# Patient Record
Sex: Male | Born: 1935
Health system: Southern US, Community
[De-identification: ages and names within clinical notes are randomized; demographics above are authoritative.]

## PROBLEM LIST (undated history)

## (undated) DIAGNOSIS — G4730 Sleep apnea, unspecified: Secondary | ICD-10-CM

## (undated) DIAGNOSIS — M545 Low back pain, unspecified: Secondary | ICD-10-CM

## (undated) DIAGNOSIS — E7800 Pure hypercholesterolemia, unspecified: Secondary | ICD-10-CM

## (undated) DIAGNOSIS — I10 Essential (primary) hypertension: Secondary | ICD-10-CM

## (undated) DIAGNOSIS — I5022 Chronic systolic (congestive) heart failure: Secondary | ICD-10-CM

## (undated) DIAGNOSIS — G8929 Other chronic pain: Secondary | ICD-10-CM

## (undated) DIAGNOSIS — J449 Chronic obstructive pulmonary disease, unspecified: Secondary | ICD-10-CM

## (undated) DIAGNOSIS — R51 Headache: Secondary | ICD-10-CM

## (undated) DIAGNOSIS — Z720 Tobacco use: Secondary | ICD-10-CM

## (undated) DIAGNOSIS — IMO0002 Reserved for concepts with insufficient information to code with codable children: Secondary | ICD-10-CM

## (undated) DIAGNOSIS — K8020 Calculus of gallbladder without cholecystitis without obstruction: Secondary | ICD-10-CM

## (undated) DIAGNOSIS — M1990 Unspecified osteoarthritis, unspecified site: Secondary | ICD-10-CM

## (undated) DIAGNOSIS — E871 Hypo-osmolality and hyponatremia: Secondary | ICD-10-CM

## (undated) DIAGNOSIS — I4821 Permanent atrial fibrillation: Secondary | ICD-10-CM

## (undated) DIAGNOSIS — I110 Hypertensive heart disease with heart failure: Secondary | ICD-10-CM

## (undated) DIAGNOSIS — F419 Anxiety disorder, unspecified: Secondary | ICD-10-CM

## (undated) DIAGNOSIS — N182 Chronic kidney disease, stage 2 (mild): Secondary | ICD-10-CM

## (undated) DIAGNOSIS — I2510 Atherosclerotic heart disease of native coronary artery without angina pectoris: Secondary | ICD-10-CM

## (undated) DIAGNOSIS — E039 Hypothyroidism, unspecified: Secondary | ICD-10-CM

## (undated) DIAGNOSIS — I071 Rheumatic tricuspid insufficiency: Secondary | ICD-10-CM

## (undated) DIAGNOSIS — N529 Male erectile dysfunction, unspecified: Secondary | ICD-10-CM

## (undated) DIAGNOSIS — R519 Headache, unspecified: Secondary | ICD-10-CM

## (undated) HISTORY — DX: Tobacco use: Z72.0

## (undated) HISTORY — PX: TONSILLECTOMY: SUR1361

## (undated) HISTORY — DX: Permanent atrial fibrillation: I48.21

## (undated) HISTORY — DX: Rheumatic tricuspid insufficiency: I07.1

## (undated) HISTORY — DX: Chronic obstructive pulmonary disease, unspecified: J44.9

## (undated) HISTORY — DX: Chronic systolic (congestive) heart failure: I50.22

## (undated) HISTORY — DX: Hypothyroidism, unspecified: E03.9

## (undated) HISTORY — PX: CATARACT EXTRACTION, BILATERAL: SHX1313

## (undated) HISTORY — DX: Sleep apnea, unspecified: G47.30

## (undated) HISTORY — DX: Chronic kidney disease, stage 2 (mild): N18.2

## (undated) HISTORY — DX: Pure hypercholesterolemia, unspecified: E78.00

## (undated) HISTORY — DX: Hypertensive heart disease with heart failure: I11.0

## (undated) HISTORY — PX: APPENDECTOMY: SHX54

## (undated) HISTORY — DX: Hypo-osmolality and hyponatremia: E87.1

## (undated) HISTORY — DX: Calculus of gallbladder without cholecystitis without obstruction: K80.20

## (undated) HISTORY — DX: Essential (primary) hypertension: I10

## (undated) HISTORY — DX: Atherosclerotic heart disease of native coronary artery without angina pectoris: I25.10

## (undated) HISTORY — DX: Male erectile dysfunction, unspecified: N52.9

## (undated) HISTORY — DX: Reserved for concepts with insufficient information to code with codable children: IMO0002

---

## 1985-05-01 HISTORY — PX: CARDIAC CATHETERIZATION: SHX172

## 1997-09-09 ENCOUNTER — Other Ambulatory Visit: Admission: RE | Admit: 1997-09-09 | Discharge: 1997-09-09 | Payer: Self-pay | Admitting: Cardiology

## 2003-11-11 ENCOUNTER — Ambulatory Visit (HOSPITAL_COMMUNITY): Admission: RE | Admit: 2003-11-11 | Discharge: 2003-11-11 | Payer: Self-pay | Admitting: Gastroenterology

## 2005-11-15 ENCOUNTER — Encounter: Admission: RE | Admit: 2005-11-15 | Discharge: 2005-11-15 | Payer: Self-pay | Admitting: Cardiology

## 2005-11-23 ENCOUNTER — Encounter: Admission: RE | Admit: 2005-11-23 | Discharge: 2005-11-23 | Payer: Self-pay | Admitting: Cardiology

## 2006-04-20 ENCOUNTER — Encounter: Admission: RE | Admit: 2006-04-20 | Discharge: 2006-04-20 | Payer: Self-pay | Admitting: Cardiology

## 2008-02-11 ENCOUNTER — Encounter: Admission: RE | Admit: 2008-02-11 | Discharge: 2008-02-11 | Payer: Self-pay | Admitting: Cardiology

## 2008-12-30 DIAGNOSIS — E871 Hypo-osmolality and hyponatremia: Secondary | ICD-10-CM

## 2008-12-30 HISTORY — DX: Hypo-osmolality and hyponatremia: E87.1

## 2009-01-24 ENCOUNTER — Inpatient Hospital Stay (HOSPITAL_COMMUNITY): Admission: EM | Admit: 2009-01-24 | Discharge: 2009-01-26 | Payer: Self-pay | Admitting: Emergency Medicine

## 2009-01-25 ENCOUNTER — Encounter (INDEPENDENT_AMBULATORY_CARE_PROVIDER_SITE_OTHER): Payer: Self-pay | Admitting: Emergency Medicine

## 2009-06-29 ENCOUNTER — Encounter: Admission: RE | Admit: 2009-06-29 | Discharge: 2009-06-29 | Payer: Self-pay | Admitting: Cardiology

## 2009-12-16 ENCOUNTER — Ambulatory Visit: Payer: Self-pay | Admitting: Cardiology

## 2009-12-20 ENCOUNTER — Ambulatory Visit: Payer: Self-pay | Admitting: Cardiology

## 2010-03-23 ENCOUNTER — Ambulatory Visit: Payer: Self-pay | Admitting: Cardiology

## 2010-07-20 ENCOUNTER — Other Ambulatory Visit: Payer: Self-pay

## 2010-07-22 ENCOUNTER — Ambulatory Visit: Payer: Self-pay | Admitting: Cardiology

## 2010-08-01 ENCOUNTER — Other Ambulatory Visit (INDEPENDENT_AMBULATORY_CARE_PROVIDER_SITE_OTHER): Payer: Medicare Other | Admitting: *Deleted

## 2010-08-01 DIAGNOSIS — E7800 Pure hypercholesterolemia, unspecified: Secondary | ICD-10-CM

## 2010-08-01 LAB — COMPREHENSIVE METABOLIC PANEL
Albumin: 3.6 g/dL (ref 3.5–5.2)
Alkaline Phosphatase: 82 U/L (ref 39–117)
BUN: 26 mg/dL — ABNORMAL HIGH (ref 6–23)
Creatinine, Ser: 1 mg/dL (ref 0.4–1.5)
Glucose, Bld: 90 mg/dL (ref 70–99)
Total Bilirubin: 0.8 mg/dL (ref 0.3–1.2)

## 2010-08-01 LAB — LIPID PANEL
Cholesterol: 151 mg/dL (ref 0–200)
HDL: 42.4 mg/dL (ref >39.00)
Total CHOL/HDL Ratio: 4
Triglycerides: 104 mg/dL (ref 0.0–149.0)

## 2010-08-02 ENCOUNTER — Encounter: Payer: Self-pay | Admitting: Cardiology

## 2010-08-03 ENCOUNTER — Ambulatory Visit (INDEPENDENT_AMBULATORY_CARE_PROVIDER_SITE_OTHER): Payer: Medicare Other | Admitting: Cardiology

## 2010-08-03 ENCOUNTER — Encounter: Payer: Self-pay | Admitting: Cardiology

## 2010-08-03 DIAGNOSIS — K8020 Calculus of gallbladder without cholecystitis without obstruction: Secondary | ICD-10-CM | POA: Insufficient documentation

## 2010-08-03 DIAGNOSIS — I259 Chronic ischemic heart disease, unspecified: Secondary | ICD-10-CM

## 2010-08-03 DIAGNOSIS — N529 Male erectile dysfunction, unspecified: Secondary | ICD-10-CM | POA: Insufficient documentation

## 2010-08-03 DIAGNOSIS — I119 Hypertensive heart disease without heart failure: Secondary | ICD-10-CM

## 2010-08-03 DIAGNOSIS — Z720 Tobacco use: Secondary | ICD-10-CM | POA: Insufficient documentation

## 2010-08-03 DIAGNOSIS — M545 Low back pain, unspecified: Secondary | ICD-10-CM

## 2010-08-03 DIAGNOSIS — I110 Hypertensive heart disease with heart failure: Secondary | ICD-10-CM | POA: Insufficient documentation

## 2010-08-03 DIAGNOSIS — F17200 Nicotine dependence, unspecified, uncomplicated: Secondary | ICD-10-CM

## 2010-08-03 DIAGNOSIS — E7800 Pure hypercholesterolemia, unspecified: Secondary | ICD-10-CM | POA: Insufficient documentation

## 2010-08-03 NOTE — Assessment & Plan Note (Signed)
He was recently found to have asymptomatic cholelithiasis on her recent hospitalization at [facility] when he had a CT angiogram of the chest.  He was found to have emphysema.  He was also noted to have extensive coronary artery calcification.

## 2010-08-03 NOTE — Assessment & Plan Note (Signed)
The patient continues to smoke a pack of cigarettes a day.  Unfortunately he is smoking more now that he is retired.  He does have symptoms of leg weakness and easy fatigue which could be directly related to his long history of tobacco abuse.  We talked about ways to quit smoking he will consider the Nicorette gum or nicotine patches.

## 2010-08-03 NOTE — Assessment & Plan Note (Signed)
The patient has a history of remote inferior wall MI.  He has not been experiencing any chest pain to suggest angina pectoris.  He has not been expressing any symptoms of congestive heart failure.

## 2010-08-03 NOTE — Progress Notes (Signed)
History of Present Illness: This 77 year old gentleman is seen for a scheduled followup office visit.  He has a history of known ischemic heart disease with a previous inferior wall myocardial infarction.  His last nuclear stress test was 11/16/04 which showed an old inferolateral scar but no reversible ischemia and his ejection fraction was 41%.  The patient had an echocardiogram on 01/25/09 when he was hospitalized at [facility] and the echocardiogram showed an ejection fraction of 40-45% with hypokinesis of the inferior myocardium as well as grade 1 diastolic dysfunction and moderate left atrial dilatation.  Current Outpatient Prescriptions  Medication Sig Dispense Refill  . Acetaminophen (TYLENOL PO) Take by mouth as needed.        Marland Kitchen amLODipine (NORVASC) 5 MG tablet Take 5 mg by mouth daily.       Marland Kitchen aspirin 162 MG EC tablet Take 162 mg by mouth daily.        Marland Kitchen atorvastatin (LIPITOR) 10 MG tablet Take 10 mg by mouth daily.       . hydrochlorothiazide (,MICROZIDE/HYDRODIURIL,) 12.5 MG capsule Take 12.5 mg by mouth every other day.       . Ibuprofen (ADVIL PO) Take by mouth as needed.        Marland Kitchen lisinopril (PRINIVIL,ZESTRIL) 40 MG tablet Take 40 mg by mouth daily.       Marland Kitchen LORazepam (ATIVAN) 0.5 MG tablet Take 0.5 mg by mouth daily.        . metoprolol tartrate (LOPRESSOR) 25 MG tablet Take 25 mg by mouth 2 (two) times daily.       . Naproxen Sodium (ALEVE PO) Take by mouth as needed.        . Sildenafil Citrate (VIAGRA PO) Take by mouth as needed.          Allergies  Allergen Reactions  . Ceclor (Cefaclor)   . Erythromycin     Patient Active Problem List  Diagnoses  . Ischemic heart disease  . Tobacco abuse  . Benign hypertensive heart disease without heart failure  . Hypercholesterolemia  . Erectile dysfunction  . Low back pain  . Cholelithiasis    History  Smoking status  . Current Everyday Smoker -- 1.0 packs/day  Smokeless tobacco  . Not on file    History    Alcohol Use  . Yes    No family history on file.  Review of Systems: Constitutional: no fever chills diaphoresis or fatigue or change in weight.  Head and neck: no hearing loss, no epistaxis, no photophobia or visual disturbance. Respiratory: The patient does have a smoker's cough and a lot of congestion and phlegm Cardiovascular: No chest pain peripheral edema, palpitations. Gastrointestinal: No abdominal distention, no abdominal pain, no change in bowel habits hematochezia or melena. Genitourinary: No dysuria, no frequency, no urgency, no nocturia. Musculoskeletal:No arthralgias, no back pain, no gait disturbance or myalgias. Neurological: No dizziness, no headaches, no numbness, no seizures, no syncope, no weakness, no tremors. Hematologic: No lymphadenopathy, no easy bruising. Psychiatric: No confusion, no hallucinations, no sleep disturbance.    Physical Exam: Filed Vitals:   08/03/10 1142  BP: 136/78  Pulse: 64  His weight is 153, down 3 pounds.  The general appearance reveals a well-developed well-nourished gentleman in no distress.  He has some kyphosis of the spine.Pupils equal and reactive.   Extraocular Movements are full.  There is no scleral icterus.  The mouth and pharynx are normal.  The neck is supple.  The carotids reveal no  bruits.  The jugular venous pressure is normal.  The thyroid is not enlarged.  There is no lymphadenopathy.The chest is clear to percussion and auscultation. There are no rales or rhonchi. Expansion of the chest is symmetrical.The precordium is quiet.  The first heart sound is normal.  The second heart sound is physiologically split.  There is no murmur gallop rub or click.  There is no abnormal lift or heave.The abdomen is soft and nontender. Bowel sounds are normal. The liver and spleen are not enlarged. There Are no abdominal masses. There are no bruits.  Extremities revealed decreased pedal pulses.  No edema or phlebitis.   Assessment /  Plan: Patient is to work harder on quitting smoking.  Continue same meds

## 2010-08-03 NOTE — Assessment & Plan Note (Signed)
His urologist has told him that his smoking is a contributory factor to his ED problem and I concur with that.

## 2010-08-03 NOTE — Assessment & Plan Note (Signed)
We reviewed his blood work from 2 days ago which shows adequate response to Lipitor.  Continue same medication.

## 2010-08-03 NOTE — Assessment & Plan Note (Signed)
No headaches or dizzy spells.  No syncope.  No palpitations.

## 2010-08-05 LAB — CBC
Hemoglobin: 12.7 g/dL — ABNORMAL LOW (ref 13.0–17.0)
Hemoglobin: 13.4 g/dL (ref 13.0–17.0)
Hemoglobin: 13.6 g/dL (ref 13.0–17.0)
Hemoglobin: 13.7 g/dL (ref 13.0–17.0)
MCHC: 34.4 g/dL (ref 30.0–36.0)
MCHC: 34.5 g/dL (ref 30.0–36.0)
MCHC: 35 g/dL (ref 30.0–36.0)
MCV: 104.3 fL — ABNORMAL HIGH (ref 78.0–100.0)
Platelets: 281 10*3/uL (ref 150–400)
RBC: 3.51 MIL/uL — ABNORMAL LOW (ref 4.22–5.81)
RBC: 3.74 MIL/uL — ABNORMAL LOW (ref 4.22–5.81)
RBC: 3.78 MIL/uL — ABNORMAL LOW (ref 4.22–5.81)
RBC: 3.83 MIL/uL — ABNORMAL LOW (ref 4.22–5.81)
RDW: 13.5 % (ref 11.5–15.5)
WBC: 12.4 10*3/uL — ABNORMAL HIGH (ref 4.0–10.5)
WBC: 13 10*3/uL — ABNORMAL HIGH (ref 4.0–10.5)
WBC: 16.3 10*3/uL — ABNORMAL HIGH (ref 4.0–10.5)

## 2010-08-05 LAB — DIFFERENTIAL
Basophils Absolute: 0 10*3/uL (ref 0.0–0.1)
Basophils Relative: 0 % (ref 0–1)
Basophils Relative: 0 % (ref 0–1)
Eosinophils Absolute: 0 10*3/uL (ref 0.0–0.7)
Eosinophils Relative: 0 % (ref 0–5)
Lymphocytes Relative: 13 % (ref 12–46)
Lymphocytes Relative: 16 % (ref 12–46)
Lymphs Abs: 1.6 10*3/uL (ref 0.7–4.0)
Lymphs Abs: 1.9 10*3/uL (ref 0.7–4.0)
Monocytes Absolute: 1.1 10*3/uL — ABNORMAL HIGH (ref 0.1–1.0)
Monocytes Relative: 9 % (ref 3–12)
Neutro Abs: 9.3 10*3/uL — ABNORMAL HIGH (ref 1.7–7.7)
Neutro Abs: 9.8 10*3/uL — ABNORMAL HIGH (ref 1.7–7.7)
Neutrophils Relative %: 72 % (ref 43–77)
Neutrophils Relative %: 76 % (ref 43–77)

## 2010-08-05 LAB — URINALYSIS, ROUTINE W REFLEX MICROSCOPIC
Bilirubin Urine: NEGATIVE
Glucose, UA: NEGATIVE mg/dL
Hgb urine dipstick: NEGATIVE
Ketones, ur: NEGATIVE mg/dL
Protein, ur: NEGATIVE mg/dL
Specific Gravity, Urine: 1.012 (ref 1.005–1.030)

## 2010-08-05 LAB — BASIC METABOLIC PANEL
CO2: 23 mEq/L (ref 19–32)
CO2: 26 mEq/L (ref 19–32)
CO2: 27 mEq/L (ref 19–32)
Calcium: 8.9 mg/dL (ref 8.4–10.5)
Chloride: 95 mEq/L — ABNORMAL LOW (ref 96–112)
Creatinine, Ser: 0.95 mg/dL (ref 0.4–1.5)
GFR calc Af Amer: >60 mL/min (ref >60)
GFR calc Af Amer: >60 mL/min (ref >60)
Glucose, Bld: 118 mg/dL — ABNORMAL HIGH (ref 70–99)
Potassium: 4.7 mEq/L (ref 3.5–5.1)
Sodium: 125 mEq/L — ABNORMAL LOW (ref 135–145)
Sodium: 128 mEq/L — ABNORMAL LOW (ref 135–145)
Sodium: 130 mEq/L — ABNORMAL LOW (ref 135–145)

## 2010-08-05 LAB — TSH: TSH: 1.075 u[IU]/mL (ref 0.350–4.500)

## 2010-08-05 LAB — D-DIMER, QUANTITATIVE: D-Dimer, Quant: 0.56 ug/mL-FEU — ABNORMAL HIGH (ref 0.00–0.48)

## 2010-08-05 LAB — CARDIAC PANEL(CRET KIN+CKTOT+MB+TROPI)
CK, MB: 1.4 ng/mL (ref 0.3–4.0)
CK, MB: 1.5 ng/mL (ref 0.3–4.0)
Relative Index: INVALID (ref 0.0–2.5)
Relative Index: INVALID (ref 0.0–2.5)
Total CK: 32 U/L (ref 7–232)
Total CK: 33 U/L (ref 7–232)
Troponin I: 0.01 ng/mL (ref 0.00–0.06)
Troponin I: 0.02 ng/mL (ref 0.00–0.06)

## 2010-08-05 LAB — BRAIN NATRIURETIC PEPTIDE: Pro B Natriuretic peptide (BNP): 246 pg/mL — ABNORMAL HIGH (ref 0.0–100.0)

## 2010-08-05 LAB — COMPREHENSIVE METABOLIC PANEL
ALT: 35 U/L (ref 0–53)
BUN: 21 mg/dL (ref 6–23)
GFR calc Af Amer: >60 mL/min (ref >60)
GFR calc non Af Amer: >60 mL/min (ref >60)
Glucose, Bld: 135 mg/dL — ABNORMAL HIGH (ref 70–99)
Potassium: 3.2 mEq/L — ABNORMAL LOW (ref 3.5–5.1)
Total Protein: 6 g/dL (ref 6.0–8.3)

## 2010-08-05 LAB — LIPID PANEL
Cholesterol: 144 mg/dL (ref 0–200)
HDL: 45 mg/dL (ref >39)

## 2010-08-05 LAB — PHOSPHORUS: Phosphorus: 2.9 mg/dL (ref 2.3–4.6)

## 2010-08-05 LAB — CORTISOL: Cortisol, Plasma: 3.3 ug/dL

## 2010-08-05 LAB — HEMOGLOBIN A1C
Hgb A1c MFr Bld: 5.7 % (ref 4.6–6.1)
Mean Plasma Glucose: 117 mg/dL

## 2010-08-05 LAB — OSMOLALITY
Osmolality: 264 mOsm/kg — ABNORMAL LOW (ref 275–300)
Osmolality: 277 mOsm/kg (ref 275–300)

## 2010-08-05 LAB — SODIUM, URINE, RANDOM: Sodium, Ur: 95 mEq/L

## 2010-08-05 LAB — POCT CARDIAC MARKERS: Myoglobin, poc: 45 ng/mL (ref 12–200)

## 2010-08-18 ENCOUNTER — Telehealth: Payer: Self-pay | Admitting: Cardiology

## 2010-08-25 ENCOUNTER — Telehealth: Payer: Self-pay | Admitting: Cardiology

## 2010-08-25 NOTE — Telephone Encounter (Signed)
Mr Christopher Butler [phone] ext [phone] [employer] wants to check status of Medical Necessity paperwork for Dr Patty Sermons

## 2010-08-25 NOTE — Telephone Encounter (Signed)
Left message that it is in Dr [place] office and would fax back when complete

## 2010-09-16 NOTE — Op Note (Signed)
NAME:  Christopher Butler, Christopher Butler                         ACCOUNT NO.:  [id number]   MEDICAL RECORD NO.:  [id number]                   PATIENT TYPE:  AMB   LOCATION:  ENDO                                 FACILITY:  [facility]   PHYSICIAN:  John C. Madilyn Fireman, M.D.                 DATE OF BIRTH:  Aug 07, 1924   DATE OF PROCEDURE:  11/11/2003  DATE OF DISCHARGE:                                 OPERATIVE REPORT   PROCEDURE:  Colonoscopy.   INDICATIONS FOR PROCEDURE:  Colon cancer screening and left lower quadrant  abdominal pain.   DESCRIPTION OF PROCEDURE:  The patient was placed in the left lateral  decubitus position then placed on the pulse monitor with continuous low flow  oxygen delivered by nasal cannula. He was sedated with 62.5 mcg IV fentanyl  and 6 mg IV Versed. The Olympus video colonoscope was inserted into the  rectum and advanced to the cecum, confirmed by transillumination at  McBurney's point and visualization of the ileocecal valve and appendiceal  orifice. The prep was excellent. The cecum, ascending, transverse,  descending and sigmoid colon all appeared normal with no masses, polyps,  diverticula or other mucosal abnormalities. The rectum likewise appeared  normal and retroflexed view of the anus revealed no obvious internal  hemorrhoids. The scope was then withdrawn and the patient returned to the  recovery room in stable condition. He tolerated the procedure well and there  were no immediate complications.   IMPRESSION:  Normal colonoscopy.   PLAN:  Next colon screening by sigmoidoscopy in five years.                                               John C. Madilyn Fireman, M.D.    [facility]  D:  11/11/2003  T:  11/11/2003  Job:  [id number]   cc:   Cassell Clement, M.D.  [address], [address]  [place]  [place] [address]  Fax: [phone]

## 2010-09-27 ENCOUNTER — Telehealth: Payer: Self-pay | Admitting: Cardiology

## 2010-09-27 NOTE — Telephone Encounter (Signed)
PT NOT SURE IF HIS BP NEEDS TO BE ADJUSTED, HIS BP HAS BEEN RUNNING LOW. CHART IN BOX.

## 2010-09-27 NOTE — Telephone Encounter (Addendum)
Returned pt call concerning low BP. He reports reading of 110/65 @ 10am today. He states he quit smoking 4 weeks ago. Feels tired and like his equilibrium is off. He wonders if his meds need adjusted since he quit smoking. Reviewed current meds with pt. Will discuss with Dr Patty Sermons and return call to him regarding meds. 1700 returned call to pt after discussing pt situation with Dr Patty Sermons. Pt will decrease metoprolol to 25mg  daily and Lisinopril to be decreased to 20mg  daily pt will be taking 1/2 a tablet of his 40mg  tablets.

## 2010-11-29 NOTE — Telephone Encounter (Signed)
No note necessary for this encounter. °

## 2010-11-30 ENCOUNTER — Ambulatory Visit (INDEPENDENT_AMBULATORY_CARE_PROVIDER_SITE_OTHER): Payer: Medicare Other | Admitting: *Deleted

## 2010-11-30 ENCOUNTER — Other Ambulatory Visit: Payer: Medicare Other | Admitting: *Deleted

## 2010-11-30 DIAGNOSIS — E7800 Pure hypercholesterolemia, unspecified: Secondary | ICD-10-CM

## 2010-11-30 LAB — BASIC METABOLIC PANEL
BUN: 21 mg/dL (ref 6–23)
CO2: 26 mEq/L (ref 19–32)
Chloride: 97 mEq/L (ref 96–112)
Glucose, Bld: 100 mg/dL — ABNORMAL HIGH (ref 70–99)
Potassium: 4.2 mEq/L (ref 3.5–5.1)
Sodium: 132 mEq/L — ABNORMAL LOW (ref 135–145)

## 2010-11-30 LAB — CBC WITH DIFFERENTIAL/PLATELET
Basophils Relative: 0.4 % (ref 0.0–3.0)
Eosinophils Absolute: 0 10*3/uL (ref 0.0–0.7)
HCT: 39 % (ref 39.0–52.0)
Lymphs Abs: 2.4 10*3/uL (ref 0.7–4.0)
MCHC: 33 g/dL (ref 30.0–36.0)
MCV: 105.1 fl — ABNORMAL HIGH (ref 78.0–100.0)
Monocytes Absolute: 0.9 10*3/uL (ref 0.1–1.0)
Neutro Abs: 4.5 10*3/uL (ref 1.4–7.7)
Neutrophils Relative %: 57.2 % (ref 43.0–77.0)
RBC: 3.71 Mil/uL — ABNORMAL LOW (ref 4.22–5.81)

## 2010-11-30 LAB — LIPID PANEL
HDL: 66.1 mg/dL (ref >39.00)
LDL Cholesterol: 80 mg/dL (ref 0–99)
Total CHOL/HDL Ratio: 3
VLDL: 27 mg/dL (ref 0.0–40.0)

## 2010-12-01 LAB — HEPATIC FUNCTION PANEL
ALT: 27 U/L (ref 0–53)
Bilirubin, Direct: 0 mg/dL (ref 0.0–0.3)
Total Bilirubin: 0.5 mg/dL (ref 0.3–1.2)

## 2010-12-02 ENCOUNTER — Encounter: Payer: Self-pay | Admitting: Cardiology

## 2010-12-02 ENCOUNTER — Ambulatory Visit (INDEPENDENT_AMBULATORY_CARE_PROVIDER_SITE_OTHER): Payer: Medicare Other | Admitting: Cardiology

## 2010-12-02 VITALS — BP 136/80 | HR 80 | Wt 155.0 lb

## 2010-12-02 DIAGNOSIS — I259 Chronic ischemic heart disease, unspecified: Secondary | ICD-10-CM

## 2010-12-02 DIAGNOSIS — I119 Hypertensive heart disease without heart failure: Secondary | ICD-10-CM

## 2010-12-02 DIAGNOSIS — Z720 Tobacco use: Secondary | ICD-10-CM

## 2010-12-02 DIAGNOSIS — E7800 Pure hypercholesterolemia, unspecified: Secondary | ICD-10-CM

## 2010-12-02 DIAGNOSIS — F17200 Nicotine dependence, unspecified, uncomplicated: Secondary | ICD-10-CM

## 2010-12-02 MED ORDER — LISINOPRIL 40 MG PO TABS: 40.0000 mg | ORAL_TABLET | Freq: Every day | ORAL | Status: DC

## 2010-12-02 NOTE — Assessment & Plan Note (Signed)
The patient quit smoking in May 2012 and has remained a nonsmoker except for about 3 days in the beginning of July when he smoked briefly

## 2010-12-02 NOTE — Assessment & Plan Note (Signed)
No symptoms of angina pectoris or congestive heart failure.  His most recent echocardiogram was on 01/25/09 and showed an ejection fraction of 40-45% with inferior wall hypokinesis and moderate left atrial dilatation he also had abnormal left ventricular relaxation grade 1 diastolic dysfunction by echo.

## 2010-12-02 NOTE — Patient Instructions (Signed)
Add Mucinex 600 for chest congestion

## 2010-12-02 NOTE — Progress Notes (Signed)
Christopher Butler Date of Birth:  [birth year] [facility] / [facility] [address]   [address] [place], [place]  [address] [phone]           Fax   [phone]  History of Present Illness: This pleasant 77 year old gentleman is seen for a four-month followup office visit.  He has a complex past medical history he has a history of a remote inferior wall myocardial infarction.  He had cardiac catheterization on 09/15/85.  At that time he was treated with streptokinase therapy.  He recannulized nicely and did not require angioplasty there was posterior inferior hypokinesia noted at the time of catheter.  He has not been experiencing any recurrent angina pectoris.  He does have shortness of breath but had been a heavy smoker until recently when he quit smoking in May 2012.  He did smoke briefly for 3 days over the Fourth of July holidays but otherwise has not smoked.  He does feel like he has a lot of chest congestion which he feels is actually worse since he quit smoking.  He has not been taking any Humabid and we recommended that he try some.  Current Outpatient Prescriptions  Medication Sig Dispense Refill  . Acetaminophen (TYLENOL PO) Take by mouth as needed.        Marland Kitchen amLODipine (NORVASC) 5 MG tablet Take 5 mg by mouth daily.       Marland Kitchen aspirin 162 MG EC tablet Take 162 mg by mouth daily.       Marland Kitchen atorvastatin (LIPITOR) 10 MG tablet Take 10 mg by mouth daily.       . hydrochlorothiazide (,MICROZIDE/HYDRODIURIL,) 12.5 MG capsule Take 12.5 mg by mouth every other day.       . Ibuprofen (ADVIL PO) Take by mouth as needed.        Marland Kitchen lisinopril (PRINIVIL,ZESTRIL) 40 MG tablet Take 1 tablet (40 mg total) by mouth daily. Take 1/2 every other day  90 tablet  3  . LORazepam (ATIVAN) 0.5 MG tablet Take 0.5 mg by mouth daily.        . metoprolol tartrate (LOPRESSOR) 25 MG tablet Take 25 mg by mouth daily.       . Naproxen Sodium (ALEVE PO) Take by mouth as needed.        . Sildenafil Citrate  (VIAGRA PO) Take by mouth as needed.          Allergies  Allergen Reactions  . Ceclor (Cefaclor)   . Erythromycin     Patient Active Problem List  Diagnoses  . Ischemic heart disease  . Tobacco abuse  . Benign hypertensive heart disease without heart failure  . Hypercholesterolemia  . Erectile dysfunction  . Low back pain  . Cholelithiasis    History  Smoking status  . Former Smoker -- 1.0 packs/day  Smokeless tobacco  . Not on file    History  Alcohol Use  . Yes    No family history on file.  Review of Systems: Constitutional: no fever chills diaphoresis or fatigue or change in weight.  Head and neck: no hearing loss, no epistaxis, no photophobia or visual disturbance. Respiratory: No cough, shortness of breath or wheezing. Cardiovascular: No chest pain peripheral edema, palpitations. Gastrointestinal: No abdominal distention, no abdominal pain, no change in bowel habits hematochezia or melena. Genitourinary: No dysuria, no frequency, no urgency, no nocturia. Musculoskeletal:No arthralgias, no back pain, no gait disturbance or myalgias. Neurological: No dizziness, no headaches, no numbness, no seizures,  no syncope, no weakness, no tremors. Hematologic: No lymphadenopathy, no easy bruising. Psychiatric: No confusion, no hallucinations, no sleep disturbance.    Physical Exam: Filed Vitals:   12/02/10 1342  BP: 136/80  Pulse: 80  The general appearance reveals a elderly gentleman in no acute distress.  Normal head and neck.Pupils equal and reactive.   Extraocular Movements are full.  There is no scleral icterus.  The mouth and pharynx are normal.  The neck is supple.  The carotids reveal no bruits.  The jugular venous pressure is normal.  The thyroid is not enlarged.  There is no lymphadenopathy.  The chest is clear to percussion and auscultation. There are no rales or rhonchi. Expansion of the chest is symmetrical.  The precordium is quiet.  The first heart  sound is normal.  The second heart sound is physiologically split.  There is no murmur gallop rub or click.  There is no abnormal lift or heave.  The abdomen is soft and nontender. Bowel sounds are normal. The liver and spleen are not enlarged. There Are no abdominal masses. There are no bruits.   The pedal pulses are good.  There is no phlebitis or edema.  There is no cyanosis or clubbing.  Strength is normal and symmetrical in all extremities.  There is no lateralizing weakness.  There are no sensory deficits.  The skin is warm and dry.  There is no rash.     Assessment / Plan: Add Humibid for expectoration now that he is no longer smoking and has a lot of sensation of phlegm stuck in his chest.  Continue other medicines the same and be rechecked in 4 months for followup office visit and fasting lab work. The patient needs to have a primary care provider and he will contact Dr. Felipa Eth who is his wife's physician.

## 2010-12-02 NOTE — Assessment & Plan Note (Signed)
The patient is on low dose statin therapy and is tolerating it without side effects.  His most recent lipid values are acceptable.  He is to continue same regimen.

## 2010-12-05 ENCOUNTER — Other Ambulatory Visit: Payer: Self-pay | Admitting: *Deleted

## 2010-12-05 DIAGNOSIS — E7800 Pure hypercholesterolemia, unspecified: Secondary | ICD-10-CM

## 2010-12-26 ENCOUNTER — Telehealth: Payer: Self-pay | Admitting: Cardiology

## 2010-12-26 NOTE — Telephone Encounter (Signed)
Pam at [employer] to know the status of her request for patient to have diabetic supplies.  She said that she faxed the request the first week of August.

## 2010-12-27 NOTE — Telephone Encounter (Signed)
Dr. Patty Sermons reviewed patients chart and states he is not diabetic, only one blood sugar borderline at 100.  Claria Dice and faxed signed information back.

## 2011-03-16 ENCOUNTER — Telehealth: Payer: Self-pay | Admitting: Cardiology

## 2011-03-16 NOTE — Telephone Encounter (Signed)
Returned call to [facility] for refill on ativan and refilled three times

## 2011-03-16 NOTE — Telephone Encounter (Signed)
[facility] is calling for [facility] of lorazepam Please call back

## 2011-04-19 ENCOUNTER — Other Ambulatory Visit (INDEPENDENT_AMBULATORY_CARE_PROVIDER_SITE_OTHER): Payer: Medicare Other | Admitting: *Deleted

## 2011-04-19 DIAGNOSIS — E7800 Pure hypercholesterolemia, unspecified: Secondary | ICD-10-CM

## 2011-04-19 LAB — BASIC METABOLIC PANEL
Chloride: 106 mEq/L (ref 96–112)
GFR: 54.34 mL/min — ABNORMAL LOW (ref >60.00)
Potassium: 4.1 mEq/L (ref 3.5–5.1)

## 2011-04-19 LAB — LIPID PANEL
LDL Cholesterol: 73 mg/dL (ref 0–99)
Total CHOL/HDL Ratio: 3
Triglycerides: 168 mg/dL — ABNORMAL HIGH (ref 0.0–149.0)
VLDL: 33.6 mg/dL (ref 0.0–40.0)

## 2011-04-19 LAB — CBC WITH DIFFERENTIAL/PLATELET
Basophils Absolute: 0 10*3/uL (ref 0.0–0.1)
Eosinophils Absolute: 0.1 10*3/uL (ref 0.0–0.7)
Lymphocytes Relative: 32.6 % (ref 12.0–46.0)
MCHC: 33.5 g/dL (ref 30.0–36.0)
Neutrophils Relative %: 53.6 % (ref 43.0–77.0)
Platelets: 217 10*3/uL (ref 150.0–400.0)
RDW: 12.9 % (ref 11.5–14.6)

## 2011-04-19 LAB — HEPATIC FUNCTION PANEL
ALT: 17 U/L (ref 0–53)
AST: 19 U/L (ref 0–37)
Bilirubin, Direct: 0 mg/dL (ref 0.0–0.3)
Total Bilirubin: 0.9 mg/dL (ref 0.3–1.2)

## 2011-04-21 ENCOUNTER — Ambulatory Visit (INDEPENDENT_AMBULATORY_CARE_PROVIDER_SITE_OTHER): Payer: Medicare Other | Admitting: Cardiology

## 2011-04-21 ENCOUNTER — Encounter: Payer: Self-pay | Admitting: Cardiology

## 2011-04-21 VITALS — BP 130/88 | HR 60 | Ht 66.0 in | Wt 160.0 lb

## 2011-04-21 DIAGNOSIS — I259 Chronic ischemic heart disease, unspecified: Secondary | ICD-10-CM

## 2011-04-21 DIAGNOSIS — I2510 Atherosclerotic heart disease of native coronary artery without angina pectoris: Secondary | ICD-10-CM

## 2011-04-21 DIAGNOSIS — I119 Hypertensive heart disease without heart failure: Secondary | ICD-10-CM

## 2011-04-21 DIAGNOSIS — Z720 Tobacco use: Secondary | ICD-10-CM

## 2011-04-21 DIAGNOSIS — F17200 Nicotine dependence, unspecified, uncomplicated: Secondary | ICD-10-CM

## 2011-04-21 DIAGNOSIS — E7800 Pure hypercholesterolemia, unspecified: Secondary | ICD-10-CM

## 2011-04-21 MED ORDER — NITROGLYCERIN 0.4 MG SL SUBL: 0.4000 mg | SUBLINGUAL_TABLET | SUBLINGUAL | Status: DC | PRN

## 2011-04-21 NOTE — Assessment & Plan Note (Signed)
The patient has a history of hypercholesterolemia.  He is on atorvastatin 10 mg daily and is tolerating it well.  His recent labs have been satisfactory

## 2011-04-21 NOTE — Progress Notes (Signed)
Christopher Butler:  1924-06-26 [facility] / [facility] [address]   [address] [place], [place]  [address] [phone]           Fax   [phone]  History of Present Illness: This pleasant 82 for gentleman is seen for a scheduled followup office visit.  He has a history of ischemic heart disease and hypertensive heart disease.  As a history of a remote inferior wall myocardial infarction.  He had cardiac catheterization in 1987 and was treated acutely with streptokinase therapy and recanalized nicely and did not require angioplasty.  He's had no recurrent angina.  He has had shortness of breath but had been a heavy smoker until May 2012.  Is not having any palpitations dizziness or syncope.  Current Outpatient Prescriptions  Medication Sig Dispense Refill  . acetaminophen (TYLENOL) 500 MG tablet Take 500 mg by mouth 2 (two) times daily.        Marland Kitchen amLODipine (NORVASC) 5 MG tablet Take 5 mg by mouth daily.       Marland Kitchen aspirin 162 MG EC tablet Take 81 mg by mouth daily.       Marland Kitchen atorvastatin (LIPITOR) 10 MG tablet Take 10 mg by mouth daily.       . hydrochlorothiazide (,MICROZIDE/HYDRODIURIL,) 12.5 MG capsule Take 12.5 mg by mouth every other day.       . lisinopril (PRINIVIL,ZESTRIL) 40 MG tablet Take 1 tablet (40 mg total) by mouth daily. Take 1/2 every other day  90 tablet  3  . LORazepam (ATIVAN) 0.5 MG tablet Take 0.5 mg by mouth daily.        . metoprolol tartrate (LOPRESSOR) 25 MG tablet Take 25 mg by mouth daily.       . nitroGLYCERIN (NITROSTAT) 0.4 MG SL tablet Place 1 tablet (0.4 mg total) under the tongue every 5 (five) minutes as needed for chest pain.  25 tablet  prn    Allergies  Allergen Reactions  . Ceclor (Cefaclor)   . Erythromycin     Patient Active Problem List  Diagnoses  . Ischemic heart disease  . Tobacco abuse  . Benign hypertensive heart disease without heart failure  . Hypercholesterolemia  . Erectile dysfunction  . Low back  pain  . Cholelithiasis    History  Smoking status  . Former Smoker -- 1.0 packs/day  Smokeless tobacco  . Not on file    History  Alcohol Use  . Yes    No family history on file.  Review of Systems: Constitutional: no fever chills diaphoresis or fatigue or change in weight.  Head and neck: no hearing loss, no epistaxis, no photophobia or visual disturbance. Respiratory: No cough, shortness of breath or wheezing. Cardiovascular: No chest pain peripheral edema, palpitations. Gastrointestinal: No abdominal distention, no abdominal pain, no change in bowel habits hematochezia or melena. Genitourinary: No dysuria, no frequency, no urgency, no nocturia. Musculoskeletal:No arthralgias, no back pain, no gait disturbance or myalgias. Neurological: No dizziness, no headaches, no numbness, no seizures, no syncope, no weakness, no tremors. Hematologic: No lymphadenopathy, no easy bruising. Psychiatric: No confusion, no hallucinations, no sleep disturbance.    Physical Exam: Filed Vitals:   04/21/11 1331  BP: 130/88  Pulse: 60   the general appearance reveals a well-developed elderly gentleman in no distress.The head and neck exam reveals pupils equal and reactive.  Extraocular movements are full.  There is no scleral icterus.  The mouth and pharynx are normal.  The neck is supple.  The carotids reveal no bruits.  The jugular venous pressure is normal.  The  thyroid is not enlarged.  There is no lymphadenopathy.  The chest is clear to percussion and auscultation.  There are no rales or rhonchi.  Expansion of the chest is symmetrical.  The precordium is quiet.  The first heart sound is normal.  The second heart sound is physiologically split.  There is no murmur gallop rub or click.  There is no abnormal lift or heave.  The abdomen is soft and nontender.  The bowel sounds are normal.  The liver and spleen are not enlarged.  There are no abdominal masses.  There are no abdominal bruits.   Extremities reveal good pedal pulses.  There is no phlebitis or edema.  There is no cyanosis or clubbing.  Strength is normal and symmetrical in all extremities.  There is no lateralizing weakness.  There are no sensory deficits.  The skin is warm and dry.  There is no rash.     Assessment / Plan: Continue same medication.  Recheck in 4 months for office visit EKG and fasting lab work.  We commended him on his success in quitting smoking.

## 2011-04-21 NOTE — Patient Instructions (Signed)
Your physician recommends that you continue on your current medications as directed. Please refer to the Current Medication list given to you today. Your physician wants you to follow-up in: 4 months You will receive a reminder letter in the mail two months in advance. If you don't receive a letter, please call our office to schedule the follow-up appointment.  

## 2011-04-21 NOTE — Assessment & Plan Note (Addendum)
No recurrent chest pain or angina.  He has been expressing any palpitations.  No dizziness or syncope.  He is nitroglycerin was outdated by several years and we called the new prescription today for him to have on hand

## 2011-04-21 NOTE — Assessment & Plan Note (Signed)
No cough or sputum production.  He has not smoked for 7 months now

## 2011-05-08 ENCOUNTER — Other Ambulatory Visit: Payer: Self-pay | Admitting: Cardiology

## 2011-05-08 DIAGNOSIS — I119 Hypertensive heart disease without heart failure: Secondary | ICD-10-CM

## 2011-05-08 MED ORDER — AMLODIPINE BESYLATE 5 MG PO TABS: 5.0000 mg | ORAL_TABLET | Freq: Every day | ORAL | Status: DC

## 2011-05-08 MED ORDER — LISINOPRIL 40 MG PO TABS: 40.0000 mg | ORAL_TABLET | Freq: Every day | ORAL | Status: DC

## 2011-05-08 MED ORDER — METOPROLOL TARTRATE 25 MG PO TABS: 25.0000 mg | ORAL_TABLET | Freq: Every day | ORAL | Status: DC

## 2011-05-08 MED ORDER — HYDROCHLOROTHIAZIDE 12.5 MG PO CAPS: 12.5000 mg | ORAL_CAPSULE | ORAL | Status: DC

## 2011-05-08 MED ORDER — ATORVASTATIN CALCIUM 10 MG PO TABS: 10.0000 mg | ORAL_TABLET | Freq: Every day | ORAL | Status: DC

## 2011-05-12 ENCOUNTER — Other Ambulatory Visit: Payer: Self-pay | Admitting: *Deleted

## 2011-05-15 ENCOUNTER — Other Ambulatory Visit: Payer: Self-pay | Admitting: *Deleted

## 2011-05-23 ENCOUNTER — Telehealth: Payer: Self-pay | Admitting: Cardiology

## 2011-05-23 NOTE — Telephone Encounter (Signed)
New Problem:    Patient called in because we sent in request for three medications of his and hydrochlorothiazide (MICROZIDE) 12.5 MG capsule was the only one that was rejected and he had a question about that. Please call back.

## 2011-05-24 NOTE — Telephone Encounter (Signed)
Called Rx to pharmacy, previous one didn't go clearly

## 2011-06-28 ENCOUNTER — Telehealth: Payer: Self-pay | Admitting: Cardiology

## 2011-06-28 NOTE — Telephone Encounter (Signed)
Clarified directions on HCTZ and Lisinopril

## 2011-06-28 NOTE — Telephone Encounter (Signed)
New msg Pt wants to talk to you about hctz rx he got [facility]. Please call

## 2011-07-20 ENCOUNTER — Encounter: Payer: Self-pay | Admitting: Nurse Practitioner

## 2011-07-20 ENCOUNTER — Ambulatory Visit (INDEPENDENT_AMBULATORY_CARE_PROVIDER_SITE_OTHER): Payer: Medicare Other | Admitting: Nurse Practitioner

## 2011-07-20 VITALS — BP 126/82 | HR 74 | Ht 64.0 in | Wt 164.0 lb

## 2011-07-20 DIAGNOSIS — I259 Chronic ischemic heart disease, unspecified: Secondary | ICD-10-CM

## 2011-07-20 DIAGNOSIS — I119 Hypertensive heart disease without heart failure: Secondary | ICD-10-CM

## 2011-07-20 NOTE — Assessment & Plan Note (Signed)
No chest pain reported 

## 2011-07-20 NOTE — Progress Notes (Signed)
Christopher Butler Date of Birth: 06-18-1924 Medical Record #[id number]  History of Present Illness: [name] is seen back today for a work in visit. He is seen for Dr. Patty Sermons. He has a history of remote CAD/MI. He has HTN. Echo back in 2010 showing an EF of 40 to 45%. He has been on ACE and beta blocker therapy.   He comes in today. He is here alone. He is complaining of a feeling of "blood rushing to his head" every time he bends over. He thinks he is lightheaded. [facility] and says it is running low. He cannot tell me how low. No chest pain. Not short of breath. No syncope. Has been taking some type of pain medicine for back pain. He is having some itching and wants to know what he can take. He says he has had his medicines cut back in the past. He takes HCTZ and his ACE on alternating days. He would like a trial off of his ACE.  Current Outpatient Prescriptions on File Prior to Visit  Medication Sig Dispense Refill  . acetaminophen (TYLENOL) 500 MG tablet Take 500 mg by mouth 2 (two) times daily.        Marland Kitchen amLODipine (NORVASC) 5 MG tablet Take 1 tablet (5 mg total) by mouth daily.  90 tablet  2  . atorvastatin (LIPITOR) 10 MG tablet Take 1 tablet (10 mg total) by mouth daily.  90 tablet  2  . hydrochlorothiazide (MICROZIDE) 12.5 MG capsule Take 1 capsule (12.5 mg total) by mouth every other day.  90 capsule  2  . lisinopril (PRINIVIL,ZESTRIL) 40 MG tablet Take 20 mg by mouth every other day. Take 1/2 every other day      . LORazepam (ATIVAN) 0.5 MG tablet Take 0.5 mg by mouth daily.        . metoprolol tartrate (LOPRESSOR) 25 MG tablet Take 1 tablet (25 mg total) by mouth daily.  90 tablet  2  . nitroGLYCERIN (NITROSTAT) 0.4 MG SL tablet Place 1 tablet (0.4 mg total) under the tongue every 5 (five) minutes as needed for chest pain.  25 tablet  prn    Allergies  Allergen Reactions  . Ceclor (Cefaclor)   . Erythromycin     Past Medical History    Diagnosis Date  . Hyponatremia   . Emphysema   . Cholelithiases   . Disc disease, degenerative, thoracic or thoracolumbar   . CAD (coronary artery disease)   . COPD (chronic obstructive pulmonary disease)   . ED (erectile dysfunction)   . ED (erectile dysfunction)   . Acute myocardial infarction   . A-fib   . Hypercholesteremia   . HTN (hypertension)   . Sleep apnea     Past Surgical History  Procedure Date  . Cardiac catheterization   . Appendectomy   . Tonsillectomy     History  Smoking status  . Former Smoker -- 1.0 packs/day  Smokeless tobacco  . Not on file    History  Alcohol Use  . Yes    History reviewed. No pertinent family history.  Review of Systems: The review of systems is per the HPI.  All other systems were reviewed and are negative.  Physical Exam: BP 126/82  Pulse 74  Ht 5\' 4"  (1.626 m)  Wt 164 lb (74.39 kg)  BMI 28.15 kg/m2 BP is 110/60 by me. Patient is very pleasant and in no acute distress. Skin is warm  and dry. Color is normal.  HEENT is unremarkable. Normocephalic/atraumatic. PERRL. Sclera are nonicteric. Neck is supple. No masses. No JVD. Lungs are fairly clear. Cardiac exam shows a regular rate and rhythm. Abdomen is soft. Extremities are without edema. Gait and ROM are intact. No gross neurologic deficits noted.   LABORATORY DATA:   Assessment / Plan:

## 2011-07-20 NOTE — Assessment & Plan Note (Signed)
He is complaining of what sounds like orthostasis. We will give him a trial off of his ACE. I explained to him that he needs to write some blood pressure readings down for me. He will keep his appointment with Dr. Patty Sermons here in a month. Patient is agreeable to this plan and will call if any problems develop in the interim.

## 2011-07-20 NOTE — Patient Instructions (Signed)
Stop the Lisinopril  [facility] and write the readings down.  You may try some Claritin for your itching. Benedryl is ok too, just watch out for the side effects.  Keep your appointment in April with Dr. Patty Sermons.  Call the [facility] office at [phone] if you have any questions, problems or concerns.

## 2011-08-21 ENCOUNTER — Other Ambulatory Visit (INDEPENDENT_AMBULATORY_CARE_PROVIDER_SITE_OTHER): Payer: Medicare Other

## 2011-08-21 DIAGNOSIS — E7800 Pure hypercholesterolemia, unspecified: Secondary | ICD-10-CM

## 2011-08-21 DIAGNOSIS — I119 Hypertensive heart disease without heart failure: Secondary | ICD-10-CM

## 2011-08-21 DIAGNOSIS — I2510 Atherosclerotic heart disease of native coronary artery without angina pectoris: Secondary | ICD-10-CM

## 2011-08-21 LAB — BASIC METABOLIC PANEL
CO2: 27 mEq/L (ref 19–32)
Calcium: 9.4 mg/dL (ref 8.4–10.5)
Chloride: 103 mEq/L (ref 96–112)
Creatinine, Ser: 1.2 mg/dL (ref 0.4–1.5)
Glucose, Bld: 97 mg/dL (ref 70–99)

## 2011-08-21 LAB — HEPATIC FUNCTION PANEL
AST: 21 U/L (ref 0–37)
Alkaline Phosphatase: 98 U/L (ref 39–117)
Total Bilirubin: 1 mg/dL (ref 0.3–1.2)

## 2011-08-21 LAB — LIPID PANEL
LDL Cholesterol: 69 mg/dL (ref 0–99)
Total CHOL/HDL Ratio: 3

## 2011-08-21 NOTE — Progress Notes (Signed)
Quick Note:  Please make copy of labs for patient visit. ______ 

## 2011-08-24 ENCOUNTER — Ambulatory Visit (INDEPENDENT_AMBULATORY_CARE_PROVIDER_SITE_OTHER): Payer: Medicare Other | Admitting: Cardiology

## 2011-08-24 ENCOUNTER — Encounter: Payer: Self-pay | Admitting: Cardiology

## 2011-08-24 VITALS — BP 130/86 | HR 81 | Ht 64.0 in | Wt 164.0 lb

## 2011-08-24 DIAGNOSIS — I4891 Unspecified atrial fibrillation: Secondary | ICD-10-CM

## 2011-08-24 DIAGNOSIS — E7800 Pure hypercholesterolemia, unspecified: Secondary | ICD-10-CM

## 2011-08-24 DIAGNOSIS — Z720 Tobacco use: Secondary | ICD-10-CM

## 2011-08-24 DIAGNOSIS — I259 Chronic ischemic heart disease, unspecified: Secondary | ICD-10-CM

## 2011-08-24 DIAGNOSIS — I482 Chronic atrial fibrillation, unspecified: Secondary | ICD-10-CM | POA: Insufficient documentation

## 2011-08-24 DIAGNOSIS — F17200 Nicotine dependence, unspecified, uncomplicated: Secondary | ICD-10-CM

## 2011-08-24 MED ORDER — WARFARIN SODIUM 5 MG PO TABS: ORAL_TABLET | ORAL | Status: DC

## 2011-08-24 NOTE — Progress Notes (Signed)
Christopher Butler Date of Birth:  05/16/1924 [facility] [address] [place], [place]  [address] [phone]         Fax   [phone]  History of Present Illness: This pleasant 78 year old gentleman is seen for a scheduled followup office visit.  He has a history of a remote inferior wall myocardial infarction.  He's had a past history of high blood pressure.  An echocardiogram in 2010 showed an ejection fraction of 40-45% seen in our clinic on 07/19/09 complaining of lightheadedness and a feeling that blood was rushing to his head when he would bend over.  He thought that it might be secondary to his ACE inhibitor and his ACE was stopped at that point.  His symptoms however have not changed when he was seen in our office on 07/20/11 he had a regular heart rhythm.  He himself has not been aware of any irregular pulse.  Current Outpatient Prescriptions  Medication Sig Dispense Refill  . acetaminophen (TYLENOL) 500 MG tablet Take 500 mg by mouth 2 (two) times daily.        Marland Kitchen amLODipine (NORVASC) 5 MG tablet Take 1 tablet (5 mg total) by mouth daily.  90 tablet  2  . aspirin 81 MG tablet Take 81 mg by mouth daily.      Marland Kitchen atorvastatin (LIPITOR) 10 MG tablet Take 1 tablet (10 mg total) by mouth daily.  90 tablet  2  . hydrochlorothiazide (MICROZIDE) 12.5 MG capsule Take 1 capsule (12.5 mg total) by mouth every other day.  90 capsule  2  . LORazepam (ATIVAN) 0.5 MG tablet Take 0.5 mg by mouth daily.        . metoprolol tartrate (LOPRESSOR) 25 MG tablet Take 1 tablet (25 mg total) by mouth daily.  90 tablet  2  . nitroGLYCERIN (NITROSTAT) 0.4 MG SL tablet Place 1 tablet (0.4 mg total) under the tongue every 5 (five) minutes as needed for chest pain.  25 tablet  prn  . warfarin (COUMADIN) 5 MG tablet Daily or as directed  30 tablet  11    Allergies  Allergen Reactions  . Ceclor (Cefaclor)   . Erythromycin     Patient Active Problem List  Diagnoses  . Ischemic heart  disease  . Tobacco abuse  . Benign hypertensive heart disease without heart failure  . Hypercholesterolemia  . Erectile dysfunction  . Low back pain  . Cholelithiasis    History  Smoking status  . Former Smoker -- 1.0 packs/day  Smokeless tobacco  . Not on file    History  Alcohol Use  . Yes    No family history on file.  Review of Systems: Constitutional: no fever chills diaphoresis or fatigue or change in weight.  Head and neck: no hearing loss, no epistaxis, no photophobia or visual disturbance. Respiratory: No cough, shortness of breath or wheezing. Cardiovascular: No chest pain peripheral edema, palpitations. Gastrointestinal: No abdominal distention, no abdominal pain, no change in bowel habits hematochezia or melena. Genitourinary: No dysuria, no frequency, no urgency, no nocturia. Musculoskeletal:No arthralgias, no back pain, no gait disturbance or myalgias. Neurological: No dizziness, no headaches, no numbness, no seizures, no syncope, no weakness, no tremors. Hematologic: No lymphadenopathy, no easy bruising. Psychiatric: No confusion, no hallucinations, no sleep disturbance.    Physical Exam: Filed Vitals:   08/24/11 1419  BP: 130/86  Pulse: 81   General appearance reveals a well-developed elderly gentleman in no distress.The head and neck exam reveals  pupils equal and reactive.  Extraocular movements are full.  There is no scleral icterus.  The mouth and pharynx are normal.  The neck is supple.  The carotids reveal no bruits.  The jugular venous pressure is normal.  The  thyroid is not enlarged.  There is no lymphadenopathy.  The chest is clear to percussion and auscultation.  There are no rales or rhonchi.  Expansion of the chest is symmetrical.  The precordium is quiet.  The heart rate is irregular  The first heart sound is normal.  The second heart sound is physiologically split.  There is no murmur gallop rub or click.  There is no abnormal lift or heave.   The abdomen is soft and nontender.  The bowel sounds are normal.  The liver and spleen are not enlarged.  There are no abdominal masses.  There are no abdominal bruits.  Extremities reveal good pedal pulses.  There is no phlebitis or edema.  There is no cyanosis or clubbing.  Strength is normal and symmetrical in all extremities.  There is no lateralizing weakness.  There are no sensory deficits.  The skin is warm and dry.  There is no rash.  EKG today shows atrial fibrillation with a controlled ventricular response.  This is a new arrhythmia since previous EKG.  Assessment / Plan:  We talked about atrial fibrillation and the increased risk of stroke.  He is a CHADSS 2(age and hypertension) or 3(compensated mild diastolic heart failure).  We will start him on Coumadin 5 mg daily for 3 days and then 2.5 mg daily thereafter until directed by Coumadin clinic next week.  We will also arrange for a two-dimensional echocardiogram.  We will check him back here in 2 months for followup office visit.  He was also advised to cut back on his nightly vodka drinks.

## 2011-08-24 NOTE — Patient Instructions (Addendum)
Start coumadin 5 mg daily for 3 days and then 1/2 tablet daily until seen next week  Your physician has requested that you have an echocardiogram. Echocardiography is a painless test that uses sound waves to create images of your heart. It provides your doctor with information about the size and shape of your heart and how well your heart's chambers and valves are working. This procedure takes approximately one hour. There are no restrictions for this procedure.   Atrial Fibrillation Your caregiver has diagnosed you with atrial fibrillation (AFib). The heart normally beats very regularly; AFib is a type of irregular heartbeat. The heart rate may be faster or slower than normal. This can prevent your heart from pumping as well as it should. AFib can be constant (chronic) or intermittent (paroxysmal). CAUSES  Atrial fibrillation may be caused by:  Heart disease, including heart attack, coronary artery disease, heart failure, diseases of the heart valves, and others.   Blood clot in the lungs (pulmonary embolism).   Pneumonia or other infections.   Chronic lung disease.   Thyroid disease.   Toxins. These include alcohol, some medications (such as decongestant medications or diet pills), and caffeine.  In some people, no cause for AFib can be found. This is referred to as Lone Atrial Fibrillation. SYMPTOMS   Palpitations or a fluttering in your chest.   A vague sense of chest discomfort.   Shortness of breath.   Sudden onset of lightheadedness or weakness.  Sometimes, the first sign of AFib can be a complication of the condition. This could be a stroke or heart failure. DIAGNOSIS  Your description of your condition may make your caregiver suspicious of atrial fibrillation. Your caregiver will examine your pulse to determine if fibrillation is present. An EKG (electrocardiogram) will confirm the diagnosis. Further testing may help determine what caused you to have atrial fibrillation.  This may include chest x-ray, echocardiogram, blood tests, or CT scans. PREVENTION  If you have previously had atrial fibrillation, your caregiver may advise you to avoid substances known to cause the condition (such as stimulant medications, and possibly caffeine or alcohol). You may be advised to use medications to prevent recurrence. Proper treatment of any underlying condition is important to help prevent recurrence. PROGNOSIS  Atrial fibrillation does tend to become a chronic condition over time. It can cause significant complications (see below). Atrial fibrillation is not usually immediately life-threatening, but it can shorten your life expectancy. This seems to be worse in women. If you have lone atrial fibrillation and are under 30 years old, the risk of complications is very low, and life expectancy is not shortened. RISKS AND COMPLICATIONS  Complications of atrial fibrillation can include stroke, chest pain, and heart failure. Your caregiver will recommend treatments for the atrial fibrillation, as well as for any underlying conditions, to help minimize risk of complications. TREATMENT  Treatment for AFib is divided into several categories:  Treatment of any underlying condition.   Converting you out of AFib into a regular (sinus) rhythm.   Controlling rapid heart rate.   Prevention of blood clots and stroke.  Medications and procedures are available to convert your atrial fibrillation to sinus rhythm. However, recent studies have shown that this may not offer you any advantage, and cardiac experts are continuing research and debate on this topic. More important is controlling your rapid heartbeat. The rapid heartbeat causes more symptoms, and places strain on your heart. Your caregiver will advise you on the use of medications that can  control your heart rate. Atrial fibrillation is a strong stroke risk. You can lessen this risk by taking blood thinning medications such as Coumadin  (warfarin), or sometimes aspirin. These medications need close monitoring by your caregiver. Over-medication can cause bleeding. Too little medication may not protect against stroke. HOME CARE INSTRUCTIONS   If your caregiver prescribed medicine to make your heartbeat more normally, take as directed.   If blood thinners were prescribed by your caregiver, take EXACTLY as directed.   Perform blood tests EXACTLY as directed.   Quit smoking. Smoking increases your cardiac and lung (pulmonary) risks.   DO NOT drink alcohol.   DO NOT drink caffeinated drinks (e.g. coffee, soda, chocolate, and leaf teas). You may drink decaffeinated coffee, soda or tea.   If you are overweight, you should choose a reduced calorie diet to lose weight. Please see a registered dietitian if you need more information about healthy weight loss. DO NOT USE DIET PILLS as they may aggravate heart problems.   If you have other heart problems that are causing AFib, you may need to eat a low salt, fat, and cholesterol diet. Your caregiver will tell you if this is necessary.   Exercise every day to improve your physical fitness. Stay active unless advised otherwise.   If your caregiver has given you a follow-up appointment, it is very important to keep that appointment. Not keeping the appointment could result in heart failure or stroke. If there is any problem keeping the appointment, you must call back to this facility for assistance.  SEEK MEDICAL CARE IF:  You notice a change in the rate, rhythm or strength of your heartbeat.   You develop an infection or any other change in your overall health status.  SEEK IMMEDIATE MEDICAL CARE IF:   You develop chest pain, abdominal pain, sweating, weakness or feel sick to your stomach (nausea).   You develop shortness of breath.   You develop swollen feet and ankles.   You develop dizziness, numbness, or weakness of your face or limbs, or any change in vision or speech.    MAKE SURE YOU:   Understand these instructions.   Will watch your condition.   Will get help right away if you are not doing well or get worse.  Document Released: 04/17/2005 Document Revised: 04/06/2011 Document Reviewed: 11/20/2007 [facility] Patient Information 2012 [place], [place].  Come next week for coumadin clinic  Follow up in 2 months with  Dr. Patty Sermons

## 2011-08-24 NOTE — Assessment & Plan Note (Signed)
The patient has a history of hypercholesterolemia and is on Lipitor 10 mg daily.  He has not been experiencing any myalgias from Lipitor.

## 2011-08-24 NOTE — Assessment & Plan Note (Signed)
The patient has not been experiencing any chest pain or angina. 

## 2011-08-24 NOTE — Assessment & Plan Note (Signed)
Routine EKG today showed that he was in atrial fibrillation with a controlled ventricular response.  He has noted recently that his pulse frequently would be higher than you would expect.  He has not had any TIA symptoms.  He has been taking a baby aspirin.

## 2011-08-24 NOTE — Assessment & Plan Note (Signed)
The patient has not smoked cigarettes since May of last year, almost a year.  He does have 2 vodka drinks a night.

## 2011-08-25 ENCOUNTER — Other Ambulatory Visit: Payer: Self-pay

## 2011-08-25 ENCOUNTER — Ambulatory Visit (HOSPITAL_COMMUNITY): Payer: Medicare Other | Attending: Internal Medicine

## 2011-08-25 DIAGNOSIS — E7800 Pure hypercholesterolemia, unspecified: Secondary | ICD-10-CM | POA: Insufficient documentation

## 2011-08-25 DIAGNOSIS — I2589 Other forms of chronic ischemic heart disease: Secondary | ICD-10-CM | POA: Insufficient documentation

## 2011-08-25 DIAGNOSIS — G4730 Sleep apnea, unspecified: Secondary | ICD-10-CM | POA: Insufficient documentation

## 2011-08-25 DIAGNOSIS — I2510 Atherosclerotic heart disease of native coronary artery without angina pectoris: Secondary | ICD-10-CM | POA: Insufficient documentation

## 2011-08-25 DIAGNOSIS — I079 Rheumatic tricuspid valve disease, unspecified: Secondary | ICD-10-CM | POA: Insufficient documentation

## 2011-08-25 DIAGNOSIS — I10 Essential (primary) hypertension: Secondary | ICD-10-CM | POA: Insufficient documentation

## 2011-08-25 DIAGNOSIS — I4891 Unspecified atrial fibrillation: Secondary | ICD-10-CM | POA: Insufficient documentation

## 2011-08-25 DIAGNOSIS — Z87891 Personal history of nicotine dependence: Secondary | ICD-10-CM | POA: Insufficient documentation

## 2011-08-25 DIAGNOSIS — I517 Cardiomegaly: Secondary | ICD-10-CM | POA: Insufficient documentation

## 2011-08-25 DIAGNOSIS — I252 Old myocardial infarction: Secondary | ICD-10-CM | POA: Insufficient documentation

## 2011-08-29 ENCOUNTER — Ambulatory Visit (INDEPENDENT_AMBULATORY_CARE_PROVIDER_SITE_OTHER): Payer: Medicare Other | Admitting: Pharmacist

## 2011-08-29 DIAGNOSIS — I4891 Unspecified atrial fibrillation: Secondary | ICD-10-CM

## 2011-08-29 DIAGNOSIS — Z7901 Long term (current) use of anticoagulants: Secondary | ICD-10-CM

## 2011-08-29 NOTE — Patient Instructions (Signed)
A full discussion of the nature of anticoagulants has been carried out.  A benefit risk analysis has been presented to the patient, so that they understand the justification for choosing anticoagulation at this time. The need for frequent and regular monitoring, precise dosage adjustment and compliance is stressed.  Side effects of potential bleeding are discussed.  The patient should avoid any OTC items containing aspirin or ibuprofen, and should avoid great swings in general diet.  Avoid alcohol consumption.  Call if any signs of abnormal bleeding.  Next PT/INR test in 1 week 

## 2011-08-30 ENCOUNTER — Other Ambulatory Visit: Payer: Self-pay | Admitting: Cardiology

## 2011-08-30 MED ORDER — LISINOPRIL 10 MG PO TABS: 10.0000 mg | ORAL_TABLET | Freq: Every day | ORAL | Status: DC

## 2011-08-30 NOTE — Telephone Encounter (Signed)
Message copied by Burnell Blanks on Wed Aug 30, 2011  4:27 PM ------      Message from: Cassell Clement      Created: Sun Aug 27, 2011  7:57 AM       Please report.  The echo shows decreased LV systolic function. To help this add lisinopril 10 mg daily.  Check BMET in about a week after starting

## 2011-08-30 NOTE — Telephone Encounter (Signed)
Spoke with patient and he was recently advised by Lawson Fiscal NP to stop his Lisinopril secondary to what sounded like hypotension.  Patient currently on other HTN medications.  Will forward to  Dr. Patty Sermons for review

## 2011-08-30 NOTE — Telephone Encounter (Signed)
With his decreased LV function and now atrial fib he would be helped by going back on small dose (10 mg) lisinopril and stopping the amlodipine (to avoid BP dropping too low). He should try it and let us know if the side effects recur.-

## 2011-08-30 NOTE — Telephone Encounter (Signed)
Advised patient.  Left message needed to schedule labs.  Will follow up Friday to make sure message recieved

## 2011-08-30 NOTE — Telephone Encounter (Signed)
Message copied by Burnell Blanks on Wed Aug 30, 2011  4:14 PM ------      Message from: Cassell Clement      Created: Sun Aug 27, 2011  7:57 AM       Please report.  The echo shows decreased LV systolic function. To help this add lisinopril 10 mg daily.  Check BMET in about a week after starting

## 2011-08-30 NOTE — Telephone Encounter (Signed)
Patient returning nurse MP call, he can be reached at [phone].

## 2011-09-01 ENCOUNTER — Telehealth: Payer: Self-pay | Admitting: Cardiology

## 2011-09-01 NOTE — Telephone Encounter (Signed)
Fu call °Patient returning your call °

## 2011-09-01 NOTE — Telephone Encounter (Signed)
Pt calling re more info on coumadin or blood test

## 2011-09-01 NOTE — Telephone Encounter (Signed)
Patient called back and will try to arrange labs and protime together

## 2011-09-01 NOTE — Telephone Encounter (Signed)
Rescheduled coumadin check same day as labs, advised patient

## 2011-09-07 ENCOUNTER — Ambulatory Visit (INDEPENDENT_AMBULATORY_CARE_PROVIDER_SITE_OTHER): Payer: Medicare Other | Admitting: *Deleted

## 2011-09-07 DIAGNOSIS — Z7901 Long term (current) use of anticoagulants: Secondary | ICD-10-CM

## 2011-09-07 DIAGNOSIS — I4891 Unspecified atrial fibrillation: Secondary | ICD-10-CM

## 2011-09-07 DIAGNOSIS — I119 Hypertensive heart disease without heart failure: Secondary | ICD-10-CM

## 2011-09-07 LAB — BASIC METABOLIC PANEL
CO2: 27 mEq/L (ref 19–32)
Calcium: 9.3 mg/dL (ref 8.4–10.5)
Creatinine, Ser: 1.4 mg/dL (ref 0.4–1.5)

## 2011-09-14 ENCOUNTER — Telehealth: Payer: Self-pay

## 2011-09-14 ENCOUNTER — Ambulatory Visit (INDEPENDENT_AMBULATORY_CARE_PROVIDER_SITE_OTHER): Payer: Medicare Other | Admitting: *Deleted

## 2011-09-14 DIAGNOSIS — I4891 Unspecified atrial fibrillation: Secondary | ICD-10-CM

## 2011-09-14 DIAGNOSIS — Z7901 Long term (current) use of anticoagulants: Secondary | ICD-10-CM

## 2011-09-14 LAB — POCT INR: INR: 2.7

## 2011-09-14 NOTE — Telephone Encounter (Signed)
Patient aware of labs.  

## 2011-09-21 ENCOUNTER — Ambulatory Visit (INDEPENDENT_AMBULATORY_CARE_PROVIDER_SITE_OTHER): Payer: Medicare Other | Admitting: *Deleted

## 2011-09-21 DIAGNOSIS — I4891 Unspecified atrial fibrillation: Secondary | ICD-10-CM

## 2011-09-21 DIAGNOSIS — Z7901 Long term (current) use of anticoagulants: Secondary | ICD-10-CM

## 2011-09-21 LAB — POCT INR: INR: 2.7

## 2011-09-28 ENCOUNTER — Telehealth: Payer: Self-pay | Admitting: Cardiology

## 2011-09-28 ENCOUNTER — Telehealth: Payer: Self-pay | Admitting: *Deleted

## 2011-09-28 NOTE — Telephone Encounter (Signed)
New msg Pt wants to talk to you about coumadin. He wants to get an injections in his back. Please call

## 2011-09-28 NOTE — Telephone Encounter (Signed)
Would refer to coumadin clinic for lovenox bridging.

## 2011-09-28 NOTE — Telephone Encounter (Signed)
Wants to get injection in back by Dr Noel Gerold but needs to hold his coumadin.  Will forward to  Dr. Patty Sermons for review

## 2011-09-28 NOTE — Telephone Encounter (Signed)
Left message to call back  

## 2011-09-28 NOTE — Telephone Encounter (Signed)
Will forward to coumadin clinic.  Advised patient coumadin clinic will call and discuss.  This is a [employer].  Patient is hoping to get this injection around June 11

## 2011-09-29 NOTE — Telephone Encounter (Signed)
Discussed lovenox bridging with patient, he will be checking on his co-pay amounts and if workers comp will assist with lovenox cost. He is having an injection in his back on ? 10/10/2011 Wt-74.39 [place]-1.4 CC-40.58 He will need enoxoparin 80mg  bid

## 2011-10-02 ENCOUNTER — Telehealth: Payer: Self-pay | Admitting: Cardiology

## 2011-10-02 NOTE — Telephone Encounter (Signed)
New problem:  Patient having an epidural injection schedule.  please advise on coumadin.

## 2011-10-02 NOTE — Telephone Encounter (Signed)
Okay to hold warfarin and we should have him bridge with lovenox.  Refer to [employer].

## 2011-10-02 NOTE — Telephone Encounter (Signed)
PROCEDURE NOT SCHEDULED AS OF YET  POSSIBLY  MAY HAVE DONE  NEXT MON OR TUES  PENDING IF  PT MAY HOLD COULMADIN  WILL FORWARD TO DR BRACKBILL FOR REVIEW./CY

## 2011-10-03 NOTE — Telephone Encounter (Signed)
Forwarded to coumadin clinic

## 2011-10-03 NOTE — Telephone Encounter (Signed)
Attempted to call Joy at MD office.  LM for her to call us back regarding surgery date.

## 2011-10-04 ENCOUNTER — Other Ambulatory Visit: Payer: Self-pay | Admitting: *Deleted

## 2011-10-04 DIAGNOSIS — F419 Anxiety disorder, unspecified: Secondary | ICD-10-CM

## 2011-10-04 MED ORDER — LORAZEPAM 0.5 MG PO TABS: 0.5000 mg | ORAL_TABLET | Freq: Every day | ORAL | Status: DC

## 2011-10-04 NOTE — Telephone Encounter (Signed)
Spoke with Joy.  They have not set up a date yet.  She will call when the procedure is scheduled.

## 2011-10-05 ENCOUNTER — Ambulatory Visit (INDEPENDENT_AMBULATORY_CARE_PROVIDER_SITE_OTHER): Payer: Medicare Other | Admitting: *Deleted

## 2011-10-05 DIAGNOSIS — Z7901 Long term (current) use of anticoagulants: Secondary | ICD-10-CM

## 2011-10-05 DIAGNOSIS — I4891 Unspecified atrial fibrillation: Secondary | ICD-10-CM

## 2011-10-05 NOTE — Telephone Encounter (Signed)
Pt came in today for Coumadin clinic visit.  His procedure date was set for 6/11. After discussing Lovenox bridging, pt will be out of town starting 6/16 and did not want to be on Lovenox at that time.  He would prefer to reschedule his injection.  I have spoken with Joy at MD's office.  She is aware pt would like to change the date.  Pt has been instructed to call Joy when he is ready to reschedule and they will relay information to [place] for Lovenox bridging.

## 2011-10-24 ENCOUNTER — Ambulatory Visit (INDEPENDENT_AMBULATORY_CARE_PROVIDER_SITE_OTHER): Payer: Medicare Other | Admitting: *Deleted

## 2011-10-24 ENCOUNTER — Ambulatory Visit (INDEPENDENT_AMBULATORY_CARE_PROVIDER_SITE_OTHER): Payer: Medicare Other | Admitting: Cardiology

## 2011-10-24 ENCOUNTER — Encounter: Payer: Self-pay | Admitting: Cardiology

## 2011-10-24 ENCOUNTER — Ambulatory Visit (INDEPENDENT_AMBULATORY_CARE_PROVIDER_SITE_OTHER)
Admission: RE | Admit: 2011-10-24 | Discharge: 2011-10-24 | Disposition: A | Discharge status: Home / Self Care | Payer: Medicare Other | Source: Ambulatory Visit | Attending: Cardiology | Admitting: Cardiology

## 2011-10-24 VITALS — BP 130/84 | HR 100 | Ht 64.0 in | Wt 165.0 lb

## 2011-10-24 DIAGNOSIS — N529 Male erectile dysfunction, unspecified: Secondary | ICD-10-CM

## 2011-10-24 DIAGNOSIS — Z7901 Long term (current) use of anticoagulants: Secondary | ICD-10-CM

## 2011-10-24 DIAGNOSIS — I4891 Unspecified atrial fibrillation: Secondary | ICD-10-CM

## 2011-10-24 DIAGNOSIS — I259 Chronic ischemic heart disease, unspecified: Secondary | ICD-10-CM

## 2011-10-24 DIAGNOSIS — F17200 Nicotine dependence, unspecified, uncomplicated: Secondary | ICD-10-CM

## 2011-10-24 DIAGNOSIS — R071 Chest pain on breathing: Secondary | ICD-10-CM

## 2011-10-24 DIAGNOSIS — I4821 Permanent atrial fibrillation: Secondary | ICD-10-CM | POA: Insufficient documentation

## 2011-10-24 DIAGNOSIS — Z720 Tobacco use: Secondary | ICD-10-CM

## 2011-10-24 DIAGNOSIS — I2510 Atherosclerotic heart disease of native coronary artery without angina pectoris: Secondary | ICD-10-CM

## 2011-10-24 MED ORDER — METOPROLOL TARTRATE 25 MG PO TABS: 25.0000 mg | ORAL_TABLET | Freq: Two times a day (BID) | ORAL | Status: DC

## 2011-10-24 NOTE — Assessment & Plan Note (Signed)
The patient continues to smoke against advice.  His last chest x-ray was more than 2 years ago and we are getting a chest x-ray on him today.  He was counseled against smoking

## 2011-10-24 NOTE — Progress Notes (Signed)
Christopher Butler Date of Birth:  09-02-1924 [facility] [address] [place], [place]  [address] [phone]         Fax   [phone]  History of Present Illness: This pleasant 78 year old gentleman is seen for a scheduled followup office visit.  He has a history of remote inferior wall myocardial infarction.  He has a history of atrial fibrillation and is on Coumadin.  He had an echocardiogram in 2010 showing an ejection fraction of 40-45%.  He was last seen in our office on 08/24/11 at which time he was found to be in atrial fibrillation.  He is not aware of his arrhythmia.  He has been on Coumadin since April 2013.  Since we last saw him he was on vacation at the beach last week.  On that day he was coming home he tripped on the steps fell and struck the left posterior portion of his chest wall against a post.  He has a lot of swelling pain and pleuritic discomfort at this area.  There is some moderate ecchymosis.  Current Outpatient Prescriptions  Medication Sig Dispense Refill  . acetaminophen (TYLENOL) 500 MG tablet Take 500 mg by mouth 2 (two) times daily.        Marland Kitchen aspirin 81 MG tablet Take 81 mg by mouth daily.      Marland Kitchen atorvastatin (LIPITOR) 10 MG tablet Take 1 tablet (10 mg total) by mouth daily.  90 tablet  2  . hydrochlorothiazide (MICROZIDE) 12.5 MG capsule Take 1 capsule (12.5 mg total) by mouth every other day.  90 capsule  2  . lisinopril (PRINIVIL) 10 MG tablet Take 1 tablet (10 mg total) by mouth daily.  30 tablet  11  . LORazepam (ATIVAN) 0.5 MG tablet Take 1 tablet (0.5 mg total) by mouth daily.  30 tablet  5  . metoprolol tartrate (LOPRESSOR) 25 MG tablet Take 1 tablet (25 mg total) by mouth 2 (two) times daily.  90 tablet  2  . nitroGLYCERIN (NITROSTAT) 0.4 MG SL tablet Place 1 tablet (0.4 mg total) under the tongue every 5 (five) minutes as needed for chest pain.  25 tablet  prn  . warfarin (COUMADIN) 5 MG tablet Daily or as directed  30 tablet  11   . DISCONTD: metoprolol tartrate (LOPRESSOR) 25 MG tablet Take 1 tablet (25 mg total) by mouth daily.  90 tablet  2    Allergies  Allergen Reactions  . Ceclor (Cefaclor)   . Erythromycin     Patient Active Problem List  Diagnosis  . Ischemic heart disease  . Tobacco abuse  . Benign hypertensive heart disease without heart failure  . Hypercholesterolemia  . Erectile dysfunction  . Low back pain  . Cholelithiasis  . Atrial fibrillation    History  Smoking status  . Former Smoker -- 1.0 packs/day  Smokeless tobacco  . Not on file    History  Alcohol Use  . Yes    No family history on file.  Review of Systems: Constitutional: no fever chills diaphoresis or fatigue or change in weight.  Head and neck: no hearing loss, no epistaxis, no photophobia or visual disturbance. Respiratory: No cough, shortness of breath or wheezing. Cardiovascular: No chest pain peripheral edema, palpitations. Gastrointestinal: No abdominal distention, no abdominal pain, no change in bowel habits hematochezia or melena. Genitourinary: No dysuria, no frequency, no urgency, no nocturia. Musculoskeletal:No arthralgias, no back pain, no gait disturbance or myalgias. Neurological: No dizziness, no headaches,  no numbness, no seizures, no syncope, no weakness, no tremors. Hematologic: No lymphadenopathy, no easy bruising. Psychiatric: No confusion, no hallucinations, no sleep disturbance.    Physical Exam: Filed Vitals:   10/24/11 1114  BP: 130/84  Pulse: 100   general appearance reveals a well-developed elderly gentleman in no acute distress.  He is tender along the left posterior lateral chest wall from his recent fall.Pupils equal and reactive.   Extraocular Movements are full.  There is no scleral icterus.  The mouth and pharynx are normal.  The neck is supple.  The carotids reveal no bruits.  The jugular venous pressure is normal.  The thyroid is not enlarged.  There is no  lymphadenopathy.  The chest reveals no pleural rub and he has good breath sounds bilaterally.  Heart reveals an irregular pulse.  No murmur gallop or rub.The abdomen is soft and nontender. Bowel sounds are normal. The liver and spleen are not enlarged. There Are no abdominal masses. There are no bruits.  Extremities show 1+ ankle edema.  No phlebitis.  Pedal pulses are weak but present.   Assessment / Plan:  The patient is to increase his metoprolol to twice a day for better rate control of his atrial fib.  We will plan to see him in 2 months for followup office visit EKG and fasting lab work.  He is also concerned about his ED and we will get a testosterone level at his request.  We are checking a chest x-ray today.

## 2011-10-24 NOTE — Assessment & Plan Note (Signed)
The patient has not been having any recurrent chest pain or angina. 

## 2011-10-24 NOTE — Patient Instructions (Addendum)
Your physician recommends that you schedule a follow-up appointment in: 2 months with ekg and fasting labs  Your physician recommends that you return for lab work in: 2 months along with next appt (bmet, tsh, liver, lipid)  A chest x-ray takes a picture of the organs and structures inside the chest, including the heart, lungs, and blood vessels. This test can show several things, including, whether the heart is enlarges; whether fluid is building up in the lungs; and whether pacemaker / defibrillator leads are still in place.  Please go to the [facility] office on [address] today to get this done  Your physician has recommended you make the following change in your medication: Increase your Metoprolol to 25mg  twice daily

## 2011-10-24 NOTE — Assessment & Plan Note (Signed)
The patient has noted an increased heart rate since he has been in atrial fibrillation.  Today his pulse is about 100 at rest.  We will increase his metoprolol tartrate to 25 mg twice a day for better rate control

## 2011-11-03 ENCOUNTER — Telehealth: Payer: Self-pay | Admitting: Cardiology

## 2011-11-03 NOTE — Telephone Encounter (Signed)
New msg Pt wants to talk to you about xray he had taken last week please call

## 2011-11-03 NOTE — Telephone Encounter (Signed)
Pt wanted to know how to get copy of xray to take to another dr/ [facility] number given.

## 2011-11-21 ENCOUNTER — Ambulatory Visit (INDEPENDENT_AMBULATORY_CARE_PROVIDER_SITE_OTHER): Payer: Medicare Other | Admitting: *Deleted

## 2011-11-21 DIAGNOSIS — Z7901 Long term (current) use of anticoagulants: Secondary | ICD-10-CM

## 2011-11-21 DIAGNOSIS — I4891 Unspecified atrial fibrillation: Secondary | ICD-10-CM

## 2011-11-21 LAB — POCT INR: INR: 3.8

## 2011-12-05 ENCOUNTER — Ambulatory Visit (INDEPENDENT_AMBULATORY_CARE_PROVIDER_SITE_OTHER): Payer: Medicare Other | Admitting: *Deleted

## 2011-12-05 DIAGNOSIS — I4891 Unspecified atrial fibrillation: Secondary | ICD-10-CM

## 2011-12-05 DIAGNOSIS — Z7901 Long term (current) use of anticoagulants: Secondary | ICD-10-CM

## 2011-12-21 ENCOUNTER — Other Ambulatory Visit (INDEPENDENT_AMBULATORY_CARE_PROVIDER_SITE_OTHER): Payer: Medicare Other

## 2011-12-21 DIAGNOSIS — I2510 Atherosclerotic heart disease of native coronary artery without angina pectoris: Secondary | ICD-10-CM

## 2011-12-21 DIAGNOSIS — R071 Chest pain on breathing: Secondary | ICD-10-CM

## 2011-12-21 DIAGNOSIS — I4891 Unspecified atrial fibrillation: Secondary | ICD-10-CM

## 2011-12-21 DIAGNOSIS — N529 Male erectile dysfunction, unspecified: Secondary | ICD-10-CM

## 2011-12-21 LAB — BASIC METABOLIC PANEL
Calcium: 9 mg/dL (ref 8.4–10.5)
GFR: 62.9 mL/min (ref >60.00)
Glucose, Bld: 99 mg/dL (ref 70–99)
Potassium: 4 mEq/L (ref 3.5–5.1)
Sodium: 131 mEq/L — ABNORMAL LOW (ref 135–145)

## 2011-12-21 LAB — LIPID PANEL
HDL: 41 mg/dL (ref >39.00)
Total CHOL/HDL Ratio: 3
Triglycerides: 126 mg/dL (ref 0.0–149.0)

## 2011-12-21 LAB — TSH: TSH: 1.99 u[IU]/mL (ref 0.35–5.50)

## 2011-12-21 LAB — HEPATIC FUNCTION PANEL
ALT: 21 U/L (ref 0–53)
AST: 20 U/L (ref 0–37)
Albumin: 3.8 g/dL (ref 3.5–5.2)
Alkaline Phosphatase: 111 U/L (ref 39–117)
Total Bilirubin: 1.2 mg/dL (ref 0.3–1.2)

## 2011-12-21 NOTE — Progress Notes (Signed)
Quick Note:  Please make copy of labs for patient visit. ______ 

## 2011-12-27 ENCOUNTER — Ambulatory Visit (INDEPENDENT_AMBULATORY_CARE_PROVIDER_SITE_OTHER): Payer: Medicare Other | Admitting: Cardiology

## 2011-12-27 ENCOUNTER — Encounter: Payer: Self-pay | Admitting: Cardiology

## 2011-12-27 VITALS — BP 130/82 | HR 85 | Ht 64.0 in | Wt 161.0 lb

## 2011-12-27 DIAGNOSIS — R5383 Other fatigue: Secondary | ICD-10-CM

## 2011-12-27 DIAGNOSIS — I259 Chronic ischemic heart disease, unspecified: Secondary | ICD-10-CM

## 2011-12-27 DIAGNOSIS — R5381 Other malaise: Secondary | ICD-10-CM

## 2011-12-27 DIAGNOSIS — I2510 Atherosclerotic heart disease of native coronary artery without angina pectoris: Secondary | ICD-10-CM

## 2011-12-27 DIAGNOSIS — I119 Hypertensive heart disease without heart failure: Secondary | ICD-10-CM

## 2011-12-27 DIAGNOSIS — I4891 Unspecified atrial fibrillation: Secondary | ICD-10-CM

## 2011-12-27 DIAGNOSIS — E7800 Pure hypercholesterolemia, unspecified: Secondary | ICD-10-CM

## 2011-12-27 MED ORDER — HYDROCHLOROTHIAZIDE 12.5 MG PO CAPS: 12.5000 mg | ORAL_CAPSULE | ORAL | Status: DC

## 2011-12-27 MED ORDER — LISINOPRIL 10 MG PO TABS: 10.0000 mg | ORAL_TABLET | Freq: Every day | ORAL | Status: DC

## 2011-12-27 MED ORDER — WARFARIN SODIUM 5 MG PO TABS: ORAL_TABLET | ORAL | Status: DC

## 2011-12-27 MED ORDER — METOPROLOL TARTRATE 25 MG PO TABS: 25.0000 mg | ORAL_TABLET | Freq: Two times a day (BID) | ORAL | Status: DC

## 2011-12-27 NOTE — Assessment & Plan Note (Signed)
The patient has a history of atrial fibrillation.  He is being treated with Coumadin and rate control with beta blocker.  He has had no TIA symptoms.

## 2011-12-27 NOTE — Patient Instructions (Signed)
Your physician recommends that you continue on your current medications as directed. Please refer to the Current Medication list given to you today.  Your physician recommends that you schedule a follow-up appointment in: 4 months with fasting labs (lp/bmet/hfp)  

## 2011-12-27 NOTE — Progress Notes (Signed)
Christopher Butler Date of Birth:  July 14, 1924 [facility] [address] [place], [place]  [address] [phone]         Fax   [phone]  History This pleasant 78 year old gentleman is seen for a scheduled followup office visit. He has a history of remote inferior wall myocardial infarction. He has a history of atrial fibrillation and is on Coumadin. He had an echocardiogram in 2010 showing an ejection fraction of 40-45%. He was last seen in our office on 08/24/11 at which time he was found to be in atrial fibrillation. He is not aware of his arrhythmia. He has been on Coumadin since April 2013.  The patient has not been experiencing any chest pain or any increasing shortness of breath.  Unfortunately he has started smoking again.  Start about the importance of stopping smoking permanently.  The patient continues to have some problems with his back.  He sees Dr. Sharolyn Douglas.  He may need an intraspinal injection and if he does he will need Lovenox bridging.   Current Outpatient Prescriptions  Medication Sig Dispense Refill  . acetaminophen (TYLENOL) 500 MG tablet Take 500 mg by mouth as directed. Taking 2 in the am      . amLODipine (NORVASC) 5 MG tablet Take 5 mg by mouth daily.      Marland Kitchen aspirin 81 MG tablet Take 81 mg by mouth daily.      Marland Kitchen atorvastatin (LIPITOR) 10 MG tablet Take 1 tablet (10 mg total) by mouth daily.  90 tablet  2  . hydrochlorothiazide (MICROZIDE) 12.5 MG capsule Take 1 capsule (12.5 mg total) by mouth every other day.  45 capsule  3  . lisinopril (PRINIVIL) 10 MG tablet Take 1 tablet (10 mg total) by mouth daily.  90 tablet  3  . LORazepam (ATIVAN) 0.5 MG tablet Take 1 tablet (0.5 mg total) by mouth daily.  30 tablet  5  . metoprolol tartrate (LOPRESSOR) 25 MG tablet Take 1 tablet (25 mg total) by mouth 2 (two) times daily.  180 tablet  3  . nitroGLYCERIN (NITROSTAT) 0.4 MG SL tablet Place 1 tablet (0.4 mg total) under the tongue every 5 (five) minutes as  needed for chest pain.  25 tablet  prn  . warfarin (COUMADIN) 5 MG tablet Daily or as directed  90 tablet  3  . DISCONTD: hydrochlorothiazide (MICROZIDE) 12.5 MG capsule Take 1 capsule (12.5 mg total) by mouth every other day.  90 capsule  2  . DISCONTD: metoprolol tartrate (LOPRESSOR) 25 MG tablet Take 1 tablet (25 mg total) by mouth 2 (two) times daily.  90 tablet  2  . DISCONTD: warfarin (COUMADIN) 5 MG tablet Daily or as directed  30 tablet  11    Allergies  Allergen Reactions  . Ceclor (Cefaclor)   . Erythromycin     Patient Active Problem List  Diagnosis  . Ischemic heart disease  . Tobacco abuse  . Benign hypertensive heart disease without heart failure  . Hypercholesterolemia  . Erectile dysfunction  . Low back pain  . Cholelithiasis  . Atrial fibrillation    History  Smoking status  . Current Some Day Smoker -- 1.0 packs/day  Smokeless tobacco  . Not on file    History  Alcohol Use  . Yes    No family history on file.  Review of Systems: Constitutional: no fever chills diaphoresis or fatigue or change in weight.  Head and neck: no hearing loss, no epistaxis,  no photophobia or visual disturbance. Respiratory: No cough, shortness of breath or wheezing. Cardiovascular: No chest pain peripheral edema, palpitations. Gastrointestinal: No abdominal distention, no abdominal pain, no change in bowel habits hematochezia or melena. Genitourinary: No dysuria, no frequency, no urgency, no nocturia. Musculoskeletal:No arthralgias, no back pain, no gait disturbance or myalgias. Neurological: No dizziness, no headaches, no numbness, no seizures, no syncope, no weakness, no tremors. Hematologic: No lymphadenopathy, no easy bruising. Psychiatric: No confusion, no hallucinations, no sleep disturbance.    Physical Exam: Filed Vitals:   12/27/11 0944  BP: 130/82  Pulse: 85   general appearance reveals a well-developed well-nourished elderly gentleman in no acute  distress.Pupils equal and reactive.   Extraocular Movements are full.  There is no scleral icterus.  The mouth and pharynx are normal.  The neck is supple.  The carotids reveal no bruits.  The jugular venous pressure is normal.  The thyroid is not enlarged.  There is no lymphadenopathy.  The chest is clear to percussion and auscultation. There are no rales or rhonchi. Expansion of the chest is symmetrical.  Heart reveals an irregular rhythm and no murmur gallop rub.The abdomen is soft and nontender. Bowel sounds are normal. The liver and spleen are not enlarged. There Are no abdominal masses. There are no bruits.  The pedal pulses are good.  There is no phlebitis or edema.  There is no cyanosis or clubbing. Strength is normal and symmetrical in all extremities.  There is no lateralizing weakness.  There are no sensory deficits.  The skin is warm and dry.  There is no rash.  EKG today shows atrial fibrillation with occasional PVCs.  There are nonspecific T-wave changes.   Assessment / Plan: Continue same medication.  Recheck in 4 months for a followup office visit EKG lipid panel hepatic function panel basal metabolic panel and testosterone level.  The testosterone level is because the patient feels that he may have symptoms of low T. especially muscle weakness and lack of energy

## 2011-12-27 NOTE — Assessment & Plan Note (Signed)
The patient has not been experiencing any recurrent chest pain or angina.  No recent sublingual nitroglycerin have been needed.

## 2011-12-27 NOTE — Assessment & Plan Note (Signed)
The patient denies any dizziness or syncope.  He is not having any symptoms of congestive heart failure.

## 2012-01-02 ENCOUNTER — Ambulatory Visit (INDEPENDENT_AMBULATORY_CARE_PROVIDER_SITE_OTHER): Payer: Medicare Other | Admitting: Pharmacist

## 2012-01-02 DIAGNOSIS — I4891 Unspecified atrial fibrillation: Secondary | ICD-10-CM

## 2012-01-02 DIAGNOSIS — Z7901 Long term (current) use of anticoagulants: Secondary | ICD-10-CM

## 2012-01-30 ENCOUNTER — Ambulatory Visit (INDEPENDENT_AMBULATORY_CARE_PROVIDER_SITE_OTHER): Payer: Medicare Other | Admitting: *Deleted

## 2012-01-30 DIAGNOSIS — I4891 Unspecified atrial fibrillation: Secondary | ICD-10-CM

## 2012-01-30 DIAGNOSIS — Z7901 Long term (current) use of anticoagulants: Secondary | ICD-10-CM

## 2012-01-30 LAB — POCT INR: INR: 3.5

## 2012-02-01 ENCOUNTER — Telehealth: Payer: Self-pay | Admitting: *Deleted

## 2012-02-01 MED ORDER — ENOXAPARIN SODIUM 100 MG/ML ~~LOC~~ SOLN: 100.0000 mg | Freq: Every day | SUBCUTANEOUS | Status: DC

## 2012-02-01 NOTE — Telephone Encounter (Signed)
Patient called and has date for spinal injection on 02/14/2012. (note per Dr Patty Sermons) The patient continues to have some problems with his back. He sees Dr. Sharolyn Douglas. He may need an intraspinal injection and if he does he will need Lovenox bridging. 78 yo Wt 73.02 [place] 1.2 CC 46.48 02/08/2012 last dose of coumadin 02/09/2012 no coumadin or lovenox 02/10/2012 lovenox 100 mg in the morning 02/11/2012 lovenox 100 mg in the morning 02/12/2012 lovenox 100 mg in the morning 02/13/2012 lovenox 100 mg in the morning 02/14/2012 day of procedure  When MD starts you back on coumadin take an extra 1/2 tablet for 2 days and continue your lovenox 100 mg daily until you return to coumadin clinic. rov 02/19/2012

## 2012-02-14 ENCOUNTER — Ambulatory Visit (INDEPENDENT_AMBULATORY_CARE_PROVIDER_SITE_OTHER): Payer: Medicare Other | Admitting: *Deleted

## 2012-02-14 DIAGNOSIS — Z7901 Long term (current) use of anticoagulants: Secondary | ICD-10-CM

## 2012-02-14 DIAGNOSIS — I4891 Unspecified atrial fibrillation: Secondary | ICD-10-CM

## 2012-02-14 LAB — POCT INR: INR: 1.2

## 2012-02-15 ENCOUNTER — Telehealth: Payer: Self-pay | Admitting: *Deleted

## 2012-02-15 NOTE — Telephone Encounter (Signed)
Patient called, states he messed up and took his lovenox  last night, MD would not do his injection today due to that, he is going to have spinal injection tomorrow, moved appt date for f/u from 10/22 to 10/23.

## 2012-02-21 ENCOUNTER — Ambulatory Visit (INDEPENDENT_AMBULATORY_CARE_PROVIDER_SITE_OTHER): Payer: Medicare Other | Admitting: *Deleted

## 2012-02-21 DIAGNOSIS — I4891 Unspecified atrial fibrillation: Secondary | ICD-10-CM

## 2012-02-21 DIAGNOSIS — Z7901 Long term (current) use of anticoagulants: Secondary | ICD-10-CM

## 2012-02-21 LAB — POCT INR: INR: 1.7

## 2012-03-05 ENCOUNTER — Ambulatory Visit (INDEPENDENT_AMBULATORY_CARE_PROVIDER_SITE_OTHER): Payer: Medicare Other | Admitting: *Deleted

## 2012-03-05 DIAGNOSIS — I4891 Unspecified atrial fibrillation: Secondary | ICD-10-CM

## 2012-03-05 DIAGNOSIS — Z7901 Long term (current) use of anticoagulants: Secondary | ICD-10-CM

## 2012-03-05 LAB — POCT INR: INR: 3.5

## 2012-03-07 ENCOUNTER — Other Ambulatory Visit: Payer: Self-pay

## 2012-03-07 MED ORDER — AMLODIPINE BESYLATE 5 MG PO TABS: 5.0000 mg | ORAL_TABLET | Freq: Every day | ORAL | Status: DC

## 2012-03-07 MED ORDER — ATORVASTATIN CALCIUM 10 MG PO TABS: 10.0000 mg | ORAL_TABLET | Freq: Every day | ORAL | Status: DC

## 2012-03-08 ENCOUNTER — Other Ambulatory Visit: Payer: Self-pay | Admitting: Cardiology

## 2012-03-08 MED ORDER — AMLODIPINE BESYLATE 5 MG PO TABS: 5.0000 mg | ORAL_TABLET | Freq: Every day | ORAL | Status: DC

## 2012-03-08 MED ORDER — ATORVASTATIN CALCIUM 10 MG PO TABS: 10.0000 mg | ORAL_TABLET | Freq: Every day | ORAL | Status: DC

## 2012-03-08 NOTE — Telephone Encounter (Signed)
Fax Received. Refill Completed. Christopher Butler (R.M.A)   

## 2012-03-08 NOTE — Telephone Encounter (Signed)
New problem:   Prime mail  90 days supply with 3 refills.

## 2012-03-12 ENCOUNTER — Other Ambulatory Visit: Payer: Self-pay | Admitting: *Deleted

## 2012-03-12 MED ORDER — ATORVASTATIN CALCIUM 10 MG PO TABS: 10.0000 mg | ORAL_TABLET | Freq: Every day | ORAL | Status: DC

## 2012-03-12 MED ORDER — AMLODIPINE BESYLATE 5 MG PO TABS: 5.0000 mg | ORAL_TABLET | Freq: Every day | ORAL | Status: DC

## 2012-03-22 ENCOUNTER — Ambulatory Visit (INDEPENDENT_AMBULATORY_CARE_PROVIDER_SITE_OTHER): Payer: Medicare Other | Admitting: Pharmacist

## 2012-03-22 DIAGNOSIS — I4891 Unspecified atrial fibrillation: Secondary | ICD-10-CM

## 2012-03-22 DIAGNOSIS — Z7901 Long term (current) use of anticoagulants: Secondary | ICD-10-CM

## 2012-03-22 LAB — POCT INR: INR: 2.1

## 2012-04-12 ENCOUNTER — Other Ambulatory Visit (INDEPENDENT_AMBULATORY_CARE_PROVIDER_SITE_OTHER): Payer: Medicare Other

## 2012-04-12 DIAGNOSIS — R5381 Other malaise: Secondary | ICD-10-CM

## 2012-04-12 DIAGNOSIS — R5383 Other fatigue: Secondary | ICD-10-CM

## 2012-04-12 DIAGNOSIS — E7800 Pure hypercholesterolemia, unspecified: Secondary | ICD-10-CM

## 2012-04-12 LAB — HEPATIC FUNCTION PANEL
ALT: 19 U/L (ref 0–53)
Albumin: 3.8 g/dL (ref 3.5–5.2)
Alkaline Phosphatase: 122 U/L — ABNORMAL HIGH (ref 39–117)
Bilirubin, Direct: 0.2 mg/dL (ref 0.0–0.3)
Total Protein: 6.4 g/dL (ref 6.0–8.3)

## 2012-04-12 LAB — LIPID PANEL
Cholesterol: 133 mg/dL (ref 0–200)
Triglycerides: 120 mg/dL (ref 0.0–149.0)
VLDL: 24 mg/dL (ref 0.0–40.0)

## 2012-04-12 LAB — TESTOSTERONE: Testosterone: 403.22 ng/dL (ref 350.00–890.00)

## 2012-04-12 LAB — BASIC METABOLIC PANEL
BUN: 24 mg/dL — ABNORMAL HIGH (ref 6–23)
Creatinine, Ser: 1.2 mg/dL (ref 0.4–1.5)
GFR: 60.46 mL/min (ref >60.00)
Potassium: 3.8 mEq/L (ref 3.5–5.1)

## 2012-04-14 NOTE — Progress Notes (Signed)
Quick Note:  Please make copy of labs for patient visit. ______ 

## 2012-04-17 ENCOUNTER — Ambulatory Visit (INDEPENDENT_AMBULATORY_CARE_PROVIDER_SITE_OTHER): Payer: Medicare Other | Admitting: *Deleted

## 2012-04-17 ENCOUNTER — Encounter: Payer: Self-pay | Admitting: Cardiology

## 2012-04-17 ENCOUNTER — Other Ambulatory Visit: Payer: Medicare Other

## 2012-04-17 ENCOUNTER — Ambulatory Visit (INDEPENDENT_AMBULATORY_CARE_PROVIDER_SITE_OTHER): Payer: Medicare Other | Admitting: Cardiology

## 2012-04-17 VITALS — BP 132/90 | HR 92 | Resp 18 | Ht 65.0 in | Wt 159.4 lb

## 2012-04-17 DIAGNOSIS — I4891 Unspecified atrial fibrillation: Secondary | ICD-10-CM

## 2012-04-17 DIAGNOSIS — F411 Generalized anxiety disorder: Secondary | ICD-10-CM

## 2012-04-17 DIAGNOSIS — I259 Chronic ischemic heart disease, unspecified: Secondary | ICD-10-CM

## 2012-04-17 DIAGNOSIS — Z7901 Long term (current) use of anticoagulants: Secondary | ICD-10-CM

## 2012-04-17 DIAGNOSIS — F419 Anxiety disorder, unspecified: Secondary | ICD-10-CM

## 2012-04-17 DIAGNOSIS — M545 Low back pain, unspecified: Secondary | ICD-10-CM

## 2012-04-17 DIAGNOSIS — E7800 Pure hypercholesterolemia, unspecified: Secondary | ICD-10-CM

## 2012-04-17 DIAGNOSIS — B354 Tinea corporis: Secondary | ICD-10-CM

## 2012-04-17 LAB — POCT INR: INR: 3

## 2012-04-17 MED ORDER — TOLNAFTATE 1 % EX CREA: TOPICAL_CREAM | Freq: Two times a day (BID) | CUTANEOUS | Status: DC

## 2012-04-17 MED ORDER — LORAZEPAM 0.5 MG PO TABS: 0.5000 mg | ORAL_TABLET | Freq: Every day | ORAL | Status: DC

## 2012-04-17 NOTE — Assessment & Plan Note (Signed)
The patient has not been expressing any angina pectoris.  No nitroglycerin usage.

## 2012-04-17 NOTE — Progress Notes (Signed)
Christopher Butler Date of Birth:  Oct 25, 1924 [facility] [address] [place], [place]  [address] [phone]         Fax   [phone]  History of Present Illness: This pleasant 78 year old gentleman is seen for a scheduled followup office visit. He has a history of remote inferior wall myocardial infarction. He has a history of atrial fibrillation and is on Coumadin. He had an echocardiogram in 2010 showing an ejection fraction of 40-45%. He was seen in our office on 08/24/11 at which time he was found to be in atrial fibrillation.  His most recent echocardiogram 08/25/11 showed an ejection fraction of 35-40% with moderate tricuspid regurgitation.  He is not aware of his arrhythmia. He has been on Coumadin since April 2013. The patient has not been experiencing any chest pain or any increasing shortness of breath. Unfortunately he has started smoking again. Start about the importance of stopping smoking permanently. The patient continues to have some problems with his back. He sees Dr. Sharolyn Douglas. He may need an intraspinal injection and if he does he will need Lovenox bridging.   Current Outpatient Prescriptions  Medication Sig Dispense Refill  . acetaminophen (TYLENOL) 500 MG tablet Take 500 mg by mouth as directed. Taking 2 in the am      . amLODipine (NORVASC) 5 MG tablet Take 1 tablet (5 mg total) by mouth daily.  90 tablet  2  . aspirin 81 MG tablet Take 81 mg by mouth daily.      Marland Kitchen atorvastatin (LIPITOR) 10 MG tablet Take 1 tablet (10 mg total) by mouth daily.  90 tablet  2  . hydrochlorothiazide (MICROZIDE) 12.5 MG capsule Take 1 capsule (12.5 mg total) by mouth every other day.  45 capsule  3  . lisinopril (PRINIVIL) 10 MG tablet Take 1 tablet (10 mg total) by mouth daily.  90 tablet  3  . LORazepam (ATIVAN) 0.5 MG tablet Take 1 tablet (0.5 mg total) by mouth daily.  30 tablet  5  . metoprolol tartrate (LOPRESSOR) 25 MG tablet Take 1 tablet (25 mg total) by mouth 2  (two) times daily.  180 tablet  3  . nitroGLYCERIN (NITROSTAT) 0.4 MG SL tablet Place 1 tablet (0.4 mg total) under the tongue every 5 (five) minutes as needed for chest pain.  25 tablet  prn  . warfarin (COUMADIN) 5 MG tablet Daily or as directed  90 tablet  3  . tolnaftate (TINACTIN) 1 % cream Apply topically 2 (two) times daily.  30 g  1    Allergies  Allergen Reactions  . Ceclor (Cefaclor)   . Erythromycin     Patient Active Problem List  Diagnosis  . Ischemic heart disease  . Tobacco abuse  . Benign hypertensive heart disease without heart failure  . Hypercholesterolemia  . Erectile dysfunction  . Low back pain  . Cholelithiasis  . Atrial fibrillation  . Tinea corporis    History  Smoking status  . Current Some Day Smoker -- 1.0 packs/day  Smokeless tobacco  . Not on file    History  Alcohol Use  . Yes    No family history on file.  Review of Systems: Constitutional: no fever chills diaphoresis or fatigue or change in weight.  Head and neck: no hearing loss, no epistaxis, no photophobia or visual disturbance. Respiratory: No cough, shortness of breath or wheezing. Cardiovascular: No chest pain peripheral edema, palpitations. Gastrointestinal: No abdominal distention, no abdominal pain, no  change in bowel habits hematochezia or melena. Genitourinary: No dysuria, no frequency, no urgency, no nocturia. Musculoskeletal:No arthralgias, no back pain, no gait disturbance or myalgias. Neurological: No dizziness, no headaches, no numbness, no seizures, no syncope, no weakness, no tremors. Hematologic: No lymphadenopathy, no easy bruising. Psychiatric: No confusion, no hallucinations, no sleep disturbance.    Physical Exam: Filed Vitals:   04/17/12 0935  BP: 132/90  Pulse: 92  Resp: 18   the general appearance reveals a well-developed well-nourished gentleman in no distress.The head and neck exam reveals pupils equal and reactive.  Extraocular movements are full.   There is no scleral icterus.  The mouth and pharynx are normal.  The neck is supple.  The carotids reveal no bruits.  The jugular venous pressure is normal.  The  thyroid is not enlarged.  There is no lymphadenopathy.  The chest is clear to percussion and auscultation.  There are no rales or rhonchi.  Expansion of the chest is symmetrical.  The precordium is quiet.  The first heart sound is normal.  The second heart sound is physiologically split.  There is no murmur gallop rub or click.  There is no abnormal lift or heave.  The abdomen is soft and nontender.  The bowel sounds are normal.  The liver and spleen are not enlarged.  There are no abdominal masses.  There are no abdominal bruits.  Extremities reveal good pedal pulses.  There is no phlebitis or edema.  There is no cyanosis or clubbing.  Strength is normal and symmetrical in all extremities.  There is no lateralizing weakness.  There are no sensory deficits.  The skin is warm and dry.  There is a rash behind his right ear suggestive of tinea corporis   EKG shows atrial fibrillation and nonspecific ST-T wave changes  Assessment / Plan: Patient was once again counseled about the need to stop smoking.  Continue same medication and be rechecked in 4 months for followup office visit lipid panel hepatic function panel and basal metabolic panel

## 2012-04-17 NOTE — Assessment & Plan Note (Signed)
Patient continues to have a lot of problems with low back pain.  He has tried water aerobics which seems to help his back pain but he is worn out the next day from the increased exercise.

## 2012-04-17 NOTE — Patient Instructions (Signed)
Apply Tinactin cream to area on right side side of your neck   Keep all other medications the same  Your physician wants you to follow-up in: 4 months with fasting labs (lp/bmet/hfp)  You will receive a reminder letter in the mail two months in advance. If you don't receive a letter, please call our office to schedule the follow-up appointment.

## 2012-04-17 NOTE — Assessment & Plan Note (Signed)
The patient has what appears to be ringworm or tinea corporis behind his right ear.  We will prescribe Tinactin cream

## 2012-05-16 ENCOUNTER — Ambulatory Visit (INDEPENDENT_AMBULATORY_CARE_PROVIDER_SITE_OTHER): Payer: Medicare Other | Admitting: *Deleted

## 2012-05-16 DIAGNOSIS — I4891 Unspecified atrial fibrillation: Secondary | ICD-10-CM

## 2012-05-16 DIAGNOSIS — Z7901 Long term (current) use of anticoagulants: Secondary | ICD-10-CM

## 2012-05-16 LAB — POCT INR: INR: 2.1

## 2012-06-27 ENCOUNTER — Ambulatory Visit (INDEPENDENT_AMBULATORY_CARE_PROVIDER_SITE_OTHER): Payer: Medicare Other | Admitting: *Deleted

## 2012-06-27 DIAGNOSIS — I4891 Unspecified atrial fibrillation: Secondary | ICD-10-CM

## 2012-06-27 DIAGNOSIS — Z7901 Long term (current) use of anticoagulants: Secondary | ICD-10-CM

## 2012-06-27 LAB — POCT INR: INR: 2.5

## 2012-08-08 ENCOUNTER — Ambulatory Visit (INDEPENDENT_AMBULATORY_CARE_PROVIDER_SITE_OTHER): Payer: Medicare Other

## 2012-08-08 DIAGNOSIS — I4891 Unspecified atrial fibrillation: Secondary | ICD-10-CM

## 2012-08-08 DIAGNOSIS — Z7901 Long term (current) use of anticoagulants: Secondary | ICD-10-CM

## 2012-08-16 ENCOUNTER — Other Ambulatory Visit (INDEPENDENT_AMBULATORY_CARE_PROVIDER_SITE_OTHER): Payer: Medicare Other

## 2012-08-16 DIAGNOSIS — E7800 Pure hypercholesterolemia, unspecified: Secondary | ICD-10-CM

## 2012-08-16 LAB — HEPATIC FUNCTION PANEL
AST: 18 U/L (ref 0–37)
Albumin: 3.6 g/dL (ref 3.5–5.2)
Alkaline Phosphatase: 116 U/L (ref 39–117)
Total Protein: 6.6 g/dL (ref 6.0–8.3)

## 2012-08-16 LAB — BASIC METABOLIC PANEL
Calcium: 9.2 mg/dL (ref 8.4–10.5)
Creatinine, Ser: 1.5 mg/dL (ref 0.4–1.5)
GFR: 47.88 mL/min — ABNORMAL LOW (ref >60.00)

## 2012-08-16 LAB — LIPID PANEL
Cholesterol: 140 mg/dL (ref 0–200)
Triglycerides: 128 mg/dL (ref 0.0–149.0)

## 2012-08-16 NOTE — Progress Notes (Signed)
Quick Note:  Please make copy of labs for patient visit. ______ 

## 2012-08-19 ENCOUNTER — Other Ambulatory Visit: Payer: Medicare Other

## 2012-08-20 ENCOUNTER — Encounter: Payer: Self-pay | Admitting: Cardiology

## 2012-08-20 ENCOUNTER — Ambulatory Visit (INDEPENDENT_AMBULATORY_CARE_PROVIDER_SITE_OTHER): Payer: Medicare Other | Admitting: Cardiology

## 2012-08-20 VITALS — BP 124/86 | HR 99 | Ht 65.0 in | Wt 159.8 lb

## 2012-08-20 DIAGNOSIS — E7800 Pure hypercholesterolemia, unspecified: Secondary | ICD-10-CM

## 2012-08-20 DIAGNOSIS — I4891 Unspecified atrial fibrillation: Secondary | ICD-10-CM

## 2012-08-20 DIAGNOSIS — I259 Chronic ischemic heart disease, unspecified: Secondary | ICD-10-CM

## 2012-08-20 NOTE — Assessment & Plan Note (Signed)
He is tolerating his atrial fibrillation well.  He is really not aware of his rhythm.  For this reason we have not attempted cardioversion.  He remains on rate control and warfarin.

## 2012-08-20 NOTE — Assessment & Plan Note (Signed)
The patient is not having any recurrent chest pain or angina 

## 2012-08-20 NOTE — Patient Instructions (Addendum)
Your physician recommends that you continue on your current medications as directed. Please refer to the Current Medication list given to you today.  Your physician recommends that you schedule a follow-up appointment in: 4 months with fasting labs (lp/bmet/hfp/cbc) and ekg  INCREASE YOUR WATER INTAKE

## 2012-08-20 NOTE — Progress Notes (Signed)
Christopher Butler Date of Birth:  [birth year] [facility] [address] [place], [place]  [address] [phone]         Fax   [phone]  History of Present Illness: This pleasant 79 year old gentleman is seen for a scheduled followup office visit. He has a history of remote inferior wall myocardial infarction. He has a history of permanent atrial fibrillation and is on Coumadin. He had an echocardiogram in 2010 showing an ejection fraction of 40-45%. He was seen in our office on 08/24/11 at which time he was found to be in atrial fibrillation. His most recent echocardiogram 08/25/11 showed an ejection fraction of 35-40% with moderate tricuspid regurgitation. He is not aware of his arrhythmia. He has been on Coumadin since April 2013. The patient has not been experiencing any chest pain or any increasing shortness of breath. Unfortunately he has started smoking again.  The patient continues to have some problems with his back. He sees Dr. Sharolyn Douglas.  Previously he stopped his warfarin at at Lovenox bridging to allow for an intraspinal injection.  His back pain is worsening thinks that he may need another spinal ejection and he may want to get it done before his beach vacation in mid June 2014.   Current Outpatient Prescriptions  Medication Sig Dispense Refill  . acetaminophen (TYLENOL) 500 MG tablet Take 500 mg by mouth as directed. Taking 2 in the am      . amLODipine (NORVASC) 5 MG tablet Take 1 tablet (5 mg total) by mouth daily.  90 tablet  2  . aspirin 81 MG tablet Take 81 mg by mouth daily.      Marland Kitchen atorvastatin (LIPITOR) 10 MG tablet Take 1 tablet (10 mg total) by mouth daily.  90 tablet  2  . hydrochlorothiazide (MICROZIDE) 12.5 MG capsule Take 1 capsule (12.5 mg total) by mouth every other day.  45 capsule  3  . lisinopril (PRINIVIL) 10 MG tablet Take 1 tablet (10 mg total) by mouth daily.  90 tablet  3  . LORazepam (ATIVAN) 0.5 MG tablet Take 1 tablet (0.5 mg total) by  mouth daily.  30 tablet  5  . metoprolol tartrate (LOPRESSOR) 25 MG tablet Take 1 tablet (25 mg total) by mouth 2 (two) times daily.  180 tablet  3  . tolnaftate (TINACTIN) 1 % cream Apply topically 2 (two) times daily.  30 g  1  . warfarin (COUMADIN) 5 MG tablet Daily or as directed  90 tablet  3  . nitroGLYCERIN (NITROSTAT) 0.4 MG SL tablet Place 1 tablet (0.4 mg total) under the tongue every 5 (five) minutes as needed for chest pain.  25 tablet  prn   No current facility-administered medications for this visit.    Allergies  Allergen Reactions  . Ceclor (Cefaclor)   . Erythromycin     Patient Active Problem List  Diagnosis  . Ischemic heart disease  . Tobacco abuse  . Benign hypertensive heart disease without heart failure  . Hypercholesterolemia  . Erectile dysfunction  . Low back pain  . Cholelithiasis  . Atrial fibrillation  . Tinea corporis    History  Smoking status  . Current Some Day Smoker -- 1.00 packs/day  Smokeless tobacco  . Not on file    History  Alcohol Use  . Yes    No family history on file.  Review of Systems: Constitutional: no fever chills diaphoresis or fatigue or change in weight.  Head and neck: no  hearing loss, no epistaxis, no photophobia or visual disturbance. Respiratory: No cough, shortness of breath or wheezing. Cardiovascular: No chest pain peripheral edema, palpitations. Gastrointestinal: No abdominal distention, no abdominal pain, no change in bowel habits hematochezia or melena. Genitourinary: No dysuria, no frequency, no urgency, no nocturia. Musculoskeletal:No arthralgias, no back pain, no gait disturbance or myalgias. Neurological: No dizziness, no headaches, no numbness, no seizures, no syncope, no weakness, no tremors. Hematologic: No lymphadenopathy, no easy bruising. Psychiatric: No confusion, no hallucinations, no sleep disturbance.    Physical Exam: Filed Vitals:   08/20/12 0934  BP: 124/86  Pulse: 99   the  general appearance reveals a well-developed elderly gentleman in no distress.The head and neck exam reveals pupils equal and reactive.  Extraocular movements are full.  There is no scleral icterus.  The mouth and pharynx are normal.  The neck is supple.  The carotids reveal no bruits.  The jugular venous pressure is normal.  The  thyroid is not enlarged.  There is no lymphadenopathy.  The chest is clear to percussion and auscultation.  There are no rales or rhonchi.  Expansion of the chest is symmetrical.  The precordium is quiet.  The pulse is irregularly and The first heart sound is normal.  The second heart sound is physiologically split.  There is no murmur gallop rub or click.  There is no abnormal lift or heave.  The abdomen is soft and nontender.  The bowel sounds are normal.  The liver and spleen are not enlarged.  There are no abdominal masses.  There are no abdominal bruits.  Extremities reveal good pedal pulses.  There is no phlebitis or edema.  There is no cyanosis or clubbing.  Strength is normal and symmetrical in all extremities.  There is no lateralizing weakness.  There are no sensory deficits.  The skin is warm and dry.  There is no rash.     Assessment / Plan:  Continue same medication.  We reviewed his lab work and his kidneys are somewhat dry and he needs to increase his water intake. Recheck in 4 months for office visit EKG lipid panel hepatic function panel basal metabolic panel CBC

## 2012-08-20 NOTE — Assessment & Plan Note (Signed)
The patient is not having any symptoms from his gallbladder

## 2012-09-19 ENCOUNTER — Ambulatory Visit (INDEPENDENT_AMBULATORY_CARE_PROVIDER_SITE_OTHER): Payer: Medicare Other

## 2012-09-19 DIAGNOSIS — I4891 Unspecified atrial fibrillation: Secondary | ICD-10-CM

## 2012-09-19 DIAGNOSIS — Z7901 Long term (current) use of anticoagulants: Secondary | ICD-10-CM

## 2012-09-19 LAB — POCT INR: INR: 3.8

## 2012-10-11 ENCOUNTER — Ambulatory Visit (INDEPENDENT_AMBULATORY_CARE_PROVIDER_SITE_OTHER): Payer: Medicare Other | Admitting: *Deleted

## 2012-10-11 DIAGNOSIS — I4891 Unspecified atrial fibrillation: Secondary | ICD-10-CM

## 2012-10-11 DIAGNOSIS — Z7901 Long term (current) use of anticoagulants: Secondary | ICD-10-CM

## 2012-11-15 ENCOUNTER — Ambulatory Visit (INDEPENDENT_AMBULATORY_CARE_PROVIDER_SITE_OTHER): Payer: Medicare Other | Admitting: *Deleted

## 2012-11-15 DIAGNOSIS — I4891 Unspecified atrial fibrillation: Secondary | ICD-10-CM

## 2012-11-15 DIAGNOSIS — Z7901 Long term (current) use of anticoagulants: Secondary | ICD-10-CM

## 2012-12-13 ENCOUNTER — Ambulatory Visit (INDEPENDENT_AMBULATORY_CARE_PROVIDER_SITE_OTHER): Payer: Medicare Other | Admitting: *Deleted

## 2012-12-13 DIAGNOSIS — I4891 Unspecified atrial fibrillation: Secondary | ICD-10-CM

## 2012-12-13 DIAGNOSIS — Z7901 Long term (current) use of anticoagulants: Secondary | ICD-10-CM

## 2012-12-18 ENCOUNTER — Other Ambulatory Visit (INDEPENDENT_AMBULATORY_CARE_PROVIDER_SITE_OTHER): Payer: Medicare Other

## 2012-12-18 DIAGNOSIS — E7800 Pure hypercholesterolemia, unspecified: Secondary | ICD-10-CM

## 2012-12-18 DIAGNOSIS — I4891 Unspecified atrial fibrillation: Secondary | ICD-10-CM

## 2012-12-18 LAB — CBC WITH DIFFERENTIAL/PLATELET
Basophils Relative: 0.4 % (ref 0.0–3.0)
Eosinophils Relative: 1.2 % (ref 0.0–5.0)
HCT: 43.6 % (ref 39.0–52.0)
Lymphs Abs: 2.2 10*3/uL (ref 0.7–4.0)
MCV: 100.4 fl — ABNORMAL HIGH (ref 78.0–100.0)
Monocytes Absolute: 0.9 10*3/uL (ref 0.1–1.0)
Neutro Abs: 5.9 10*3/uL (ref 1.4–7.7)
RBC: 4.35 Mil/uL (ref 4.22–5.81)
WBC: 9.1 10*3/uL (ref 4.5–10.5)

## 2012-12-18 LAB — LIPID PANEL
Cholesterol: 141 mg/dL (ref 0–200)
LDL Cholesterol: 73 mg/dL (ref 0–99)
VLDL: 28 mg/dL (ref 0.0–40.0)

## 2012-12-18 LAB — HEPATIC FUNCTION PANEL
Bilirubin, Direct: 0.2 mg/dL (ref 0.0–0.3)
Total Protein: 6.8 g/dL (ref 6.0–8.3)

## 2012-12-18 LAB — BASIC METABOLIC PANEL
BUN: 18 mg/dL (ref 6–23)
Chloride: 101 mEq/L (ref 96–112)
Potassium: 3.9 mEq/L (ref 3.5–5.1)

## 2012-12-18 NOTE — Progress Notes (Signed)
Quick Note:  Please make copy of labs for patient visit. ______ 

## 2012-12-20 ENCOUNTER — Ambulatory Visit (INDEPENDENT_AMBULATORY_CARE_PROVIDER_SITE_OTHER): Payer: Medicare Other | Admitting: Cardiology

## 2012-12-20 ENCOUNTER — Encounter: Payer: Self-pay | Admitting: Cardiology

## 2012-12-20 VITALS — BP 118/90 | HR 82 | Ht 65.0 in | Wt 156.8 lb

## 2012-12-20 DIAGNOSIS — E7800 Pure hypercholesterolemia, unspecified: Secondary | ICD-10-CM

## 2012-12-20 DIAGNOSIS — I119 Hypertensive heart disease without heart failure: Secondary | ICD-10-CM

## 2012-12-20 DIAGNOSIS — I4891 Unspecified atrial fibrillation: Secondary | ICD-10-CM

## 2012-12-20 DIAGNOSIS — H9313 Tinnitus, bilateral: Secondary | ICD-10-CM

## 2012-12-20 DIAGNOSIS — H9319 Tinnitus, unspecified ear: Secondary | ICD-10-CM

## 2012-12-20 DIAGNOSIS — I259 Chronic ischemic heart disease, unspecified: Secondary | ICD-10-CM

## 2012-12-20 DIAGNOSIS — E871 Hypo-osmolality and hyponatremia: Secondary | ICD-10-CM

## 2012-12-20 NOTE — Progress Notes (Signed)
Christopher Butler Date of Birth:  Apr 28, 1925 [facility] [address] [place], [place]  [address] [phone]         Fax   [phone]  History of Present Illness: This pleasant 79 year old gentleman is seen for a scheduled followup office visit. He has a history of remote inferior wall myocardial infarction. He has a history of permanent atrial fibrillation and is on Coumadin. He had an echocardiogram in 2010 showing an ejection fraction of 40-45%. He was seen in our office on 08/24/11 at which time he was found to be in atrial fibrillation. His most recent echocardiogram 08/25/11 showed an ejection fraction of 35-40% with moderate tricuspid regurgitation. He is not aware of his arrhythmia. He has been on Coumadin since April 2013. The patient has not been experiencing any chest pain or any increasing shortness of breath.  The patient continues to have some problems with his back. He sees a Land.. Previously he stopped his warfarin at at Lovenox bridging to allow for an intraspinal injection by Dr. Noel Gerold.   Current Outpatient Prescriptions  Medication Sig Dispense Refill  . acetaminophen (TYLENOL) 500 MG tablet Take 500 mg by mouth as directed. Taking 2 in the am      . amLODipine (NORVASC) 5 MG tablet Take 1 tablet (5 mg total) by mouth daily.  90 tablet  2  . aspirin 81 MG tablet Take 81 mg by mouth daily.      Marland Kitchen atorvastatin (LIPITOR) 10 MG tablet Take 1 tablet (10 mg total) by mouth daily.  90 tablet  2  . hydrochlorothiazide (MICROZIDE) 12.5 MG capsule Take 1 capsule (12.5 mg total) by mouth every other day.  45 capsule  3  . lisinopril (PRINIVIL) 10 MG tablet Take 1 tablet (10 mg total) by mouth daily.  90 tablet  3  . LORazepam (ATIVAN) 0.5 MG tablet Take 1 tablet (0.5 mg total) by mouth daily.  30 tablet  5  . metoprolol tartrate (LOPRESSOR) 25 MG tablet Take 1 tablet (25 mg total) by mouth 2 (two) times daily.  180 tablet  3  . nitroGLYCERIN (NITROSTAT)  0.4 MG SL tablet Place 1 tablet (0.4 mg total) under the tongue every 5 (five) minutes as needed for chest pain.  25 tablet  prn  . tolnaftate (TINACTIN) 1 % cream Apply 1 application topically as needed.      . warfarin (COUMADIN) 5 MG tablet Daily or as directed  90 tablet  3   No current facility-administered medications for this visit.    Allergies  Allergen Reactions  . Ceclor [Cefaclor]   . Erythromycin     Patient Active Problem List   Diagnosis Date Noted  . Ischemic heart disease 08/03/2010    Priority: High  . Tobacco abuse 08/03/2010    Priority: Medium  . Benign hypertensive heart disease without heart failure 08/03/2010    Priority: Medium  . Tinnitus 12/20/2012  . Hyposmolality and/or hyponatremia 12/20/2012  . Tinea corporis 04/17/2012  . Atrial fibrillation 10/24/2011  . Hypercholesterolemia 08/03/2010  . Erectile dysfunction 08/03/2010  . Low back pain 08/03/2010  . Cholelithiasis 08/03/2010    History  Smoking status  . Current Some Day Smoker -- 1.00 packs/day  Smokeless tobacco  . Not on file    History  Alcohol Use  . Yes    No family history on file.  Review of Systems: Constitutional: no fever chills diaphoresis or fatigue or change in weight.  Head and neck:  no hearing loss, no epistaxis, no photophobia or visual disturbance. Respiratory: No cough, shortness of breath or wheezing. Cardiovascular: No chest pain peripheral edema, palpitations. Gastrointestinal: No abdominal distention, no abdominal pain, no change in bowel habits hematochezia or melena. Genitourinary: No dysuria, no frequency, no urgency, no nocturia. Musculoskeletal:No arthralgias, no back pain, no gait disturbance or myalgias. Neurological: No dizziness, no headaches, no numbness, no seizures, no syncope, no weakness, no tremors. Hematologic: No lymphadenopathy, no easy bruising. Psychiatric: No confusion, no hallucinations, no sleep disturbance.    Physical  Exam: Filed Vitals:   12/20/12 1039  BP: 118/90  Pulse: 82   the general appearance reveals a well-developed well-nourished elderly gentleman in no distress.The head and neck exam reveals pupils equal and reactive.  Extraocular movements are full.  There is no scleral icterus.  The mouth and pharynx are normal.  The neck is supple.  The carotids reveal no bruits.  The jugular venous pressure is normal.  The  thyroid is not enlarged.  There is no lymphadenopathy.  The chest is clear to percussion and auscultation.  There are no rales or rhonchi.  Expansion of the chest is symmetrical.  The pulse is slightly irregular in atrial fibrillation. The precordium is quiet.  The first heart sound is normal.  The second heart sound is physiologically split.  There is no gallop rub or click.  There is a soft systolic ejection murmur at the left sternal edge.  There is no abnormal lift or heave.  The abdomen is soft and nontender.  The bowel sounds are normal.  The liver and spleen are not enlarged.  There are no abdominal masses.  There are no abdominal bruits.  Extremities reveal good pedal pulses.  There is no phlebitis or edema.  There is no cyanosis or clubbing.  Strength is normal and symmetrical in all extremities.  There is no lateralizing weakness.  There are no sensory deficits.  The skin is warm and dry.  There is no rash.    Assessment / Plan: Overall the patient appears to be doing well.  He will continue same medication.  He is trying to make a decision about what to do about his chronic back pain with right leg radiation. Recheck here in 4 months for followup office visit EKG lipid panel hepatic function panel and basal metabolic panel. He mentioned that Dr. Felipa Eth have him get a followup chest x-ray in June which he states was okay.

## 2012-12-20 NOTE — Assessment & Plan Note (Signed)
The patient has a history of hypercholesterolemia and is on Lipitor 10 mg a day.  He has not had any myalgias from Lipitor.  Blood work remained satisfactory.

## 2012-12-20 NOTE — Assessment & Plan Note (Signed)
Blood pressure is labile but generally has been staying in the normal range.  He has not been having any headaches or dizzy spells or syncope.  No symptoms of CHF.

## 2012-12-20 NOTE — Assessment & Plan Note (Signed)
The patient has a history of atrial fibrillation with a controlled ventricular response.  He is on long-term Coumadin.. is not having any problems from the Coumadin.  He has not had any TIA symptoms.

## 2012-12-20 NOTE — Assessment & Plan Note (Signed)
The patient has had no recurrent chest pain or angina.  He has never taken any sublingual nitroglycerin.

## 2012-12-20 NOTE — Patient Instructions (Addendum)
Your physician recommends that you continue on your current medications as directed. Please refer to the Current Medication list given to you today.  Your physician recommends that you return for lab work in: 4 months, LP, HFP, BMET  Your physician wants you to follow-up in: 4 months with Dr. Patty Sermons. You will receive a reminder letter in the mail two months in advance. If you don't receive a letter, please call our office to schedule the follow-up appointment.

## 2013-01-01 ENCOUNTER — Other Ambulatory Visit: Payer: Self-pay | Admitting: Cardiology

## 2013-01-01 DIAGNOSIS — F419 Anxiety disorder, unspecified: Secondary | ICD-10-CM

## 2013-01-10 ENCOUNTER — Other Ambulatory Visit: Payer: Self-pay

## 2013-01-10 DIAGNOSIS — I4891 Unspecified atrial fibrillation: Secondary | ICD-10-CM

## 2013-01-10 DIAGNOSIS — I2510 Atherosclerotic heart disease of native coronary artery without angina pectoris: Secondary | ICD-10-CM

## 2013-01-10 MED ORDER — LISINOPRIL 10 MG PO TABS: 10.0000 mg | ORAL_TABLET | Freq: Every day | ORAL | Status: DC

## 2013-01-10 MED ORDER — METOPROLOL TARTRATE 25 MG PO TABS: 25.0000 mg | ORAL_TABLET | Freq: Two times a day (BID) | ORAL | Status: DC

## 2013-01-24 ENCOUNTER — Ambulatory Visit (INDEPENDENT_AMBULATORY_CARE_PROVIDER_SITE_OTHER): Payer: Medicare Other

## 2013-01-24 DIAGNOSIS — Z7901 Long term (current) use of anticoagulants: Secondary | ICD-10-CM

## 2013-01-24 DIAGNOSIS — I4891 Unspecified atrial fibrillation: Secondary | ICD-10-CM

## 2013-01-24 LAB — POCT INR: INR: 2.5

## 2013-03-07 ENCOUNTER — Ambulatory Visit (INDEPENDENT_AMBULATORY_CARE_PROVIDER_SITE_OTHER): Payer: Medicare Other | Admitting: Pharmacist

## 2013-03-07 DIAGNOSIS — I4891 Unspecified atrial fibrillation: Secondary | ICD-10-CM

## 2013-03-07 DIAGNOSIS — Z7901 Long term (current) use of anticoagulants: Secondary | ICD-10-CM

## 2013-03-11 ENCOUNTER — Other Ambulatory Visit: Payer: Self-pay | Admitting: *Deleted

## 2013-03-11 ENCOUNTER — Other Ambulatory Visit: Payer: Self-pay

## 2013-03-11 DIAGNOSIS — I4891 Unspecified atrial fibrillation: Secondary | ICD-10-CM

## 2013-03-11 MED ORDER — WARFARIN SODIUM 5 MG PO TABS: ORAL_TABLET | ORAL | Status: DC

## 2013-03-11 MED ORDER — HYDROCHLOROTHIAZIDE 12.5 MG PO CAPS: 12.5000 mg | ORAL_CAPSULE | ORAL | Status: DC

## 2013-04-08 ENCOUNTER — Telehealth: Payer: Self-pay | Admitting: Cardiology

## 2013-04-08 NOTE — Telephone Encounter (Signed)
New Message  Per pt// requests lab work 12/19 // pleas enter orders if needed// SR

## 2013-04-08 NOTE — Telephone Encounter (Signed)
Labs already ordered in system

## 2013-04-17 ENCOUNTER — Ambulatory Visit: Payer: Medicare Other | Admitting: Cardiology

## 2013-04-18 ENCOUNTER — Ambulatory Visit (INDEPENDENT_AMBULATORY_CARE_PROVIDER_SITE_OTHER): Payer: Medicare Other | Admitting: Cardiology

## 2013-04-18 ENCOUNTER — Ambulatory Visit (INDEPENDENT_AMBULATORY_CARE_PROVIDER_SITE_OTHER): Payer: Medicare Other | Admitting: *Deleted

## 2013-04-18 ENCOUNTER — Encounter: Payer: Self-pay | Admitting: Cardiology

## 2013-04-18 ENCOUNTER — Other Ambulatory Visit: Payer: Medicare Other

## 2013-04-18 VITALS — BP 140/80 | HR 89 | Ht 65.0 in | Wt 159.0 lb

## 2013-04-18 DIAGNOSIS — I119 Hypertensive heart disease without heart failure: Secondary | ICD-10-CM

## 2013-04-18 DIAGNOSIS — F17200 Nicotine dependence, unspecified, uncomplicated: Secondary | ICD-10-CM

## 2013-04-18 DIAGNOSIS — E7800 Pure hypercholesterolemia, unspecified: Secondary | ICD-10-CM

## 2013-04-18 DIAGNOSIS — N401 Enlarged prostate with lower urinary tract symptoms: Secondary | ICD-10-CM

## 2013-04-18 DIAGNOSIS — H9313 Tinnitus, bilateral: Secondary | ICD-10-CM

## 2013-04-18 DIAGNOSIS — I4891 Unspecified atrial fibrillation: Secondary | ICD-10-CM

## 2013-04-18 DIAGNOSIS — Z7901 Long term (current) use of anticoagulants: Secondary | ICD-10-CM

## 2013-04-18 DIAGNOSIS — E871 Hypo-osmolality and hyponatremia: Secondary | ICD-10-CM

## 2013-04-18 DIAGNOSIS — R5381 Other malaise: Secondary | ICD-10-CM

## 2013-04-18 DIAGNOSIS — H9319 Tinnitus, unspecified ear: Secondary | ICD-10-CM

## 2013-04-18 DIAGNOSIS — I259 Chronic ischemic heart disease, unspecified: Secondary | ICD-10-CM

## 2013-04-18 DIAGNOSIS — N138 Other obstructive and reflux uropathy: Secondary | ICD-10-CM

## 2013-04-18 DIAGNOSIS — Z0000 Encounter for general adult medical examination without abnormal findings: Secondary | ICD-10-CM

## 2013-04-18 DIAGNOSIS — Z720 Tobacco use: Secondary | ICD-10-CM

## 2013-04-18 LAB — BASIC METABOLIC PANEL
CO2: 25 mEq/L (ref 19–32)
Chloride: 105 mEq/L (ref 96–112)
Creatinine, Ser: 1.1 mg/dL (ref 0.4–1.5)
Potassium: 3.9 mEq/L (ref 3.5–5.1)
Sodium: 137 mEq/L (ref 135–145)

## 2013-04-18 LAB — LIPID PANEL
HDL: 40.9 mg/dL (ref >39.00)
LDL Cholesterol: 80 mg/dL (ref 0–99)
Total CHOL/HDL Ratio: 4
Triglycerides: 149 mg/dL (ref 0.0–149.0)

## 2013-04-18 LAB — HEPATIC FUNCTION PANEL
Albumin: 3.9 g/dL (ref 3.5–5.2)
Alkaline Phosphatase: 119 U/L — ABNORMAL HIGH (ref 39–117)
Bilirubin, Direct: 0.1 mg/dL (ref 0.0–0.3)

## 2013-04-18 LAB — POCT INR: INR: 2.8

## 2013-04-18 NOTE — Progress Notes (Signed)
Christopher Butler Date of Birth:  1924/09/27 [address] [place], [place]  [address] [phone]         Fax   [phone]  History of Present Illness: This pleasant 79 year old gentleman is seen for a scheduled followup office visit. He has a history of remote inferior wall myocardial infarction. He has a history of permanent atrial fibrillation and is on Coumadin. He had an echocardiogram in 2010 showing an ejection fraction of 40-45%. He was seen in our office on 08/24/11 at which time he was found to be in atrial fibrillation. His most recent echocardiogram 08/25/11 showed an ejection fraction of 35-40% with moderate tricuspid regurgitation. He is not aware of his arrhythmia. He has been on Coumadin since April 2013. The patient has not been experiencing any chest pain or any increasing shortness of breath.  The patient continues to have some problems with his back. He sees a Land.. Previously he stopped his warfarin at at Lovenox bridging to allow for an intraspinal injection by Dr. Noel Gerold.  He has been having some symptoms of BPH with nocturia and we are checking a PSA today  Current Outpatient Prescriptions  Medication Sig Dispense Refill  . acetaminophen (TYLENOL) 500 MG tablet Take 500 mg by mouth as directed. Taking 2 in the am      . amLODipine (NORVASC) 5 MG tablet Take 1 tablet (5 mg total) by mouth daily.  90 tablet  2  . aspirin 81 MG tablet Take 81 mg by mouth daily.      Marland Kitchen atorvastatin (LIPITOR) 10 MG tablet Take 1 tablet (10 mg total) by mouth daily.  90 tablet  2  . hydrochlorothiazide (MICROZIDE) 12.5 MG capsule Take 1 capsule (12.5 mg total) by mouth every other day.  45 capsule  3  . lisinopril (PRINIVIL,ZESTRIL) 10 MG tablet Take 1 tablet (10 mg total) by mouth daily.  90 tablet  3  . LORazepam (ATIVAN) 0.5 MG tablet TAKE 1 TABLET BY MOUTH DAILY AS NEEDED  30 tablet  5  . metoprolol tartrate (LOPRESSOR) 25 MG tablet Take 1 tablet (25 mg total) by  mouth 2 (two) times daily.  180 tablet  3  . nitroGLYCERIN (NITROSTAT) 0.4 MG SL tablet Place 0.4 mg under the tongue every 5 (five) minutes as needed for chest pain.      Marland Kitchen tolnaftate (TINACTIN) 1 % cream Apply 1 application topically as needed.      . warfarin (COUMADIN) 5 MG tablet Daily or as directed  90 tablet  1   No current facility-administered medications for this visit.    Allergies  Allergen Reactions  . Ceclor [Cefaclor]   . Erythromycin     Patient Active Problem List   Diagnosis Date Noted  . Ischemic heart disease 08/03/2010    Priority: High  . Tobacco abuse 08/03/2010    Priority: Medium  . Benign hypertensive heart disease without heart failure 08/03/2010    Priority: Medium  . Tinnitus 12/20/2012  . Hyposmolality and/or hyponatremia 12/20/2012  . Tinea corporis 04/17/2012  . Atrial fibrillation 10/24/2011  . Hypercholesterolemia 08/03/2010  . Erectile dysfunction 08/03/2010  . Low back pain 08/03/2010  . Cholelithiasis 08/03/2010    History  Smoking status  . Current Some Day Smoker -- 1.00 packs/day  Smokeless tobacco  . Not on file    History  Alcohol Use  . Yes    No family history on file.  Review of Systems: Constitutional: no fever chills  diaphoresis or fatigue or change in weight.  Head and neck: no hearing loss, no epistaxis, no photophobia or visual disturbance. Respiratory: No cough, shortness of breath or wheezing. Cardiovascular: No chest pain peripheral edema, palpitations. Gastrointestinal: No abdominal distention, no abdominal pain, no change in bowel habits hematochezia or melena. Genitourinary: No dysuria, no frequency, no urgency, no nocturia. Musculoskeletal:No arthralgias, no back pain, no gait disturbance or myalgias. Neurological: No dizziness, no headaches, no numbness, no seizures, no syncope, no weakness, no tremors. Hematologic: No lymphadenopathy, no easy bruising. Psychiatric: No confusion, no hallucinations, no  sleep disturbance.    Physical Exam: Filed Vitals:   04/18/13 1017  BP: 140/80  Pulse: 89   the general appearance reveals a well-developed well-nourished elderly gentleman in no distress.The head and neck exam reveals pupils equal and reactive.  Extraocular movements are full.  There is no scleral icterus.  The mouth and pharynx are normal.  The neck is supple.  The carotids reveal no bruits.  The jugular venous pressure is normal.  The  thyroid is not enlarged.  There is no lymphadenopathy.  The chest is clear to percussion and auscultation.  There are no rales or rhonchi.  Expansion of the chest is symmetrical.  The pulse is slightly irregular in atrial fibrillation. The precordium is quiet.  The first heart sound is normal.  The second heart sound is physiologically split.  There is no gallop rub or click.  There is a soft systolic ejection murmur at the left sternal edge.  There is no abnormal lift or heave.  The abdomen is soft and nontender.  The bowel sounds are normal.  The liver and spleen are not enlarged.  There are no abdominal masses.  There are no abdominal bruits.  Extremities reveal good pedal pulses.  There is no phlebitis or edema.  There is no cyanosis or clubbing.  Strength is normal and symmetrical in all extremities.  There is no lateralizing weakness.  There are no sensory deficits.  The skin is warm and dry.  There is no rash.  EKG today shows atrial fibrillation with controlled ventricular response.  There are nonspecific ST-T wave changes.  No significant change since 12/27/11  Assessment / Plan: Continue same medication.  I gave him a flu shot today.  Recheck in 4 months for office visit lipid panel hepatic function panel and basal metabolic panel

## 2013-04-18 NOTE — Assessment & Plan Note (Signed)
The patient has not been experiencing any chest pain or angina.  He is not getting as much physical exertion because of his back pain

## 2013-04-18 NOTE — Patient Instructions (Signed)
Will obtain labs today and call you with the results (LP.[id number].PSA)  Your physician recommends that you continue on your current medications as directed. Please refer to the Current Medication list given to you today.  Your physician wants you to follow-up in: 4 months with fasting labs (lp/bmet/hfp)  You will receive a reminder letter in the mail two months in advance. If you don't receive a letter, please call our office to schedule the follow-up appointment.   OK FOR FLU VACCINE TODAY

## 2013-04-18 NOTE — Assessment & Plan Note (Signed)
The patient is down to one half pack of cigarettes a day.  I encouraged him to quit altogether.

## 2013-04-18 NOTE — Progress Notes (Signed)
Quick Note:  Please report to patient. The recent labs are stable. Continue same medication and careful diet. PSA 1.62 normal ______

## 2013-04-18 NOTE — Assessment & Plan Note (Signed)
Patient is on low-dose Lipitor 10 mg daily for his hypercholesterolemia.  Blood work today is pending.  Continue same medication for now.

## 2013-04-21 ENCOUNTER — Telehealth: Payer: Self-pay | Admitting: *Deleted

## 2013-04-21 NOTE — Telephone Encounter (Signed)
Message copied by Burnell Blanks on Mon Apr 21, 2013  5:15 PM ------      Message from: Cassell Clement      Created: Fri Apr 18, 2013  9:06 PM       Please report to patient.  The recent labs are stable. Continue same medication and careful diet. PSA 1.62 normal ------

## 2013-04-21 NOTE — Telephone Encounter (Signed)
Advised patient of lab results  

## 2013-05-13 ENCOUNTER — Other Ambulatory Visit: Payer: Self-pay

## 2013-05-13 MED ORDER — ATORVASTATIN CALCIUM 10 MG PO TABS
10.0000 mg | ORAL_TABLET | Freq: Every day | ORAL | Status: DC
Start: 2013-05-13 — End: 2013-05-13

## 2013-05-13 MED ORDER — ATORVASTATIN CALCIUM 10 MG PO TABS
10.0000 mg | ORAL_TABLET | Freq: Every day | ORAL | Status: DC
Start: 2013-05-13 — End: 2013-05-23

## 2013-05-23 ENCOUNTER — Telehealth: Payer: Self-pay | Admitting: *Deleted

## 2013-05-23 DIAGNOSIS — I2510 Atherosclerotic heart disease of native coronary artery without angina pectoris: Secondary | ICD-10-CM

## 2013-05-23 DIAGNOSIS — I4891 Unspecified atrial fibrillation: Secondary | ICD-10-CM

## 2013-05-23 MED ORDER — WARFARIN SODIUM 5 MG PO TABS: ORAL_TABLET | ORAL | Status: DC

## 2013-05-23 MED ORDER — HYDROCHLOROTHIAZIDE 12.5 MG PO CAPS: 12.5000 mg | ORAL_CAPSULE | ORAL | Status: DC

## 2013-05-23 MED ORDER — AMLODIPINE BESYLATE 5 MG PO TABS: 5.0000 mg | ORAL_TABLET | Freq: Every day | ORAL | Status: DC

## 2013-05-23 MED ORDER — ATORVASTATIN CALCIUM 10 MG PO TABS
10.0000 mg | ORAL_TABLET | Freq: Every day | ORAL | Status: DC
Start: 2013-05-23 — End: 2013-08-19

## 2013-05-23 MED ORDER — LISINOPRIL 10 MG PO TABS: 10.0000 mg | ORAL_TABLET | Freq: Every day | ORAL | Status: DC

## 2013-05-23 MED ORDER — METOPROLOL TARTRATE 25 MG PO TABS: 25.0000 mg | ORAL_TABLET | Freq: Two times a day (BID) | ORAL | Status: DC

## 2013-05-23 NOTE — Telephone Encounter (Signed)
Patient requests coumadin refill to be sent to [facility]. Thanks, MI

## 2013-05-30 ENCOUNTER — Ambulatory Visit (INDEPENDENT_AMBULATORY_CARE_PROVIDER_SITE_OTHER): Payer: Medicare HMO | Admitting: *Deleted

## 2013-05-30 DIAGNOSIS — I4891 Unspecified atrial fibrillation: Secondary | ICD-10-CM

## 2013-05-30 DIAGNOSIS — Z7901 Long term (current) use of anticoagulants: Secondary | ICD-10-CM

## 2013-05-30 DIAGNOSIS — Z5181 Encounter for therapeutic drug level monitoring: Secondary | ICD-10-CM | POA: Insufficient documentation

## 2013-05-30 LAB — POCT INR: INR: 1.8

## 2013-06-27 ENCOUNTER — Ambulatory Visit (INDEPENDENT_AMBULATORY_CARE_PROVIDER_SITE_OTHER): Payer: Medicare HMO | Admitting: Pharmacist

## 2013-06-27 DIAGNOSIS — I4891 Unspecified atrial fibrillation: Secondary | ICD-10-CM | POA: Diagnosis not present

## 2013-06-27 DIAGNOSIS — Z7901 Long term (current) use of anticoagulants: Secondary | ICD-10-CM

## 2013-06-27 DIAGNOSIS — Z5181 Encounter for therapeutic drug level monitoring: Secondary | ICD-10-CM

## 2013-06-27 LAB — POCT INR: INR: 2.2

## 2013-07-25 ENCOUNTER — Ambulatory Visit (INDEPENDENT_AMBULATORY_CARE_PROVIDER_SITE_OTHER): Payer: Commercial Managed Care - HMO | Admitting: *Deleted

## 2013-07-25 DIAGNOSIS — Z5181 Encounter for therapeutic drug level monitoring: Secondary | ICD-10-CM

## 2013-07-25 DIAGNOSIS — Z7901 Long term (current) use of anticoagulants: Secondary | ICD-10-CM

## 2013-07-25 DIAGNOSIS — I4891 Unspecified atrial fibrillation: Secondary | ICD-10-CM

## 2013-07-25 LAB — POCT INR: INR: 1.9

## 2013-08-14 ENCOUNTER — Other Ambulatory Visit (INDEPENDENT_AMBULATORY_CARE_PROVIDER_SITE_OTHER): Payer: Commercial Managed Care - HMO

## 2013-08-14 ENCOUNTER — Ambulatory Visit (INDEPENDENT_AMBULATORY_CARE_PROVIDER_SITE_OTHER): Payer: Commercial Managed Care - HMO | Admitting: *Deleted

## 2013-08-14 DIAGNOSIS — Z5181 Encounter for therapeutic drug level monitoring: Secondary | ICD-10-CM

## 2013-08-14 DIAGNOSIS — I119 Hypertensive heart disease without heart failure: Secondary | ICD-10-CM

## 2013-08-14 DIAGNOSIS — Z7901 Long term (current) use of anticoagulants: Secondary | ICD-10-CM

## 2013-08-14 DIAGNOSIS — I4891 Unspecified atrial fibrillation: Secondary | ICD-10-CM

## 2013-08-14 LAB — BASIC METABOLIC PANEL
BUN: 23 mg/dL (ref 6–23)
CHLORIDE: 104 meq/L (ref 96–112)
CO2: 26 mEq/L (ref 19–32)
Calcium: 9.1 mg/dL (ref 8.4–10.5)
Creatinine, Ser: 1.2 mg/dL (ref 0.4–1.5)
GFR: 62.04 mL/min (ref >60.00)
Glucose, Bld: 98 mg/dL (ref 70–99)
POTASSIUM: 4.1 meq/L (ref 3.5–5.1)
SODIUM: 136 meq/L (ref 135–145)

## 2013-08-14 LAB — HEPATIC FUNCTION PANEL
ALT: 17 U/L (ref 0–53)
AST: 18 U/L (ref 0–37)
Albumin: 3.4 g/dL — ABNORMAL LOW (ref 3.5–5.2)
Alkaline Phosphatase: 114 U/L (ref 39–117)
BILIRUBIN DIRECT: 0 mg/dL (ref 0.0–0.3)
TOTAL PROTEIN: 6.6 g/dL (ref 6.0–8.3)
Total Bilirubin: 1 mg/dL (ref 0.3–1.2)

## 2013-08-14 LAB — POCT INR: INR: 1.7

## 2013-08-14 LAB — LIPID PANEL
Cholesterol: 130 mg/dL (ref 0–200)
HDL: 35.7 mg/dL — AB (ref >39.00)
LDL CALC: 71 mg/dL (ref 0–99)
Total CHOL/HDL Ratio: 4
Triglycerides: 115 mg/dL (ref 0.0–149.0)
VLDL: 23 mg/dL (ref 0.0–40.0)

## 2013-08-14 NOTE — Progress Notes (Signed)
Quick Note:  Please make copy of labs for patient visit. ______ 

## 2013-08-19 ENCOUNTER — Encounter: Payer: Self-pay | Admitting: Cardiology

## 2013-08-19 ENCOUNTER — Encounter (INDEPENDENT_AMBULATORY_CARE_PROVIDER_SITE_OTHER): Payer: Self-pay

## 2013-08-19 ENCOUNTER — Ambulatory Visit (INDEPENDENT_AMBULATORY_CARE_PROVIDER_SITE_OTHER): Payer: Commercial Managed Care - HMO | Admitting: Cardiology

## 2013-08-19 VITALS — BP 118/86 | HR 73 | Ht 64.0 in | Wt 159.0 lb

## 2013-08-19 DIAGNOSIS — I259 Chronic ischemic heart disease, unspecified: Secondary | ICD-10-CM

## 2013-08-19 DIAGNOSIS — Z720 Tobacco use: Secondary | ICD-10-CM

## 2013-08-19 DIAGNOSIS — R079 Chest pain, unspecified: Secondary | ICD-10-CM

## 2013-08-19 DIAGNOSIS — F17200 Nicotine dependence, unspecified, uncomplicated: Secondary | ICD-10-CM

## 2013-08-19 DIAGNOSIS — I4891 Unspecified atrial fibrillation: Secondary | ICD-10-CM

## 2013-08-19 DIAGNOSIS — I119 Hypertensive heart disease without heart failure: Secondary | ICD-10-CM

## 2013-08-19 DIAGNOSIS — M545 Low back pain, unspecified: Secondary | ICD-10-CM

## 2013-08-19 MED ORDER — NITROGLYCERIN 0.4 MG SL SUBL: 0.4000 mg | SUBLINGUAL_TABLET | SUBLINGUAL | Status: DC | PRN

## 2013-08-19 MED ORDER — TRAMADOL HCL 50 MG PO TABS: 50.0000 mg | ORAL_TABLET | Freq: Two times a day (BID) | ORAL | Status: DC | PRN

## 2013-08-19 NOTE — Patient Instructions (Signed)
NEW RX FOR TRAMADOL 50 MG TWICE A DAY FOR BACK PAIN CALLED TO PHARMACY  Your physician wants you to follow-up in: 4 months with fasting labs (lp/bmet/hfp) You will receive a reminder letter in the mail two months in advance. If you don't receive a letter, please call our office to schedule the follow-up appointment.

## 2013-08-19 NOTE — Assessment & Plan Note (Signed)
Patient continues to smoke against advice.  He does have a slight smoker's cough.

## 2013-08-19 NOTE — Assessment & Plan Note (Signed)
The patient denies any recent chest pain or angina.  His nitroglycerin are about 78 years old and we called in a new bottle for him today

## 2013-08-19 NOTE — Progress Notes (Signed)
Christopher Butler Date of Birth:  Mar 09, 1925 [address] [place], [place]  [address] [phone]         Fax   [phone]  History of Present Illness: This pleasant 80 year old gentleman is seen for a scheduled followup office visit. He has a history of remote inferior wall myocardial infarction. He has a history of permanent atrial fibrillation and is on Coumadin. He had an echocardiogram in 2010 showing an ejection fraction of 40-45%. He was seen in our office on 08/24/11 at which time he was found to be in atrial fibrillation. His most recent echocardiogram 08/25/11 showed an ejection fraction of 35-40% with moderate tricuspid regurgitation. He is not aware of his arrhythmia. He has been on Coumadin since April 2013. The patient has not been experiencing any chest pain or any increasing shortness of breath.  The patient continues to have some problems with his back. He sees a Restaurant manager, fast food.. Previously he stopped his warfarin at at Lovenox bridging to allow for an intraspinal injection by Dr. Patrice Paradise.  He has had some symptoms of BPH with weak stream and frequency and has recently been on generic Flomax with equivocal results.   Current Outpatient Prescriptions  Medication Sig Dispense Refill  . acetaminophen (TYLENOL) 500 MG tablet Take 500 mg by mouth as directed. Taking 2 in the am      . amLODipine (NORVASC) 5 MG tablet Take 1 tablet (5 mg total) by mouth daily.  90 tablet  1  . aspirin 81 MG tablet Take 81 mg by mouth daily.      Marland Kitchen atorvastatin (LIPITOR) 10 MG tablet Take 10 mg by mouth daily.      . hydrochlorothiazide (MICROZIDE) 12.5 MG capsule Take 1 capsule (12.5 mg total) by mouth every other day.  45 capsule  1  . lisinopril (PRINIVIL,ZESTRIL) 10 MG tablet Take 1 tablet (10 mg total) by mouth daily.  90 tablet  1  . LORazepam (ATIVAN) 0.5 MG tablet TAKE 1 TABLET BY MOUTH DAILY AS NEEDED  30 tablet  5  . metoprolol tartrate (LOPRESSOR) 25 MG tablet Take 1 tablet (25  mg total) by mouth 2 (two) times daily.  180 tablet  1  . nitroGLYCERIN (NITROSTAT) 0.4 MG SL tablet Place 1 tablet (0.4 mg total) under the tongue every 5 (five) minutes as needed for chest pain.  25 tablet  PRN  . tolnaftate (TINACTIN) 1 % cream Apply 1 application topically as needed.      . warfarin (COUMADIN) 5 MG tablet Daily or as directed  90 tablet  1  . traMADol (ULTRAM) 50 MG tablet Take 1 tablet (50 mg total) by mouth 2 (two) times daily as needed.  60 tablet  0   No current facility-administered medications for this visit.    Allergies  Allergen Reactions  . Ceclor [Cefaclor]   . Erythromycin     Patient Active Problem List   Diagnosis Date Noted  . Ischemic heart disease 08/03/2010    Priority: High  . Tobacco abuse 08/03/2010    Priority: Medium  . Benign hypertensive heart disease without heart failure 08/03/2010    Priority: Medium  . Encounter for therapeutic drug monitoring 05/30/2013  . Tinnitus 12/20/2012  . Hyposmolality and/or hyponatremia 12/20/2012  . Tinea corporis 04/17/2012  . Atrial fibrillation 10/24/2011  . Hypercholesterolemia 08/03/2010  . Erectile dysfunction 08/03/2010  . Low back pain 08/03/2010  . Cholelithiasis 08/03/2010    History  Smoking status  . Current  Some Day Smoker -- 1.00 packs/day  Smokeless tobacco  . Not on file    History  Alcohol Use  . Yes    No family history on file.  Review of Systems: Constitutional: no fever chills diaphoresis or fatigue or change in weight.  Head and neck: no hearing loss, no epistaxis, no photophobia or visual disturbance. Respiratory: No cough, shortness of breath or wheezing. Cardiovascular: No chest pain peripheral edema, palpitations. Gastrointestinal: No abdominal distention, no abdominal pain, no change in bowel habits hematochezia or melena. Genitourinary: No dysuria, no frequency, no urgency, no nocturia. Musculoskeletal:No arthralgias, no back pain, no gait disturbance or  myalgias. Neurological: No dizziness, no headaches, no numbness, no seizures, no syncope, no weakness, no tremors. Hematologic: No lymphadenopathy, no easy bruising. Psychiatric: No confusion, no hallucinations, no sleep disturbance.    Physical Exam: Filed Vitals:   08/19/13 1049  BP: 118/86  Pulse:    the general appearance reveals a well-developed well-nourished elderly gentleman in no distress.The head and neck exam reveals pupils equal and reactive.  Extraocular movements are full.  There is no scleral icterus.  The mouth and pharynx are normal.  The neck is supple.  The carotids reveal no bruits.  The jugular venous pressure is normal.  The  thyroid is not enlarged.  There is no lymphadenopathy.  The chest is clear to percussion and auscultation.  There are no rales or rhonchi.  Expansion of the chest is symmetrical.  The pulse is slightly irregular in atrial fibrillation. The precordium is quiet.  The first heart sound is normal.  The second heart sound is physiologically split.  There is no gallop rub or click.  There is a soft systolic ejection murmur at the left sternal edge.  There is no abnormal lift or heave.  The abdomen is soft and nontender.  The bowel sounds are normal.  The liver and spleen are not enlarged.  There are no abdominal masses.  There are no abdominal bruits.  Extremities reveal good pedal pulses.  There is no phlebitis or edema.  There is no cyanosis or clubbing.  Strength is normal and symmetrical in all extremities.  There is no lateralizing weakness.  There are no sensory deficits.  The skin is warm and dry.  There is no rash.  EKG today shows atrial fibrillation with controlled ventricular response.  There are nonspecific ST-T wave changes.  No significant change since 04/18/13  Assessment / Plan: Continue same medication.  We will give him a trial of tramadol 50 mg twice a day for his chronic back pain..  Recheck in 4 months for office visit lipid panel hepatic  function panel and basal metabolic panel

## 2013-08-19 NOTE — Assessment & Plan Note (Signed)
The patient is in established atrial fibrillation.  He is on Coumadin.  Recently his INRs have been more fluctuating.  He has not had any TIA symptoms.

## 2013-08-19 NOTE — Assessment & Plan Note (Signed)
Blood pressure was remaining stable on current medication.  He is not having any dizziness or syncope or headaches.

## 2013-08-21 ENCOUNTER — Other Ambulatory Visit: Payer: Self-pay | Admitting: *Deleted

## 2013-08-21 DIAGNOSIS — F419 Anxiety disorder, unspecified: Secondary | ICD-10-CM

## 2013-08-21 MED ORDER — LORAZEPAM 0.5 MG PO TABS
ORAL_TABLET | ORAL | Status: DC
Start: 2013-08-21 — End: 2014-03-27

## 2013-08-28 ENCOUNTER — Ambulatory Visit (INDEPENDENT_AMBULATORY_CARE_PROVIDER_SITE_OTHER): Payer: Commercial Managed Care - HMO | Admitting: *Deleted

## 2013-08-28 DIAGNOSIS — I4891 Unspecified atrial fibrillation: Secondary | ICD-10-CM

## 2013-08-28 DIAGNOSIS — Z5181 Encounter for therapeutic drug level monitoring: Secondary | ICD-10-CM | POA: Diagnosis not present

## 2013-08-28 DIAGNOSIS — Z7901 Long term (current) use of anticoagulants: Secondary | ICD-10-CM

## 2013-08-28 LAB — POCT INR: INR: 2.9

## 2013-09-08 ENCOUNTER — Telehealth: Payer: Self-pay

## 2013-09-08 NOTE — Telephone Encounter (Signed)
Pt called states he completed zpack yesterday, called primary MD office [place] they rx 2nd course of abx Doxycycline 100mg  BID x 7 days.  Pt has appt for INR tomorrow at their office and has appt on 09/12/13 Friday at our office for INR.  Advised pt slight interaction with zpack and possible interaction with Doxy.  Needs INR closely monitored while taking.  Advised pt these meds can increase the effects of Coumadin making his blood thinner.  Advised to increase green leafy vegetable intake while on abxs.  Pt questions ETOH intake effects on Coumadin.  Advised pt this is contra indicated while on Coumadin and antibiotics.  ETOH can further thin the blood and increase risk of bleeding.  Advised pt to avoid ETOH intake while on abx. Pt will call back tomorrow with INR result from primary care office.

## 2013-09-09 ENCOUNTER — Encounter: Payer: Self-pay | Admitting: Pharmacist

## 2013-09-09 LAB — PROTIME-INR

## 2013-09-10 NOTE — Telephone Encounter (Signed)
Called spoke with pt, pt had INR checked POCT at primary MD office yesterday, INR 2.0.  Pt started on Doxycycline 100mg  BID x 7 days on 09/09/13.  Pt has appt in our Coumadin clinic for INR check on Friday 09/12/13.  Advised pt to continue on same dosage of Coumadin 5mg  qd except 2.5mg  Tu, Th, and Sa.  Keep scheduled f/u appt on 09/12/13.  Pt verbalized understanding.

## 2013-09-12 ENCOUNTER — Ambulatory Visit (INDEPENDENT_AMBULATORY_CARE_PROVIDER_SITE_OTHER): Payer: Commercial Managed Care - HMO | Admitting: *Deleted

## 2013-09-12 DIAGNOSIS — I4891 Unspecified atrial fibrillation: Secondary | ICD-10-CM

## 2013-09-12 DIAGNOSIS — Z5181 Encounter for therapeutic drug level monitoring: Secondary | ICD-10-CM

## 2013-09-12 DIAGNOSIS — Z7901 Long term (current) use of anticoagulants: Secondary | ICD-10-CM

## 2013-09-12 LAB — POCT INR: INR: 1.8

## 2013-09-19 ENCOUNTER — Encounter: Payer: Self-pay | Admitting: Cardiology

## 2013-09-19 LAB — PROTIME-INR

## 2013-09-24 ENCOUNTER — Ambulatory Visit (INDEPENDENT_AMBULATORY_CARE_PROVIDER_SITE_OTHER): Payer: Commercial Managed Care - HMO | Admitting: *Deleted

## 2013-09-24 DIAGNOSIS — Z7901 Long term (current) use of anticoagulants: Secondary | ICD-10-CM

## 2013-09-24 DIAGNOSIS — Z5181 Encounter for therapeutic drug level monitoring: Secondary | ICD-10-CM

## 2013-09-24 DIAGNOSIS — I4891 Unspecified atrial fibrillation: Secondary | ICD-10-CM

## 2013-09-24 LAB — POCT INR: INR: 2.4

## 2013-10-02 ENCOUNTER — Other Ambulatory Visit: Payer: Self-pay | Admitting: Cardiology

## 2013-10-02 DIAGNOSIS — R52 Pain, unspecified: Secondary | ICD-10-CM

## 2013-10-02 NOTE — Telephone Encounter (Signed)
Melinda please review this refill wit Dr Sheria Lang

## 2013-10-15 ENCOUNTER — Ambulatory Visit (INDEPENDENT_AMBULATORY_CARE_PROVIDER_SITE_OTHER): Payer: Commercial Managed Care - HMO | Admitting: *Deleted

## 2013-10-15 DIAGNOSIS — Z5181 Encounter for therapeutic drug level monitoring: Secondary | ICD-10-CM

## 2013-10-15 DIAGNOSIS — I4891 Unspecified atrial fibrillation: Secondary | ICD-10-CM

## 2013-10-15 DIAGNOSIS — Z7901 Long term (current) use of anticoagulants: Secondary | ICD-10-CM

## 2013-10-15 LAB — POCT INR: INR: 2

## 2013-10-29 ENCOUNTER — Other Ambulatory Visit: Payer: Self-pay | Admitting: Cardiology

## 2013-11-11 ENCOUNTER — Other Ambulatory Visit: Payer: Self-pay | Admitting: Cardiology

## 2013-11-12 ENCOUNTER — Ambulatory Visit (INDEPENDENT_AMBULATORY_CARE_PROVIDER_SITE_OTHER): Payer: Commercial Managed Care - HMO | Admitting: *Deleted

## 2013-11-12 DIAGNOSIS — Z7901 Long term (current) use of anticoagulants: Secondary | ICD-10-CM

## 2013-11-12 DIAGNOSIS — I4891 Unspecified atrial fibrillation: Secondary | ICD-10-CM

## 2013-11-12 DIAGNOSIS — Z5181 Encounter for therapeutic drug level monitoring: Secondary | ICD-10-CM | POA: Diagnosis not present

## 2013-11-12 LAB — POCT INR: INR: 1.8

## 2013-11-26 ENCOUNTER — Ambulatory Visit (INDEPENDENT_AMBULATORY_CARE_PROVIDER_SITE_OTHER): Payer: Commercial Managed Care - HMO | Admitting: Surgery

## 2013-11-26 DIAGNOSIS — Z5181 Encounter for therapeutic drug level monitoring: Secondary | ICD-10-CM

## 2013-11-26 DIAGNOSIS — Z7901 Long term (current) use of anticoagulants: Secondary | ICD-10-CM

## 2013-11-26 DIAGNOSIS — I4891 Unspecified atrial fibrillation: Secondary | ICD-10-CM

## 2013-11-26 LAB — POCT INR: INR: 3.4

## 2013-12-01 ENCOUNTER — Other Ambulatory Visit: Payer: Self-pay | Admitting: Cardiology

## 2013-12-01 DIAGNOSIS — R52 Pain, unspecified: Secondary | ICD-10-CM

## 2013-12-03 ENCOUNTER — Other Ambulatory Visit: Payer: Self-pay | Admitting: Cardiology

## 2013-12-11 ENCOUNTER — Ambulatory Visit (INDEPENDENT_AMBULATORY_CARE_PROVIDER_SITE_OTHER): Payer: Commercial Managed Care - HMO | Admitting: Pharmacist Clinician (PhC)/ Clinical Pharmacy Specialist

## 2013-12-11 DIAGNOSIS — Z5181 Encounter for therapeutic drug level monitoring: Secondary | ICD-10-CM

## 2013-12-11 DIAGNOSIS — Z7901 Long term (current) use of anticoagulants: Secondary | ICD-10-CM

## 2013-12-11 DIAGNOSIS — I4891 Unspecified atrial fibrillation: Secondary | ICD-10-CM

## 2013-12-11 LAB — POCT INR: INR: 2.1

## 2013-12-15 ENCOUNTER — Other Ambulatory Visit (INDEPENDENT_AMBULATORY_CARE_PROVIDER_SITE_OTHER): Payer: Commercial Managed Care - HMO

## 2013-12-15 DIAGNOSIS — I119 Hypertensive heart disease without heart failure: Secondary | ICD-10-CM

## 2013-12-15 LAB — LIPID PANEL
CHOL/HDL RATIO: 3
CHOLESTEROL: 148 mg/dL (ref 0–200)
HDL: 42.6 mg/dL (ref >39.00)
LDL Cholesterol: 77 mg/dL (ref 0–99)
NonHDL: 105.4
TRIGLYCERIDES: 142 mg/dL (ref 0.0–149.0)
VLDL: 28.4 mg/dL (ref 0.0–40.0)

## 2013-12-15 LAB — HEPATIC FUNCTION PANEL
ALT: 28 U/L (ref 0–53)
AST: 27 U/L (ref 0–37)
Albumin: 3.6 g/dL (ref 3.5–5.2)
Alkaline Phosphatase: 119 U/L — ABNORMAL HIGH (ref 39–117)
BILIRUBIN DIRECT: 0.1 mg/dL (ref 0.0–0.3)
BILIRUBIN TOTAL: 1.2 mg/dL (ref 0.2–1.2)
Total Protein: 6.7 g/dL (ref 6.0–8.3)

## 2013-12-15 LAB — BASIC METABOLIC PANEL
BUN: 23 mg/dL (ref 6–23)
CO2: 28 mEq/L (ref 19–32)
Calcium: 9.3 mg/dL (ref 8.4–10.5)
Chloride: 100 mEq/L (ref 96–112)
Creatinine, Ser: 1.6 mg/dL — ABNORMAL HIGH (ref 0.4–1.5)
GFR: 43.01 mL/min — ABNORMAL LOW (ref >60.00)
GLUCOSE: 96 mg/dL (ref 70–99)
POTASSIUM: 4 meq/L (ref 3.5–5.1)
Sodium: 135 mEq/L (ref 135–145)

## 2013-12-16 NOTE — Progress Notes (Signed)
Quick Note:  Please make copy of labs for patient visit. ______ 

## 2013-12-17 ENCOUNTER — Ambulatory Visit (INDEPENDENT_AMBULATORY_CARE_PROVIDER_SITE_OTHER): Payer: Commercial Managed Care - HMO | Admitting: Cardiology

## 2013-12-17 ENCOUNTER — Encounter: Payer: Self-pay | Admitting: Cardiology

## 2013-12-17 VITALS — BP 118/80 | HR 86 | Ht 65.0 in | Wt 157.0 lb

## 2013-12-17 DIAGNOSIS — Z720 Tobacco use: Secondary | ICD-10-CM

## 2013-12-17 DIAGNOSIS — I119 Hypertensive heart disease without heart failure: Secondary | ICD-10-CM

## 2013-12-17 DIAGNOSIS — I259 Chronic ischemic heart disease, unspecified: Secondary | ICD-10-CM

## 2013-12-17 DIAGNOSIS — I4891 Unspecified atrial fibrillation: Secondary | ICD-10-CM | POA: Diagnosis not present

## 2013-12-17 DIAGNOSIS — I482 Chronic atrial fibrillation, unspecified: Secondary | ICD-10-CM

## 2013-12-17 DIAGNOSIS — E7800 Pure hypercholesterolemia, unspecified: Secondary | ICD-10-CM

## 2013-12-17 DIAGNOSIS — Z87898 Personal history of other specified conditions: Secondary | ICD-10-CM

## 2013-12-17 DIAGNOSIS — Z87438 Personal history of other diseases of male genital organs: Secondary | ICD-10-CM

## 2013-12-17 DIAGNOSIS — B029 Zoster without complications: Secondary | ICD-10-CM | POA: Diagnosis not present

## 2013-12-17 DIAGNOSIS — F17200 Nicotine dependence, unspecified, uncomplicated: Secondary | ICD-10-CM

## 2013-12-17 NOTE — Patient Instructions (Addendum)
Your physician recommends that you schedule a follow-up appointment in: [place]; AT THAT TIME YOU WILL NEED LAB WORK SAME DAY; BMET, PSA, AND FASTING LIPID AND LIVER PANEL  Your physician recommends that you continue on your current medications as directed. Please refer to the Current Medication list given to you today.

## 2013-12-17 NOTE — Assessment & Plan Note (Signed)
The patient has had 2 rounds of valacyclovir 1 g every 8 hours.  For his shingles he has seen Dr.Avva and also because of the left eye involvement he has seen Dr. Katy Fitch.  The patient had not taken the shingles shot previously.

## 2013-12-17 NOTE — Assessment & Plan Note (Signed)
The patient is in permanent atrial fibrillation.  He is really not aware of his heart rate.  He is on long-term Coumadin.  He has had no TIA or stroke symptoms.

## 2013-12-17 NOTE — Addendum Note (Signed)
Addended by: Michae Kava on: 12/17/2013 04:29 PM   Modules accepted: Orders

## 2013-12-17 NOTE — Assessment & Plan Note (Signed)
The patient has not had any recurrent chest pain or angina.  He has not had to take any sublingual nitroglycerin.

## 2013-12-17 NOTE — Progress Notes (Signed)
Christopher Butler Date of Birth:  1924/08/29 [facility] [address] [place], [place]  [address] [phone]        Fax   [phone]   History of Present Illness:  This pleasant 80 year old gentleman is seen for a scheduled followup office visit. He has a history of remote inferior wall myocardial infarction. He has a history of permanent atrial fibrillation and is on Coumadin. He had an echocardiogram in 2010 showing an ejection fraction of 40-45%. He was seen in our office on 08/24/11 at which time he was found to be in atrial fibrillation. His most recent echocardiogram 08/25/11 showed an ejection fraction of 35-40% with moderate tricuspid regurgitation. He is not aware of his arrhythmia. He has been on Coumadin since April 2013. The patient has not been experiencing any chest pain or any increasing shortness of breath. The patient continues to have some problems with his back. He sees a Restaurant manager, fast food.. Previously he stopped his warfarin at at Lovenox bridging to allow for an intraspinal injection by Dr. Patrice Paradise. He has had some symptoms of BPH with weak stream and frequency and has recently been on generic Flomax with equivocal results.  Since his last visit he has had herpes zoster affecting the left upper side of his face and also involving his left eye.  Current Outpatient Prescriptions  Medication Sig Dispense Refill  . amLODipine (NORVASC) 5 MG tablet TAKE 1 TABLET DAILY  90 tablet  0  . aspirin 81 MG tablet Take 81 mg by mouth daily.      Marland Kitchen atorvastatin (LIPITOR) 10 MG tablet TAKE 1 TABLET DAILY.  90 tablet  0  . hydrochlorothiazide (MICROZIDE) 12.5 MG capsule TAKE 1 CAPSULE EVERY OTHER DAY  45 capsule  0  . lisinopril (PRINIVIL,ZESTRIL) 10 MG tablet TAKE 1 TABLET  DAILY.  90 tablet  0  . LORazepam (ATIVAN) 0.5 MG tablet TAKE 1 TABLET BY MOUTH DAILY AS NEEDED  30 tablet  5  . metoprolol tartrate (LOPRESSOR) 25 MG tablet TAKE 1 TABLET TWICE DAILY  180 tablet  0  .  nitroGLYCERIN (NITROSTAT) 0.4 MG SL tablet Place 1 tablet (0.4 mg total) under the tongue every 5 (five) minutes as needed for chest pain.  25 tablet  PRN  . tamsulosin (FLOMAX) 0.4 MG CAPS capsule Take 0.4 mg by mouth at bedtime.      . traMADol (ULTRAM) 50 MG tablet TAKE 1 TABLET TWICE A DAY AS NEEDED  60 tablet  3  . valACYclovir (VALTREX) 500 MG tablet Take 500 mg by mouth 3 (three) times daily.      Marland Kitchen warfarin (COUMADIN) 5 MG tablet Daily or as directed  90 tablet  1   No current facility-administered medications for this visit.    Allergies  Allergen Reactions  . Ceclor [Cefaclor]   . Erythromycin     Patient Active Problem List   Diagnosis Date Noted  . Ischemic heart disease 08/03/2010    Priority: High  . Tobacco abuse 08/03/2010    Priority: Medium  . Benign hypertensive heart disease without heart failure 08/03/2010    Priority: Medium  . Herpes zoster 12/17/2013  . Encounter for therapeutic drug monitoring 05/30/2013  . Tinnitus 12/20/2012  . Hyposmolality and/or hyponatremia 12/20/2012  . Tinea corporis 04/17/2012  . Atrial fibrillation 10/24/2011  . Hypercholesterolemia 08/03/2010  . Erectile dysfunction 08/03/2010  . Low back pain 08/03/2010  . Cholelithiasis 08/03/2010    History  Smoking status  .  Current Some Day Smoker -- 1.00 packs/day  Smokeless tobacco  . Not on file    History  Alcohol Use  . Yes    No family history on file.  Review of Systems: Constitutional: no fever chills diaphoresis or fatigue or change in weight.  Head and neck: no hearing loss, no epistaxis, no photophobia or visual disturbance. Respiratory: No cough, shortness of breath or wheezing. Cardiovascular: No chest pain peripheral edema, palpitations. Gastrointestinal: No abdominal distention, no abdominal pain, no change in bowel habits hematochezia or melena. Genitourinary: No dysuria, no frequency, no urgency, no nocturia. Musculoskeletal:No arthralgias, no back pain,  no gait disturbance or myalgias. Neurological: No dizziness, no headaches, no numbness, no seizures, no syncope, no weakness, no tremors. Hematologic: No lymphadenopathy, no easy bruising. Psychiatric: No confusion, no hallucinations, no sleep disturbance.    Physical Exam: Filed Vitals:   12/17/13 0920  BP: 118/80  Pulse: 86   the general appearance reveals a well-developed well nourished white male in no distress.  He does have visible lesions of shingles in the left upper quadrant of his face and temporal area and involving his left eye.The head and neck exam reveals pupils equal and reactive.  Extraocular movements are full.  There is no scleral icterus.  The mouth and pharynx are normal.  The neck is supple.  The carotids reveal no bruits.  The jugular venous pressure is normal.  The  thyroid is not enlarged.  There is no lymphadenopathy.  The chest is clear to percussion and auscultation.  There are no rales or rhonchi.  Expansion of the chest is symmetrical.  The precordium is quiet.  The pulse is irregularly irregular. The first heart sound is normal.  The second heart sound is physiologically split.  There is no murmur gallop rub or click.  There is no abnormal lift or heave.  The abdomen is soft and nontender.  The bowel sounds are normal.  The liver and spleen are not enlarged.  There are no abdominal masses.  There are no abdominal bruits.  Extremities reveal weak pedal pulses..  There is no phlebitis or edema.  There is no cyanosis or clubbing.  Strength is normal and symmetrical in all extremities.  There is no lateralizing weakness.  There are no sensory deficits.  The skin is warm and dry.  There is no rash.     Assessment / Plan: 1.  Ischemic heart disease with remote inferior wall myocardial infarction. 2. permanent atrial fibrillation, on long-term Coumadin. 3. mild left ventricular systolic dysfunction with ejection fraction of 35-40% by echocardiogram 08/25/11 4. chronic low  back pain, on tramadol 5. history of BPH with obstruction and frequency 6. recent herpes zoster infection of left face and ocular region.  Disposition: Continue same medication.  Continue close followup of his shingles with Dr. Katy Fitch and Wandalee Ferdinand. Recheck here in 4 months for office visit EKG lipid panel hepatic function panel basal metabolic panel and PSA.

## 2013-12-17 NOTE — Assessment & Plan Note (Signed)
The patient quit smoking on his own 40 days ago.  He is encouraging his 2 daughters to quit as well.  He does have a mild loose cough which should improve with time.

## 2013-12-17 NOTE — Assessment & Plan Note (Signed)
Blood pressure was remaining stable on current therapy.  He is not having any symptoms of CHF.

## 2013-12-30 ENCOUNTER — Other Ambulatory Visit: Payer: Self-pay | Admitting: Cardiology

## 2014-01-08 ENCOUNTER — Ambulatory Visit (INDEPENDENT_AMBULATORY_CARE_PROVIDER_SITE_OTHER): Payer: Commercial Managed Care - HMO | Admitting: *Deleted

## 2014-01-08 DIAGNOSIS — Z5181 Encounter for therapeutic drug level monitoring: Secondary | ICD-10-CM

## 2014-01-08 DIAGNOSIS — I4891 Unspecified atrial fibrillation: Secondary | ICD-10-CM

## 2014-01-08 DIAGNOSIS — Z7901 Long term (current) use of anticoagulants: Secondary | ICD-10-CM | POA: Diagnosis not present

## 2014-01-08 LAB — POCT INR: INR: 2.4

## 2014-02-05 ENCOUNTER — Ambulatory Visit (INDEPENDENT_AMBULATORY_CARE_PROVIDER_SITE_OTHER): Payer: Commercial Managed Care - HMO | Admitting: *Deleted

## 2014-02-05 DIAGNOSIS — Z7901 Long term (current) use of anticoagulants: Secondary | ICD-10-CM

## 2014-02-05 DIAGNOSIS — Z5181 Encounter for therapeutic drug level monitoring: Secondary | ICD-10-CM

## 2014-02-05 DIAGNOSIS — I4891 Unspecified atrial fibrillation: Secondary | ICD-10-CM

## 2014-02-05 LAB — POCT INR: INR: 2.6

## 2014-03-12 ENCOUNTER — Telehealth: Payer: Self-pay | Admitting: Cardiology

## 2014-03-12 ENCOUNTER — Ambulatory Visit (INDEPENDENT_AMBULATORY_CARE_PROVIDER_SITE_OTHER): Payer: Commercial Managed Care - HMO | Admitting: Pharmacist

## 2014-03-12 DIAGNOSIS — Z7901 Long term (current) use of anticoagulants: Secondary | ICD-10-CM

## 2014-03-12 DIAGNOSIS — I4891 Unspecified atrial fibrillation: Secondary | ICD-10-CM

## 2014-03-12 DIAGNOSIS — Z5181 Encounter for therapeutic drug level monitoring: Secondary | ICD-10-CM

## 2014-03-12 LAB — POCT INR: INR: 3

## 2014-03-12 NOTE — Telephone Encounter (Signed)
Incoming records from [facility] received on 11.12.15// given to [place]

## 2014-03-21 ENCOUNTER — Other Ambulatory Visit: Payer: Self-pay | Admitting: Cardiology

## 2014-03-27 ENCOUNTER — Other Ambulatory Visit: Payer: Self-pay | Admitting: Cardiology

## 2014-03-30 ENCOUNTER — Other Ambulatory Visit: Payer: Self-pay | Admitting: Cardiology

## 2014-03-30 DIAGNOSIS — M791 Myalgia, unspecified site: Secondary | ICD-10-CM

## 2014-03-30 DIAGNOSIS — F419 Anxiety disorder, unspecified: Secondary | ICD-10-CM

## 2014-03-31 ENCOUNTER — Other Ambulatory Visit: Payer: Self-pay

## 2014-03-31 MED ORDER — METOPROLOL TARTRATE 25 MG PO TABS: 25.0000 mg | ORAL_TABLET | Freq: Two times a day (BID) | ORAL | Status: DC

## 2014-04-01 ENCOUNTER — Telehealth: Payer: Self-pay | Admitting: *Deleted

## 2014-04-01 NOTE — Telephone Encounter (Signed)
New Message  Pt wanted to receive flu shot the same day he is to do his labs on 12/14

## 2014-04-13 ENCOUNTER — Other Ambulatory Visit (INDEPENDENT_AMBULATORY_CARE_PROVIDER_SITE_OTHER): Payer: Commercial Managed Care - HMO | Admitting: *Deleted

## 2014-04-13 ENCOUNTER — Other Ambulatory Visit: Payer: Self-pay | Admitting: Cardiology

## 2014-04-13 DIAGNOSIS — E7800 Pure hypercholesterolemia, unspecified: Secondary | ICD-10-CM

## 2014-04-13 DIAGNOSIS — I119 Hypertensive heart disease without heart failure: Secondary | ICD-10-CM

## 2014-04-13 DIAGNOSIS — Z87438 Personal history of other diseases of male genital organs: Secondary | ICD-10-CM

## 2014-04-13 LAB — LIPID PANEL
CHOLESTEROL: 144 mg/dL (ref 0–200)
HDL: 42.6 mg/dL (ref >39.00)
LDL CALC: 81 mg/dL (ref 0–99)
NonHDL: 101.4
Total CHOL/HDL Ratio: 3
Triglycerides: 103 mg/dL (ref 0.0–149.0)
VLDL: 20.6 mg/dL (ref 0.0–40.0)

## 2014-04-13 LAB — BASIC METABOLIC PANEL
BUN: 20 mg/dL (ref 6–23)
CO2: 25 meq/L (ref 19–32)
CREATININE: 1.3 mg/dL (ref 0.4–1.5)
Calcium: 9.1 mg/dL (ref 8.4–10.5)
Chloride: 103 mEq/L (ref 96–112)
GFR: 53.96 mL/min — ABNORMAL LOW (ref >60.00)
Glucose, Bld: 99 mg/dL (ref 70–99)
Potassium: 4.1 mEq/L (ref 3.5–5.1)
Sodium: 134 mEq/L — ABNORMAL LOW (ref 135–145)

## 2014-04-13 LAB — HEPATIC FUNCTION PANEL
ALT: 22 U/L (ref 0–53)
AST: 20 U/L (ref 0–37)
Albumin: 3.7 g/dL (ref 3.5–5.2)
Alkaline Phosphatase: 113 U/L (ref 39–117)
BILIRUBIN DIRECT: 0.2 mg/dL (ref 0.0–0.3)
BILIRUBIN TOTAL: 1.3 mg/dL — AB (ref 0.2–1.2)
Total Protein: 6.1 g/dL (ref 6.0–8.3)

## 2014-04-14 LAB — PSA: PSA: 1.28 ng/mL (ref 0.10–4.00)

## 2014-04-14 NOTE — Progress Notes (Signed)
Quick Note:  Please make copy of labs for patient visit. ______ 

## 2014-04-15 ENCOUNTER — Ambulatory Visit (INDEPENDENT_AMBULATORY_CARE_PROVIDER_SITE_OTHER): Payer: Commercial Managed Care - HMO | Admitting: *Deleted

## 2014-04-15 ENCOUNTER — Ambulatory Visit (INDEPENDENT_AMBULATORY_CARE_PROVIDER_SITE_OTHER): Payer: Commercial Managed Care - HMO | Admitting: Pharmacist

## 2014-04-15 ENCOUNTER — Encounter: Payer: Self-pay | Admitting: Cardiology

## 2014-04-15 ENCOUNTER — Ambulatory Visit (INDEPENDENT_AMBULATORY_CARE_PROVIDER_SITE_OTHER): Payer: Commercial Managed Care - HMO | Admitting: Cardiology

## 2014-04-15 VITALS — BP 118/82 | HR 76 | Ht 65.0 in | Wt 162.0 lb

## 2014-04-15 DIAGNOSIS — B029 Zoster without complications: Secondary | ICD-10-CM

## 2014-04-15 DIAGNOSIS — I4891 Unspecified atrial fibrillation: Secondary | ICD-10-CM

## 2014-04-15 DIAGNOSIS — M791 Myalgia, unspecified site: Secondary | ICD-10-CM

## 2014-04-15 DIAGNOSIS — I482 Chronic atrial fibrillation, unspecified: Secondary | ICD-10-CM

## 2014-04-15 DIAGNOSIS — E7800 Pure hypercholesterolemia, unspecified: Secondary | ICD-10-CM

## 2014-04-15 DIAGNOSIS — I119 Hypertensive heart disease without heart failure: Secondary | ICD-10-CM

## 2014-04-15 DIAGNOSIS — Z5181 Encounter for therapeutic drug level monitoring: Secondary | ICD-10-CM

## 2014-04-15 DIAGNOSIS — Z23 Encounter for immunization: Secondary | ICD-10-CM

## 2014-04-15 DIAGNOSIS — Z7901 Long term (current) use of anticoagulants: Secondary | ICD-10-CM

## 2014-04-15 DIAGNOSIS — I259 Chronic ischemic heart disease, unspecified: Secondary | ICD-10-CM

## 2014-04-15 LAB — POCT INR: INR: 3.1

## 2014-04-15 MED ORDER — TRAMADOL HCL 50 MG PO TABS: 50.0000 mg | ORAL_TABLET | Freq: Two times a day (BID) | ORAL | Status: DC | PRN

## 2014-04-15 NOTE — Assessment & Plan Note (Signed)
The patient is not having any dizzy spells or syncope

## 2014-04-15 NOTE — Patient Instructions (Addendum)
OK TO USE YOUR TRAMADOL TWICE A DAY AS NEEDED   Your physician wants you to follow-up in: 4 months with fasting labs (lp/bmet/hfp)  You will receive a reminder letter in the mail two months in advance. If you don't receive a letter, please call our office to schedule the follow-up appointment.

## 2014-04-15 NOTE — Assessment & Plan Note (Signed)
The patient has not been experiencing any recurrent chest pain

## 2014-04-15 NOTE — Progress Notes (Signed)
Christopher Butler Date of Birth:  May 18, 1924 [place] Butler [address] [place], [place]  [address] [phone]        Fax   [phone]   History of Present Illness:  This pleasant 80 year old gentleman is seen for a scheduled followup office visit. He has a history of remote inferior wall myocardial infarction. He has a history of permanent atrial fibrillation and is on Coumadin. He had an echocardiogram in 2010 showing an ejection fraction of 40-45%. He was seen in our office on 08/24/11 at which time he was found to be in atrial fibrillation. His most recent echocardiogram 08/25/11 showed an ejection fraction of 35-40% with moderate tricuspid regurgitation. He is not aware of his arrhythmia. He has been on Coumadin since April 2013. The patient has not been experiencing any chest pain or any increasing shortness of breath. The patient continues to have some problems with his back. He sees a Restaurant manager, fast food.. Previously he stopped his warfarin at at Lovenox bridging to allow for an intraspinal injection by Dr. Patrice Paradise. He has had some symptoms of BPH with weak stream and frequency and has recently been on generic Flomax with equivocal results.  He quit smoking 158 days ago  Current Outpatient Prescriptions  Medication Sig Dispense Refill  . amLODipine (NORVASC) 5 MG tablet TAKE 1 TABLET DAILY 90 tablet 0  . aspirin 81 MG tablet Take 81 mg by mouth daily.    Marland Kitchen atorvastatin (LIPITOR) 10 MG tablet TAKE 1 TABLET DAILY 90 tablet 0  . lisinopril (PRINIVIL,ZESTRIL) 10 MG tablet TAKE 1 TABLET EVERY DAY 90 tablet 0  . LORazepam (ATIVAN) 0.5 MG tablet TAKE 1 TABLET EVERY DAY AS NEEDED 30 tablet 5  . metoprolol tartrate (LOPRESSOR) 25 MG tablet Take 1 tablet (25 mg total) by mouth 2 (two) times daily. 180 tablet 2  . nitroGLYCERIN (NITROSTAT) 0.4 MG SL tablet Place 1 tablet (0.4 mg total) under the tongue every 5 (five) minutes as needed for chest pain. 25 tablet PRN  . tamsulosin  (FLOMAX) 0.4 MG CAPS capsule Take 0.4 mg by mouth at bedtime.    . traMADol (ULTRAM) 50 MG tablet TAKE 1 TABLET TWICE A DAY AS NEEDED 30 tablet 3  . warfarin (COUMADIN) 5 MG tablet TAKE 1 TABLET DAILY OR AS DIRECTED 90 tablet 1   No current facility-administered medications for this visit.    Allergies  Allergen Reactions  . Ceclor [Cefaclor]   . Erythromycin     Patient Active Problem List   Diagnosis Date Noted  . Ischemic heart disease 08/03/2010    Priority: High  . Tobacco abuse 08/03/2010    Priority: Medium  . Benign hypertensive heart disease without heart failure 08/03/2010    Priority: Medium  . Herpes zoster 12/17/2013  . Encounter for therapeutic drug monitoring 05/30/2013  . Tinnitus 12/20/2012  . Hyposmolality and/or hyponatremia 12/20/2012  . Tinea corporis 04/17/2012  . Atrial fibrillation 10/24/2011  . Hypercholesterolemia 08/03/2010  . Erectile dysfunction 08/03/2010  . Low back pain 08/03/2010  . Cholelithiasis 08/03/2010    History  Smoking status  . Current Some Day Smoker -- 1.00 packs/day  Smokeless tobacco  . Not on file    History  Alcohol Use  . Yes    No family history on file.  Review of Systems: Constitutional: no fever chills diaphoresis or fatigue or change in weight.  Head and neck: no hearing loss, no epistaxis, no photophobia or visual disturbance. Respiratory: No cough,  shortness of breath or wheezing. Cardiovascular: No chest pain peripheral edema, palpitations. Gastrointestinal: No abdominal distention, no abdominal pain, no change in bowel habits hematochezia or melena. Genitourinary: No dysuria, no frequency, no urgency, no nocturia. Musculoskeletal:No arthralgias, no back pain, no gait disturbance or myalgias. Neurological: No dizziness, no headaches, no numbness, no seizures, no syncope, no weakness, no tremors. Hematologic: No lymphadenopathy, no easy bruising. Psychiatric: No confusion, no hallucinations, no sleep  disturbance.    Physical Exam: Filed Vitals:   04/15/14 0948  BP: 118/82  Pulse: 76   the general appearance reveals a well-developed well nourished white male in no distress.  He does have visible lesions of shingles in the left upper quadrant of his face and temporal area and involving his left eye.The head and neck exam reveals pupils equal and reactive.  Extraocular movements are full.  There is no scleral icterus.  The mouth and pharynx are normal.  The neck is supple.  The carotids reveal no bruits.  The jugular venous pressure is normal.  The  thyroid is not enlarged.  There is no lymphadenopathy.  The chest is clear to percussion and auscultation.  There are no rales or rhonchi.  Expansion of the chest is symmetrical.  The precordium is quiet.  The pulse is irregularly irregular. The first heart sound is normal.  The second heart sound is physiologically split.  There is no murmur gallop rub or click.  There is no abnormal lift or heave.  The abdomen is soft and nontender.  The bowel sounds are normal.  The liver and spleen are not enlarged.  There are no abdominal masses.  There are no abdominal bruits.  Extremities reveal weak pedal pulses..  There is no phlebitis or edema.  There is no cyanosis or clubbing.  Strength is normal and symmetrical in all extremities.  There is no lateralizing weakness.  There are no sensory deficits.  The skin is warm and dry.  There is no rash.  EKG today shows atrial fibrillation with controlled ventricular response and is unchanged since 08/19/13   Assessment / Plan: 1.  Ischemic heart disease with remote inferior wall myocardial infarction. 2. permanent atrial fibrillation, on long-term Coumadin. 3. mild left ventricular systolic dysfunction with ejection fraction of 35-40% by echocardiogram 08/25/11 4. chronic low back pain, on tramadol 5. history of BPH with obstruction and frequency 6. recent herpes zoster infection of left face and ocular  region.  Disposition: Continue same medication.  Continue close followup of his shingles with Dr. Katy Fitch and Wandalee Ferdinand. Recheck here in 4 months for office visit EKG lipid panel hepatic function panel basal metabolic panel and PSA.

## 2014-04-15 NOTE — Assessment & Plan Note (Signed)
The patient has not had any TIA symptoms from his atrial fibrillation

## 2014-04-16 ENCOUNTER — Telehealth: Payer: Self-pay | Admitting: Cardiology

## 2014-04-16 MED ORDER — AZITHROMYCIN 250 MG PO TABS: ORAL_TABLET | ORAL | Status: DC

## 2014-04-16 NOTE — Telephone Encounter (Signed)
Okay to add a Z-Pak. 

## 2014-04-16 NOTE — Telephone Encounter (Signed)
Patient started yesterday when he got home with a sore throat Today he feels like chest congestion and has cough Able to get up some sputum with cough but not much Denies fever Patient stated he did not feel bad prior to flu vaccine  Patient concerned with it being in chest that he may need antibiotic  Will forward to  Dr. Mare Ferrari for review

## 2014-04-16 NOTE — Telephone Encounter (Signed)
Advised patient   Discussed Zpak and Warfarin with  Dr. Mare Ferrari INR yesterday 3.1 Will have patient only take 1/2 Warfarin the 5 days he is on the Zpak per  Dr. Mare Ferrari

## 2014-04-16 NOTE — Telephone Encounter (Signed)
New message      Pt got a flu shot yesterday, today he has a cold.  Please advise

## 2014-04-16 NOTE — Telephone Encounter (Signed)
Patient will also start Mucinex plain twice a day as needed

## 2014-04-22 ENCOUNTER — Ambulatory Visit (INDEPENDENT_AMBULATORY_CARE_PROVIDER_SITE_OTHER): Payer: Commercial Managed Care - HMO | Admitting: *Deleted

## 2014-04-22 DIAGNOSIS — I4891 Unspecified atrial fibrillation: Secondary | ICD-10-CM

## 2014-04-22 DIAGNOSIS — Z5181 Encounter for therapeutic drug level monitoring: Secondary | ICD-10-CM

## 2014-04-22 DIAGNOSIS — Z7901 Long term (current) use of anticoagulants: Secondary | ICD-10-CM

## 2014-04-22 LAB — POCT INR: INR: 1.3

## 2014-04-27 IMAGING — CR DG CHEST 2V
2 series · 2 of 2 positions shown · non-contrast
Comparison: Two-view chest 06/29/2009.

CLINICAL DATA: Fall.  Left-sided back pain.  Bruising.  Chest pain.

CHEST - 2 VIEW

[view not recorded (1 of 2)]
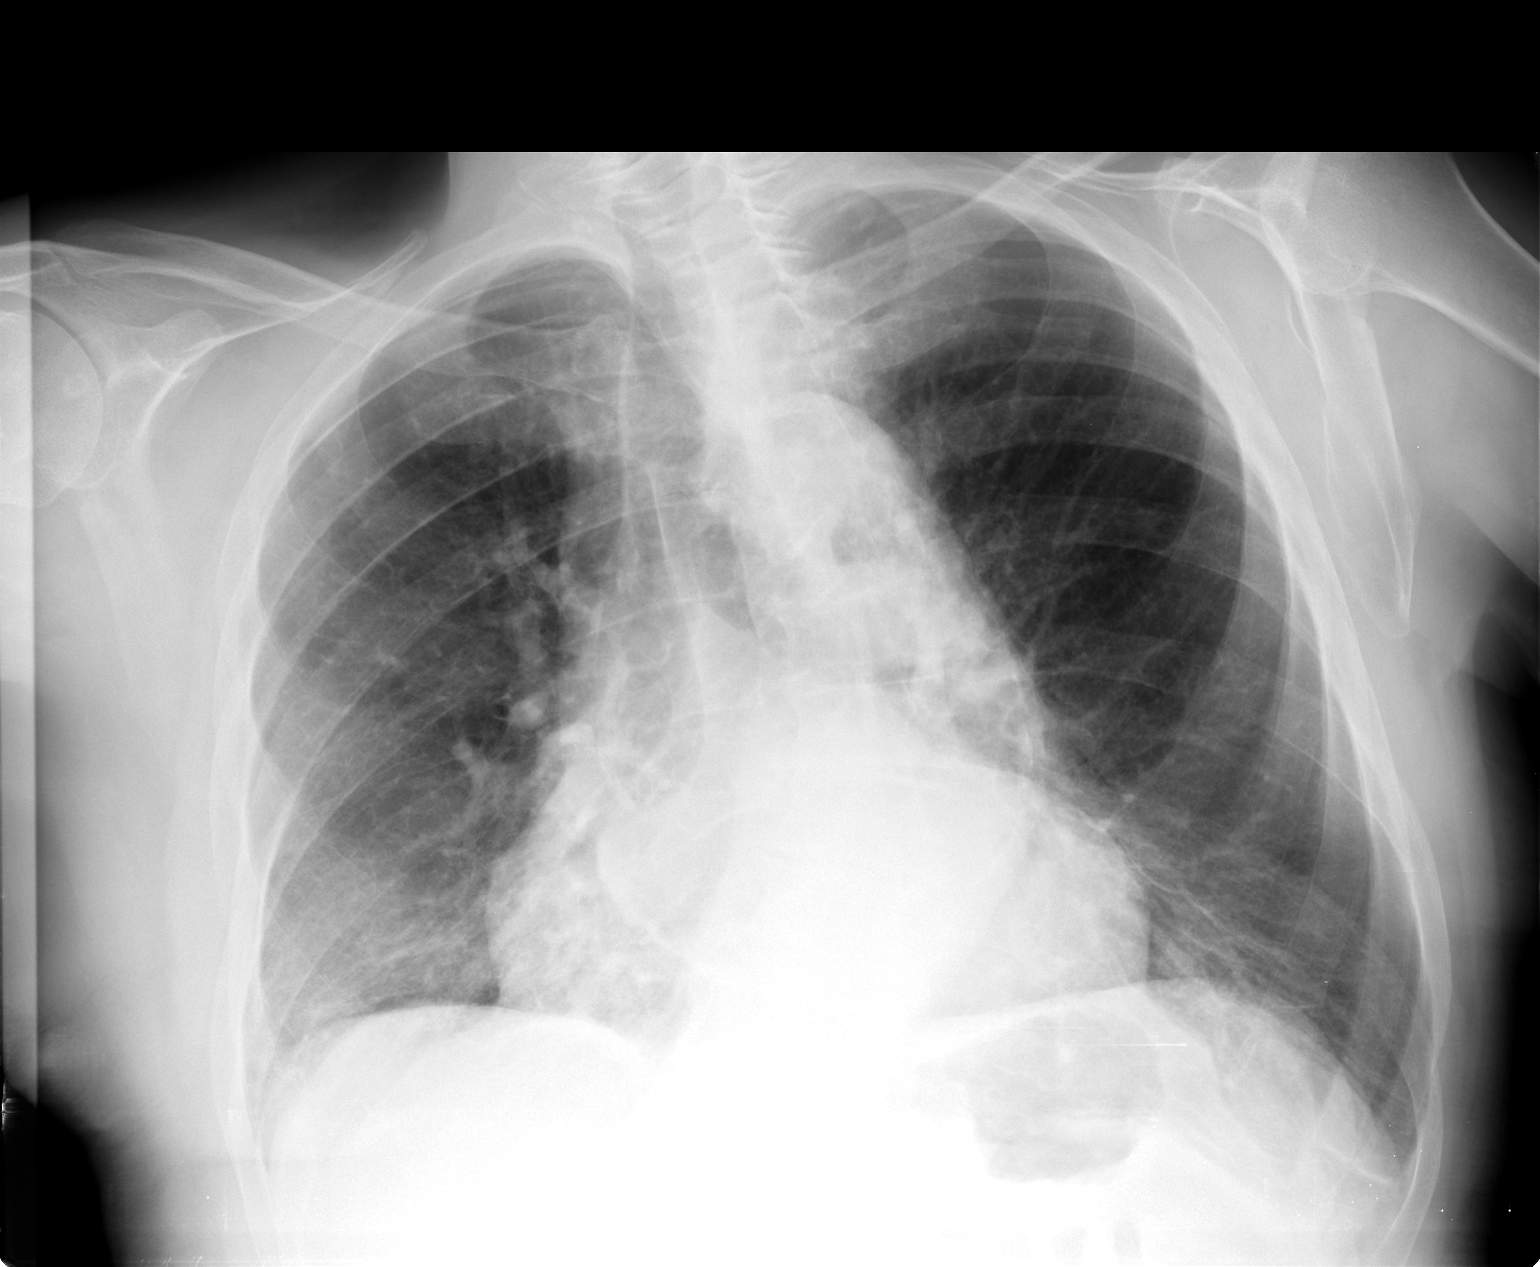

[view not recorded (2 of 2)]
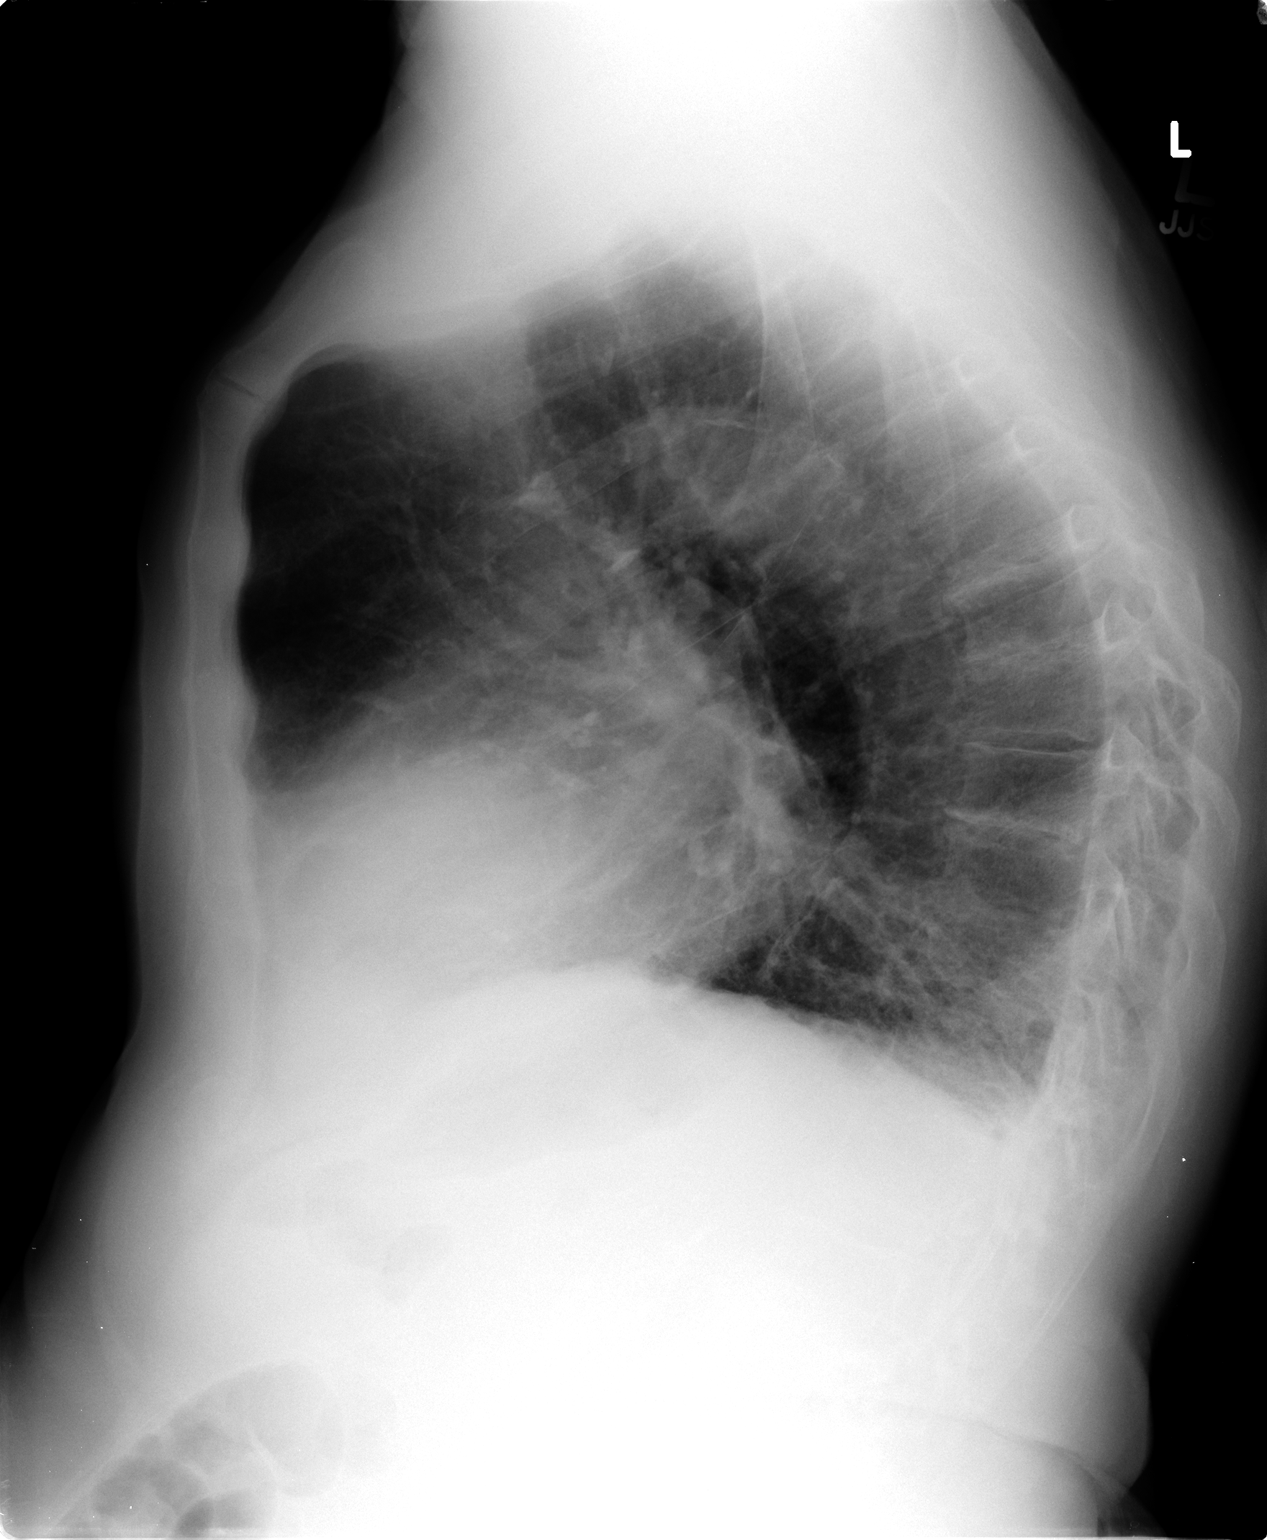

[2 of 2 positions shown; findings below may reference images not displayed]

FINDINGS: Cardiomegaly is stable.  Emphysematous changes are again
noted.  Bibasilar airspace disease is slightly more prominent,
likely representing a combination of atelectasis and scarring.  No
other consolidation is evident.  Atherosclerotic calcifications are
again noted in the aorta.  Degenerative changes are present in the
thoracic spine without significant interval change.  Moderate
osteopenia is evident.
IMPRESSION: 1.  Slight interval increase in bibasilar airspace disease likely
from reflecting a combination of atelectasis and scarring.
2.  Emphysema.
3.  Stable cardiomegaly without failure.
4.  Osteopenia without evidence for acute fracture.

## 2014-04-30 ENCOUNTER — Ambulatory Visit (INDEPENDENT_AMBULATORY_CARE_PROVIDER_SITE_OTHER): Payer: Commercial Managed Care - HMO | Admitting: *Deleted

## 2014-04-30 DIAGNOSIS — Z5181 Encounter for therapeutic drug level monitoring: Secondary | ICD-10-CM

## 2014-04-30 DIAGNOSIS — I4891 Unspecified atrial fibrillation: Secondary | ICD-10-CM

## 2014-04-30 DIAGNOSIS — Z7901 Long term (current) use of anticoagulants: Secondary | ICD-10-CM

## 2014-04-30 LAB — POCT INR: INR: 2.8

## 2014-05-12 ENCOUNTER — Other Ambulatory Visit: Payer: Self-pay | Admitting: Cardiology

## 2014-05-15 ENCOUNTER — Ambulatory Visit (INDEPENDENT_AMBULATORY_CARE_PROVIDER_SITE_OTHER): Payer: Commercial Managed Care - HMO

## 2014-05-15 DIAGNOSIS — Z5181 Encounter for therapeutic drug level monitoring: Secondary | ICD-10-CM

## 2014-05-15 DIAGNOSIS — Z7901 Long term (current) use of anticoagulants: Secondary | ICD-10-CM

## 2014-05-15 DIAGNOSIS — I4891 Unspecified atrial fibrillation: Secondary | ICD-10-CM

## 2014-05-15 LAB — POCT INR: INR: 2.7

## 2014-05-25 ENCOUNTER — Other Ambulatory Visit: Payer: Self-pay | Admitting: Cardiology

## 2014-06-12 ENCOUNTER — Ambulatory Visit (INDEPENDENT_AMBULATORY_CARE_PROVIDER_SITE_OTHER): Payer: Commercial Managed Care - HMO | Admitting: *Deleted

## 2014-06-12 DIAGNOSIS — Z7901 Long term (current) use of anticoagulants: Secondary | ICD-10-CM

## 2014-06-12 DIAGNOSIS — I4891 Unspecified atrial fibrillation: Secondary | ICD-10-CM

## 2014-06-12 DIAGNOSIS — Z5181 Encounter for therapeutic drug level monitoring: Secondary | ICD-10-CM

## 2014-06-12 LAB — POCT INR: INR: 3.8

## 2014-06-26 ENCOUNTER — Ambulatory Visit (INDEPENDENT_AMBULATORY_CARE_PROVIDER_SITE_OTHER): Payer: Commercial Managed Care - HMO | Admitting: *Deleted

## 2014-06-26 DIAGNOSIS — Z5181 Encounter for therapeutic drug level monitoring: Secondary | ICD-10-CM

## 2014-06-26 DIAGNOSIS — Z7901 Long term (current) use of anticoagulants: Secondary | ICD-10-CM

## 2014-06-26 DIAGNOSIS — I4891 Unspecified atrial fibrillation: Secondary | ICD-10-CM

## 2014-06-26 LAB — POCT INR: INR: 3.2

## 2014-07-06 ENCOUNTER — Other Ambulatory Visit: Payer: Self-pay | Admitting: Cardiology

## 2014-07-10 ENCOUNTER — Ambulatory Visit (INDEPENDENT_AMBULATORY_CARE_PROVIDER_SITE_OTHER): Payer: Commercial Managed Care - HMO

## 2014-07-10 DIAGNOSIS — Z7901 Long term (current) use of anticoagulants: Secondary | ICD-10-CM

## 2014-07-10 DIAGNOSIS — I4891 Unspecified atrial fibrillation: Secondary | ICD-10-CM

## 2014-07-10 DIAGNOSIS — Z5181 Encounter for therapeutic drug level monitoring: Secondary | ICD-10-CM

## 2014-07-10 LAB — POCT INR: INR: 1.9

## 2014-07-15 ENCOUNTER — Other Ambulatory Visit: Payer: Self-pay

## 2014-07-15 MED ORDER — AMLODIPINE BESYLATE 5 MG PO TABS: 5.0000 mg | ORAL_TABLET | Freq: Every day | ORAL | Status: DC

## 2014-07-24 ENCOUNTER — Other Ambulatory Visit: Payer: Self-pay | Admitting: Cardiology

## 2014-07-31 ENCOUNTER — Ambulatory Visit (INDEPENDENT_AMBULATORY_CARE_PROVIDER_SITE_OTHER): Payer: Commercial Managed Care - HMO | Admitting: *Deleted

## 2014-07-31 DIAGNOSIS — Z7901 Long term (current) use of anticoagulants: Secondary | ICD-10-CM | POA: Diagnosis not present

## 2014-07-31 DIAGNOSIS — Z5181 Encounter for therapeutic drug level monitoring: Secondary | ICD-10-CM

## 2014-07-31 DIAGNOSIS — I4891 Unspecified atrial fibrillation: Secondary | ICD-10-CM

## 2014-07-31 LAB — POCT INR: INR: 2.7

## 2014-08-11 ENCOUNTER — Other Ambulatory Visit (INDEPENDENT_AMBULATORY_CARE_PROVIDER_SITE_OTHER): Payer: Commercial Managed Care - HMO

## 2014-08-11 DIAGNOSIS — E7800 Pure hypercholesterolemia, unspecified: Secondary | ICD-10-CM

## 2014-08-11 DIAGNOSIS — I119 Hypertensive heart disease without heart failure: Secondary | ICD-10-CM | POA: Diagnosis not present

## 2014-08-11 LAB — BASIC METABOLIC PANEL
BUN: 20 mg/dL (ref 6–23)
CO2: 25 mEq/L (ref 19–32)
Calcium: 9.2 mg/dL (ref 8.4–10.5)
Chloride: 103 mEq/L (ref 96–112)
Creatinine, Ser: 1.25 mg/dL (ref 0.40–1.50)
GFR: 57.92 mL/min — AB (ref >60.00)
GLUCOSE: 93 mg/dL (ref 70–99)
POTASSIUM: 3.8 meq/L (ref 3.5–5.1)
Sodium: 133 mEq/L — ABNORMAL LOW (ref 135–145)

## 2014-08-11 LAB — HEPATIC FUNCTION PANEL
ALK PHOS: 122 U/L — AB (ref 39–117)
ALT: 16 U/L (ref 0–53)
AST: 20 U/L (ref 0–37)
Albumin: 3.6 g/dL (ref 3.5–5.2)
Bilirubin, Direct: 0.2 mg/dL (ref 0.0–0.3)
Total Bilirubin: 1.1 mg/dL (ref 0.2–1.2)
Total Protein: 6.5 g/dL (ref 6.0–8.3)

## 2014-08-11 LAB — LIPID PANEL
Cholesterol: 139 mg/dL (ref 0–200)
HDL: 41.5 mg/dL (ref >39.00)
LDL CALC: 73 mg/dL (ref 0–99)
NONHDL: 97.5
Total CHOL/HDL Ratio: 3
Triglycerides: 122 mg/dL (ref 0.0–149.0)
VLDL: 24.4 mg/dL (ref 0.0–40.0)

## 2014-08-11 NOTE — Progress Notes (Signed)
Quick Note:  Please make copy of labs for patient visit. ______ 

## 2014-08-14 ENCOUNTER — Encounter: Payer: Self-pay | Admitting: Cardiology

## 2014-08-14 ENCOUNTER — Ambulatory Visit (INDEPENDENT_AMBULATORY_CARE_PROVIDER_SITE_OTHER): Payer: Commercial Managed Care - HMO | Admitting: Cardiology

## 2014-08-14 VITALS — BP 130/78 | HR 62 | Ht 65.0 in | Wt 163.8 lb

## 2014-08-14 DIAGNOSIS — I259 Chronic ischemic heart disease, unspecified: Secondary | ICD-10-CM | POA: Diagnosis not present

## 2014-08-14 DIAGNOSIS — E7800 Pure hypercholesterolemia, unspecified: Secondary | ICD-10-CM

## 2014-08-14 DIAGNOSIS — I119 Hypertensive heart disease without heart failure: Secondary | ICD-10-CM

## 2014-08-14 DIAGNOSIS — I482 Chronic atrial fibrillation, unspecified: Secondary | ICD-10-CM

## 2014-08-14 NOTE — Progress Notes (Signed)
Cardiology Office Note   Date:  08/14/2014   ID:  Christopher Butler, DOB 1924/09/17, MRN [id number]  PCP:  [name], MD  Cardiologist:   Darlin Coco, MD   No chief complaint on file.     History of Present Illness: Christopher Butler is a 81 y.o. male who presents for a four-month follow-up office visit  This pleasant 81 year old gentleman is seen for a scheduled followup office visit. He has a history of remote inferior wall myocardial infarction. He has a history of permanent atrial fibrillation and is on Coumadin. He had an echocardiogram in 2010 showing an ejection fraction of 40-45%. He was seen in our office on 08/24/11 at which time he was found to be in atrial fibrillation. His most recent echocardiogram 08/25/11 showed an ejection fraction of 35-40% with moderate tricuspid regurgitation. He is not aware of his arrhythmia. He has been on Coumadin since April 2013. The patient has not been experiencing any chest pain or any increasing shortness of breath.  He carries nitroglycerin but has not had to use them.  The patient continues to have some problems with his back. He sees a Restaurant manager, fast food.. Previously he stopped his warfarin at at Lovenox bridging to allow for an intraspinal injection by Dr. Patrice Paradise. He has had some symptoms of BPH with weak stream and frequency and has recently been on generic Flomax with equivocal results. He quit smoking 280 days ago!   Past Medical History  Diagnosis Date  . Hyponatremia Sept 2010  . Emphysema   . Cholelithiases   . Disc disease, degenerative, thoracic or thoracolumbar   . CAD (coronary artery disease)   . COPD (chronic obstructive pulmonary disease)   . ED (erectile dysfunction)   . Acute myocardial infarction 1987    s/p TPA  . A-fib   . Hypercholesteremia   . HTN (hypertension)   . Sleep apnea     Past Surgical History  Procedure Laterality Date  . Cardiac catheterization    . Appendectomy    . Tonsillectomy        Current Outpatient Prescriptions  Medication Sig Dispense Refill  . amLODipine (NORVASC) 5 MG tablet Take 1 tablet (5 mg total) by mouth daily. 90 tablet 0  . aspirin 81 MG tablet Take 81 mg by mouth daily.    Marland Kitchen atorvastatin (LIPITOR) 10 MG tablet Take 10 mg by mouth daily.    Marland Kitchen levothyroxine (SYNTHROID, LEVOTHROID) 50 MCG tablet Take 50 mcg by mouth daily before breakfast.    . lisinopril (PRINIVIL,ZESTRIL) 10 MG tablet Take 10 mg by mouth daily.    Marland Kitchen LORazepam (ATIVAN) 0.5 MG tablet Take 0.5 mg by mouth daily as needed for sleep (for sleep).    . metoprolol tartrate (LOPRESSOR) 25 MG tablet Take 1 tablet (25 mg total) by mouth 2 (two) times daily. 180 tablet 2  . nitroGLYCERIN (NITROSTAT) 0.4 MG SL tablet Place 1 tablet (0.4 mg total) under the tongue every 5 (five) minutes as needed for chest pain. 25 tablet PRN  . tamsulosin (FLOMAX) 0.4 MG CAPS capsule Take 0.4 mg by mouth at bedtime.    . traMADol (ULTRAM) 50 MG tablet Take 50 mg by mouth every 12 (twelve) hours as needed (for back pain).    Marland Kitchen warfarin (COUMADIN) 5 MG tablet TAKE 1 TABLET DAILY OR AS DIRECTED 90 tablet 1   No current facility-administered medications for this visit.    Allergies:   Ceclor and Erythromycin    Social  History:  The patient  reports that he has been smoking.  He does not have any smokeless tobacco history on file. He reports that he drinks alcohol.   Family History:  The patient's family history includes Hypertension in his father.    ROS:  Please see the history of present illness.   Otherwise, review of systems are positive for none.   All other systems are reviewed and negative.    PHYSICAL EXAM: VS:  BP 130/78 mmHg  Pulse 62  Ht 5\' 5"  (1.651 m)  Wt 163 lb 12.8 oz (74.299 kg)  BMI 27.26 kg/m2 , BMI Body mass index is 27.26 kg/(m^2). GEN: Well nourished, well developed, in no acute distress HEENT: normal Neck: no JVD, carotid bruits, or masses Cardiac: RRR; no murmurs, rubs, or  gallops,no edema  Respiratory:  clear to auscultation bilaterally, normal work of breathing GI: soft, nontender, nondistended, + BS MS: no deformity or atrophy Skin: warm and dry, no rash Neuro:  Strength and sensation are intact Psych: euthymic mood, full affect   EKG:  EKG is not ordered today.   Recent Labs: 08/11/2014: ALT 16; BUN 20; Creatinine 1.25; Potassium 3.8; Sodium 133*    Lipid Panel    Component Value Date/Time   CHOL 139 08/11/2014 0826   TRIG 122.0 08/11/2014 0826   HDL 41.50 08/11/2014 0826   CHOLHDL 3 08/11/2014 0826   VLDL 24.4 08/11/2014 0826   LDLCALC 73 08/11/2014 0826      Wt Readings from Last 3 Encounters:  08/14/14 163 lb 12.8 oz (74.299 kg)  04/15/14 162 lb (73.483 kg)  12/17/13 157 lb (71.215 kg)         ASSESSMENT AND PLAN:  1. Ischemic heart disease with remote inferior wall myocardial infarction. 2. permanent atrial fibrillation, on long-term Coumadin. 3. mild left ventricular systolic dysfunction with ejection fraction of 35-40% by echocardiogram 08/25/11 4. chronic low back pain, on tramadol 5. history of BPH with obstruction and frequency 6. recent herpes zoster infection of left face and ocular region, improved, but with postherpetic neuralgia involving the area above the left ear. 7. Hypothyroid followed by Dr. Dagmar Hait 8.  Hypercholesterolemia with good response to Lipitor 10 mg daily  Disposition: Continue same medication.  Recheck here in 4 months for office visit  lipid panel hepatic function panel basal metabolic panel.   Current medicines are reviewed at length with the patient today.  The patient does not have concerns regarding medicines.  The following changes have been made:  no change  Labs/ tests ordered today include:   Orders Placed This Encounter  Procedures  . Lipid panel  . Hepatic function panel  . Basic metabolic panel     Recheck in 4 months for office visit lipid panel hepatic function panel and  basal metabolic panel   Signed, Darlin Coco, MD  08/14/2014 3:48 PM    [facility] [address], [place], [place]  [address] Phone: [phone]; Fax: [phone]

## 2014-08-14 NOTE — Patient Instructions (Signed)
Your physician recommends that you continue on your current medications as directed. Please refer to the Current Medication list given to you today.  Your physician wants you to follow-up in: 4 months with fasting labs (lp/bmet/hfp) You will receive a reminder letter in the mail two months in advance. If you don't receive a letter, please call our office to schedule the follow-up appointment.  

## 2014-08-17 ENCOUNTER — Other Ambulatory Visit: Payer: Self-pay | Admitting: Cardiology

## 2014-08-28 ENCOUNTER — Ambulatory Visit (INDEPENDENT_AMBULATORY_CARE_PROVIDER_SITE_OTHER): Payer: Commercial Managed Care - HMO | Admitting: *Deleted

## 2014-08-28 DIAGNOSIS — I4891 Unspecified atrial fibrillation: Secondary | ICD-10-CM

## 2014-08-28 DIAGNOSIS — Z7901 Long term (current) use of anticoagulants: Secondary | ICD-10-CM | POA: Diagnosis not present

## 2014-08-28 DIAGNOSIS — Z5181 Encounter for therapeutic drug level monitoring: Secondary | ICD-10-CM

## 2014-08-28 LAB — POCT INR: INR: 2.3

## 2014-09-02 ENCOUNTER — Other Ambulatory Visit: Payer: Self-pay | Admitting: Cardiology

## 2014-09-23 ENCOUNTER — Other Ambulatory Visit: Payer: Self-pay | Admitting: Cardiology

## 2014-09-24 NOTE — Telephone Encounter (Signed)
Per note 4.15.16

## 2014-09-30 ENCOUNTER — Other Ambulatory Visit: Payer: Self-pay | Admitting: Cardiology

## 2014-09-30 DIAGNOSIS — F419 Anxiety disorder, unspecified: Secondary | ICD-10-CM

## 2014-10-02 ENCOUNTER — Ambulatory Visit (INDEPENDENT_AMBULATORY_CARE_PROVIDER_SITE_OTHER): Payer: Commercial Managed Care - HMO | Admitting: *Deleted

## 2014-10-02 DIAGNOSIS — Z7901 Long term (current) use of anticoagulants: Secondary | ICD-10-CM

## 2014-10-02 DIAGNOSIS — I4891 Unspecified atrial fibrillation: Secondary | ICD-10-CM | POA: Diagnosis not present

## 2014-10-02 DIAGNOSIS — Z5181 Encounter for therapeutic drug level monitoring: Secondary | ICD-10-CM | POA: Diagnosis not present

## 2014-10-02 LAB — POCT INR: INR: 2.7

## 2014-10-27 ENCOUNTER — Telehealth: Payer: Self-pay | Admitting: Cardiology

## 2014-10-27 MED ORDER — AZITHROMYCIN 250 MG PO TABS: ORAL_TABLET | ORAL | Status: DC

## 2014-10-27 NOTE — Telephone Encounter (Signed)
Will forward to  Dr. Brackbill for review 

## 2014-10-27 NOTE — Telephone Encounter (Signed)
Patient on Warfarin Discussed with  Dr. Mare Ferrari and will have patient take Warfarin 2.5 mg daily while taking Zpak Advised patient and sent to pharmacy

## 2014-10-27 NOTE — Telephone Encounter (Signed)
Okay to add a Z-Pak.

## 2014-10-27 NOTE — Telephone Encounter (Signed)
New message      Pt has cold, slight bronchitis systems.  Would like a z/pac.  Please advise

## 2014-11-09 ENCOUNTER — Other Ambulatory Visit: Payer: Self-pay | Admitting: Cardiology

## 2014-11-13 ENCOUNTER — Ambulatory Visit (INDEPENDENT_AMBULATORY_CARE_PROVIDER_SITE_OTHER): Payer: Commercial Managed Care - HMO | Admitting: *Deleted

## 2014-11-13 DIAGNOSIS — Z5181 Encounter for therapeutic drug level monitoring: Secondary | ICD-10-CM

## 2014-11-13 DIAGNOSIS — I4891 Unspecified atrial fibrillation: Secondary | ICD-10-CM | POA: Diagnosis not present

## 2014-11-13 DIAGNOSIS — Z7901 Long term (current) use of anticoagulants: Secondary | ICD-10-CM | POA: Diagnosis not present

## 2014-11-13 LAB — POCT INR: INR: 1.8

## 2014-11-17 ENCOUNTER — Other Ambulatory Visit: Payer: Self-pay | Admitting: Cardiology

## 2014-11-24 ENCOUNTER — Other Ambulatory Visit: Payer: Self-pay | Admitting: Cardiology

## 2014-11-24 DIAGNOSIS — R52 Pain, unspecified: Secondary | ICD-10-CM

## 2014-12-18 ENCOUNTER — Ambulatory Visit (INDEPENDENT_AMBULATORY_CARE_PROVIDER_SITE_OTHER): Payer: Commercial Managed Care - HMO

## 2014-12-18 DIAGNOSIS — Z7901 Long term (current) use of anticoagulants: Secondary | ICD-10-CM | POA: Diagnosis not present

## 2014-12-18 DIAGNOSIS — I4891 Unspecified atrial fibrillation: Secondary | ICD-10-CM | POA: Diagnosis not present

## 2014-12-18 DIAGNOSIS — Z5181 Encounter for therapeutic drug level monitoring: Secondary | ICD-10-CM | POA: Diagnosis not present

## 2014-12-18 LAB — POCT INR: INR: 2.5

## 2014-12-22 ENCOUNTER — Other Ambulatory Visit: Payer: Self-pay | Admitting: Cardiology

## 2015-01-29 ENCOUNTER — Ambulatory Visit (INDEPENDENT_AMBULATORY_CARE_PROVIDER_SITE_OTHER): Payer: Commercial Managed Care - HMO

## 2015-01-29 DIAGNOSIS — I4891 Unspecified atrial fibrillation: Secondary | ICD-10-CM

## 2015-01-29 DIAGNOSIS — Z7901 Long term (current) use of anticoagulants: Secondary | ICD-10-CM | POA: Diagnosis not present

## 2015-01-29 DIAGNOSIS — Z5181 Encounter for therapeutic drug level monitoring: Secondary | ICD-10-CM

## 2015-01-29 LAB — POCT INR: INR: 2.5

## 2015-02-22 ENCOUNTER — Other Ambulatory Visit: Payer: Self-pay | Admitting: Cardiology

## 2015-02-23 ENCOUNTER — Other Ambulatory Visit: Payer: Self-pay

## 2015-02-23 MED ORDER — ATORVASTATIN CALCIUM 10 MG PO TABS: 10.0000 mg | ORAL_TABLET | Freq: Every day | ORAL | Status: DC

## 2015-03-09 ENCOUNTER — Other Ambulatory Visit: Payer: Self-pay | Admitting: Cardiology

## 2015-03-12 ENCOUNTER — Ambulatory Visit (INDEPENDENT_AMBULATORY_CARE_PROVIDER_SITE_OTHER): Payer: Commercial Managed Care - HMO | Admitting: *Deleted

## 2015-03-12 DIAGNOSIS — Z7901 Long term (current) use of anticoagulants: Secondary | ICD-10-CM

## 2015-03-12 DIAGNOSIS — Z5181 Encounter for therapeutic drug level monitoring: Secondary | ICD-10-CM

## 2015-03-12 DIAGNOSIS — I4891 Unspecified atrial fibrillation: Secondary | ICD-10-CM

## 2015-03-12 LAB — POCT INR: INR: 2.8

## 2015-03-24 ENCOUNTER — Telehealth: Payer: Self-pay | Admitting: Cardiology

## 2015-03-24 MED ORDER — ATORVASTATIN CALCIUM 10 MG PO TABS: ORAL_TABLET | ORAL | Status: DC

## 2015-03-24 NOTE — Telephone Encounter (Signed)
Pt's Rx was resent for 90 day supply after pt making an appointment to see Dr. Mare Ferrari in December. Pt was informed that he must keep upcoming appointment for future refills.

## 2015-03-24 NOTE — Telephone Encounter (Signed)
New Message   *STAT* If patient is at the pharmacy, call can be transferred to refill team.   1. Which medications need to be refilled? (please list name of each medication and dose if known) atorvastatin (LIPITOR) 10 MG tablet   2. Which pharmacy/location (including street and city if local pharmacy) is medication to be sent to? [facility] wouldn't refill the script because they said he needed an appt. Pt is not due until 07/2015.   3. Do they need a 30 day or 90 day supply? 3 months supply through your insurance company.   Comments: Pt states that he has 1 week left

## 2015-04-19 ENCOUNTER — Other Ambulatory Visit: Payer: Self-pay | Admitting: Cardiology

## 2015-04-20 ENCOUNTER — Ambulatory Visit (INDEPENDENT_AMBULATORY_CARE_PROVIDER_SITE_OTHER): Payer: Commercial Managed Care - HMO | Admitting: *Deleted

## 2015-04-20 DIAGNOSIS — Z7901 Long term (current) use of anticoagulants: Secondary | ICD-10-CM | POA: Diagnosis not present

## 2015-04-20 DIAGNOSIS — Z5181 Encounter for therapeutic drug level monitoring: Secondary | ICD-10-CM | POA: Diagnosis not present

## 2015-04-20 DIAGNOSIS — I4891 Unspecified atrial fibrillation: Secondary | ICD-10-CM

## 2015-04-20 LAB — POCT INR: INR: 3.1

## 2015-05-17 ENCOUNTER — Telehealth: Payer: Self-pay | Admitting: Cardiology

## 2015-05-17 NOTE — Telephone Encounter (Signed)
Spoke with patient and he congestion with no colored sputum Recommended using plain Mucinex, verbalized understanding

## 2015-05-17 NOTE — Telephone Encounter (Signed)
New message     Talk to Melinda----what can he take for a cold

## 2015-05-31 ENCOUNTER — Ambulatory Visit (INDEPENDENT_AMBULATORY_CARE_PROVIDER_SITE_OTHER): Payer: PPO | Admitting: Pharmacist

## 2015-05-31 ENCOUNTER — Encounter: Payer: Self-pay | Admitting: Cardiology

## 2015-05-31 ENCOUNTER — Ambulatory Visit (INDEPENDENT_AMBULATORY_CARE_PROVIDER_SITE_OTHER): Payer: PPO | Admitting: Cardiology

## 2015-05-31 VITALS — BP 120/84 | HR 60 | Ht 66.0 in | Wt 160.4 lb

## 2015-05-31 DIAGNOSIS — E7800 Pure hypercholesterolemia, unspecified: Secondary | ICD-10-CM | POA: Diagnosis not present

## 2015-05-31 DIAGNOSIS — I259 Chronic ischemic heart disease, unspecified: Secondary | ICD-10-CM

## 2015-05-31 DIAGNOSIS — I482 Chronic atrial fibrillation, unspecified: Secondary | ICD-10-CM

## 2015-05-31 DIAGNOSIS — Z7901 Long term (current) use of anticoagulants: Secondary | ICD-10-CM

## 2015-05-31 DIAGNOSIS — I4891 Unspecified atrial fibrillation: Secondary | ICD-10-CM | POA: Diagnosis not present

## 2015-05-31 DIAGNOSIS — Z5181 Encounter for therapeutic drug level monitoring: Secondary | ICD-10-CM

## 2015-05-31 LAB — POCT INR: INR: 3

## 2015-05-31 NOTE — Progress Notes (Signed)
Cardiology Office Note   Date:  05/31/2015   ID:  Christopher Butler, DOB [birth year], MRN [id number]  PCP:  [name], MD  Cardiologist: Darlin Coco MD  No chief complaint on file.     History of Present Illness: Christopher Butler is a 82 y.o. male who presents for scheduled follow-up visit  This pleasant 82 year old gentleman is seen for a scheduled followup office visit. He has a history of remote inferior wall myocardial infarction. He has a history of permanent atrial fibrillation and is on Coumadin. He had an echocardiogram in 2010 showing an ejection fraction of 40-45%. He was seen in our office on 08/24/11 at which time he was found to be in atrial fibrillation. His most recent echocardiogram 08/25/11 showed an ejection fraction of 35-40% with moderate tricuspid regurgitation. He is not aware of his arrhythmia. He has been on Coumadin since April 2013. The patient has not been experiencing any chest pain or any increasing shortness of breath. He carries nitroglycerin but has not had to use them. The patient continues to have some problems with his back. He sees a Restaurant manager, fast food.. Previously he stopped his warfarin at at Lovenox bridging to allow for an intraspinal injection by Dr. Patrice Paradise. He has had some symptoms of BPH with weak stream and frequency and has recently been on generic Flomax with equivocal results. When we last saw him he had quit smoking for more than 200 days.  However he started smoking again when he was on vacation at the beach and he continues to smoke.  He complains of symptoms of bronchitis and he is worried that he might get pneumonia.  I have encouraged him strongly to stop smoking again.  Past Medical History  Diagnosis Date  . Hyponatremia Sept 2010  . Emphysema   . Cholelithiases   . Disc disease, degenerative, thoracic or thoracolumbar   . CAD (coronary artery disease)   . COPD (chronic obstructive pulmonary disease) ([place])   . ED (erectile  dysfunction)   . Acute myocardial infarction 1987    s/p TPA  . A-fib ([place])   . Hypercholesteremia   . HTN (hypertension)   . Sleep apnea     Past Surgical History  Procedure Laterality Date  . Cardiac catheterization    . Appendectomy    . Tonsillectomy       Current Outpatient Prescriptions  Medication Sig Dispense Refill  . amLODipine (NORVASC) 5 MG tablet Take 5 mg by mouth daily.    Marland Kitchen aspirin 81 MG tablet Take 81 mg by mouth daily.    Marland Kitchen atorvastatin (LIPITOR) 10 MG tablet Take 10 mg by mouth daily.    Marland Kitchen levothyroxine (SYNTHROID, LEVOTHROID) 50 MCG tablet Take 50 mcg by mouth daily before breakfast.    . lisinopril (PRINIVIL,ZESTRIL) 10 MG tablet Take 5 mg by mouth daily.    Marland Kitchen LORazepam (ATIVAN) 0.5 MG tablet Take 0.5 mg by mouth daily as needed for anxiety.    . metoprolol tartrate (LOPRESSOR) 25 MG tablet Take 25 mg by mouth 2 (two) times daily.    . nitroGLYCERIN (NITROSTAT) 0.4 MG SL tablet Place 1 tablet (0.4 mg total) under the tongue every 5 (five) minutes as needed for chest pain. 25 tablet PRN  . tamsulosin (FLOMAX) 0.4 MG CAPS capsule Take 0.4 mg by mouth at bedtime.    . traMADol (ULTRAM) 50 MG tablet TAKE 1 TABLET BY MOUTH TWICE A DAY 60 tablet 3  . warfarin (COUMADIN) 5 MG tablet  TAKE 1 TABLET DAILY OR AS DIRECTED 90 tablet 1   No current facility-administered medications for this visit.    Allergies:   Ceclor and Erythromycin    Social History:  The patient  reports that he has been smoking.  He does not have any smokeless tobacco history on file. He reports that he drinks alcohol.   Family History:  The patient's Family history is unknown by patient.    ROS:  Please see the history of present illness.   Otherwise, review of systems are positive for none.   All other systems are reviewed and negative.    PHYSICAL EXAM: VS:  BP 120/84 mmHg  Pulse 60  Ht 5\' 6"  (1.676 m)  Wt 160 lb 6.4 oz (72.757 kg)  BMI 25.90 kg/m2 , BMI Body mass index is 25.9  kg/(m^2). GEN: Well nourished, well developed, in no acute distress HEENT: normal Neck: no JVD, carotid bruits, or masses Cardiac: RRR; no murmurs, rubs, or gallops,no edema  Respiratory:  clear to auscultation bilaterally, normal work of breathing GI: soft, nontender, nondistended, + BS MS: no deformity or atrophy Skin: warm and dry, no rash Neuro:  Strength and sensation are intact Psych: euthymic mood, full affect   EKG:  EKG is ordered today. The ekg ordered today demonstrates atrial fibrillation with controlled ventricular response and heart rate 82 bpm.  Old inferior wall myocardial infarction.   Recent Labs: 08/11/2014: ALT 16; BUN 20; Creatinine, Ser 1.25; Potassium 3.8; Sodium 133*    Lipid Panel    Component Value Date/Time   CHOL 139 08/11/2014 0826   TRIG 122.0 08/11/2014 0826   HDL 41.50 08/11/2014 0826   CHOLHDL 3 08/11/2014 0826   VLDL 24.4 08/11/2014 0826   LDLCALC 73 08/11/2014 0826      Wt Readings from Last 3 Encounters:  05/31/15 160 lb 6.4 oz (72.757 kg)  08/14/14 163 lb 12.8 oz (74.299 kg)  04/15/14 162 lb (73.483 kg)        ASSESSMENT AND PLAN:  1. Ischemic heart disease with remote inferior wall myocardial infarction. 2. permanent atrial fibrillation, on long-term Coumadin. 3. mild left ventricular systolic dysfunction with ejection fraction of 35-40% by echocardiogram 08/25/11 4. chronic low back pain, on tramadol 5. history of BPH with obstruction and frequency 6. recent herpes zoster infection of left face and ocular region, improved, but with postherpetic neuralgia involving the area above the left ear. 7. Hypothyroid followed by Dr. Dagmar Hait 8. Hypercholesterolemia with good response to Lipitor 10 mg daily   Current medicines are reviewed at length with the patient today.  The patient does not have concerns regarding medicines.  The following changes have been made:  no change  Labs/ tests ordered today include:   Orders Placed This  Encounter  Procedures  . EKG 12-Lead    Disposition: Continue current medication.  Stop smoking.  Recheck in 4 months for office visit with Dr. Acie Fredrickson.  Berna Spare MD 05/31/2015 6:03 PM    [facility] [address], [place], [place]  [address] Phone: [phone]; Fax: [phone]

## 2015-05-31 NOTE — Patient Instructions (Signed)
Medication Instructions:  Your physician recommends that you continue on your current medications as directed. Please refer to the Current Medication list given to you today.  Labwork: NONE  Testing/Procedures: NONE  Follow-Up: Your physician recommends that you schedule a follow-up appointment in:    If you need a refill on your cardiac medications before your next appointment, please call your pharmacy.

## 2015-06-04 ENCOUNTER — Other Ambulatory Visit: Payer: Self-pay | Admitting: Cardiology

## 2015-06-04 DIAGNOSIS — R52 Pain, unspecified: Secondary | ICD-10-CM

## 2015-06-04 MED ORDER — TRAMADOL HCL 50 MG PO TABS: 50.0000 mg | ORAL_TABLET | Freq: Two times a day (BID) | ORAL | Status: AC | PRN

## 2015-06-04 NOTE — Telephone Encounter (Signed)
Pharmacy requesting a refill on Tramadol 50 mg tablet. Please advise

## 2015-06-25 ENCOUNTER — Ambulatory Visit (INDEPENDENT_AMBULATORY_CARE_PROVIDER_SITE_OTHER): Payer: PPO | Admitting: *Deleted

## 2015-06-25 DIAGNOSIS — Z5181 Encounter for therapeutic drug level monitoring: Secondary | ICD-10-CM

## 2015-06-25 DIAGNOSIS — I4891 Unspecified atrial fibrillation: Secondary | ICD-10-CM

## 2015-06-25 DIAGNOSIS — Z7901 Long term (current) use of anticoagulants: Secondary | ICD-10-CM

## 2015-06-25 LAB — POCT INR: INR: 2.7

## 2015-07-01 ENCOUNTER — Other Ambulatory Visit: Payer: Self-pay

## 2015-07-01 ENCOUNTER — Telehealth: Payer: Self-pay | Admitting: Cardiovascular Disease

## 2015-07-01 MED ORDER — ATORVASTATIN CALCIUM 10 MG PO TABS: 10.0000 mg | ORAL_TABLET | Freq: Every day | ORAL | Status: DC

## 2015-07-01 MED ORDER — METOPROLOL TARTRATE 25 MG PO TABS: 25.0000 mg | ORAL_TABLET | Freq: Two times a day (BID) | ORAL | Status: DC

## 2015-07-01 NOTE — Telephone Encounter (Signed)
°*  STAT* If patient is at the pharmacy, call can be transferred to refill team.   1. Which medications need to be refilled? (please list name of each medication and dose if known) Atorvastatin 10mg  and Metoprolol Tartrate 25mg   2. Which pharmacy/location (including street and city if local pharmacy) is medication to be sent to? [facility]( fax number [phone]) phone [phone]  3. Do they need a 30 day or 90 day supply? [place]

## 2015-07-07 ENCOUNTER — Other Ambulatory Visit: Payer: Self-pay | Admitting: *Deleted

## 2015-07-07 ENCOUNTER — Telehealth: Payer: Self-pay | Admitting: Cardiovascular Disease

## 2015-07-07 MED ORDER — METOPROLOL TARTRATE 25 MG PO TABS: 25.0000 mg | ORAL_TABLET | Freq: Two times a day (BID) | ORAL | Status: DC

## 2015-07-07 MED ORDER — AMLODIPINE BESYLATE 5 MG PO TABS: 5.0000 mg | ORAL_TABLET | Freq: Every day | ORAL | Status: DC

## 2015-07-07 NOTE — Telephone Encounter (Signed)
°*  STAT* If patient is at the pharmacy, call can be transferred to refill team.   1. Which medications need to be refilled? (please list name of each medication and dose if known) Metoprolol 25 mg,Amlodipine 5 mg. Which pharmacy/location (including street and city if local pharmacy) is medication to be sent to?[facility]-Fax# [phone] and phone number is [phone]  3. Do they need a 30 day or 90 day supply?90 and refills

## 2015-07-10 ENCOUNTER — Other Ambulatory Visit: Payer: Self-pay | Admitting: Cardiovascular Disease

## 2015-08-06 ENCOUNTER — Ambulatory Visit (INDEPENDENT_AMBULATORY_CARE_PROVIDER_SITE_OTHER): Payer: PPO | Admitting: *Deleted

## 2015-08-06 DIAGNOSIS — Z7901 Long term (current) use of anticoagulants: Secondary | ICD-10-CM | POA: Diagnosis not present

## 2015-08-06 DIAGNOSIS — I4891 Unspecified atrial fibrillation: Secondary | ICD-10-CM

## 2015-08-06 DIAGNOSIS — Z5181 Encounter for therapeutic drug level monitoring: Secondary | ICD-10-CM | POA: Diagnosis not present

## 2015-08-06 LAB — POCT INR: INR: 2.4

## 2015-09-17 ENCOUNTER — Ambulatory Visit (INDEPENDENT_AMBULATORY_CARE_PROVIDER_SITE_OTHER): Payer: PPO | Admitting: *Deleted

## 2015-09-17 DIAGNOSIS — Z5181 Encounter for therapeutic drug level monitoring: Secondary | ICD-10-CM | POA: Diagnosis not present

## 2015-09-17 DIAGNOSIS — I4891 Unspecified atrial fibrillation: Secondary | ICD-10-CM | POA: Diagnosis not present

## 2015-09-17 DIAGNOSIS — Z7901 Long term (current) use of anticoagulants: Secondary | ICD-10-CM

## 2015-09-17 LAB — POCT INR: INR: 2.8

## 2015-09-28 ENCOUNTER — Ambulatory Visit (INDEPENDENT_AMBULATORY_CARE_PROVIDER_SITE_OTHER): Payer: PPO | Admitting: Cardiovascular Disease

## 2015-09-28 ENCOUNTER — Encounter: Payer: Self-pay | Admitting: Cardiovascular Disease

## 2015-09-28 VITALS — BP 120/80 | HR 74 | Ht 66.0 in | Wt 154.0 lb

## 2015-09-28 DIAGNOSIS — R0789 Other chest pain: Secondary | ICD-10-CM | POA: Diagnosis not present

## 2015-09-28 DIAGNOSIS — I259 Chronic ischemic heart disease, unspecified: Secondary | ICD-10-CM

## 2015-09-28 DIAGNOSIS — I482 Chronic atrial fibrillation, unspecified: Secondary | ICD-10-CM

## 2015-09-28 MED ORDER — NITROGLYCERIN 0.4 MG SL SUBL: 0.4000 mg | SUBLINGUAL_TABLET | SUBLINGUAL | Status: DC | PRN

## 2015-09-28 MED ORDER — WARFARIN SODIUM 5 MG PO TABS: 5.0000 mg | ORAL_TABLET | Freq: Once | ORAL | Status: DC

## 2015-09-28 NOTE — Patient Instructions (Addendum)
Medication Instructions:  Your physician recommends that you continue on your current medications as directed. Please refer to the Current Medication list given to you today. Refills of your Nitroglycerin and Warfarin have been sent to [facility]   Labwork: None Ordered   Testing/Procedures: None Ordered   Follow-Up: Your physician wants you to follow-up in: 6 months with Dr. Acie Fredrickson.  You will receive a reminder letter in the mail two months in advance. If you don't receive a letter, please call our office to schedule the follow-up appointment.   If you need a refill on your cardiac medications before your next appointment, please call your pharmacy.   Thank you for choosing [facility]! Christen Bame, RN [phone]

## 2015-09-28 NOTE — Progress Notes (Signed)
Cardiology Office Note   Date:  09/28/2015   ID:  Christopher Butler, DOB 1924-08-26, MRN [id number]  PCP:  [name], MD  Cardiologist:  Mertie Moores, MD   Chief Complaint  Patient presents with  . Atrial Fibrillation   Problem List 1.  Hx of Inf. MI  2. Chronic atrial fib 3. Mild chronic systolic CHF    History of Present Illness: Christopher Butler is a 82 y.o. male who presents for scheduled follow-up visit  This pleasant 82 year old gentleman is seen for a scheduled followup office visit. He has a history of remote inferior wall myocardial infarction. He has a history of permanent atrial fibrillation and is on Coumadin. He had an echocardiogram in 2010 showing an ejection fraction of 40-45%. He was seen in our office on 08/24/11 at which time he was found to be in atrial fibrillation. His most recent echocardiogram 08/25/11 showed an ejection fraction of 35-40% with moderate tricuspid regurgitation. He is not aware of his arrhythmia. He has been on Coumadin since April 2013. The patient has not been experiencing any chest pain or any increasing shortness of breath. He carries nitroglycerin but has not had to use them. The patient continues to have some problems with his back. He sees a Restaurant manager, fast food.. Previously he stopped his warfarin at at Lovenox bridging to allow for an intraspinal injection by Dr. Patrice Paradise. He has had some symptoms of BPH with weak stream and frequency and has recently been on generic Flomax with equivocal results. When we last saw him he had quit smoking for more than 200 days.  However he started smoking again when he was on vacation at the beach and he continues to smoke.  He complains of symptoms of bronchitis and he is worried that he might get pneumonia.  I have encouraged him strongly to stop smoking again.  Sep 28, 2015:    Doing well.   Seen for the first time by me today .  Transfer from Dr. Mare Ferrari.  No cardiac complaints  Still smoking ,   Has  quit several times.   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    Past Medical History  Diagnosis Date  . Hyponatremia Sept 2010  . Emphysema   . Cholelithiases   . Disc disease, degenerative, thoracic or thoracolumbar   . CAD (coronary artery disease)   . COPD (chronic obstructive pulmonary disease) ([place])   . ED (erectile dysfunction)   . Acute myocardial infarction 1987    s/p TPA  . A-fib ([place])   . Hypercholesteremia   . HTN (hypertension)   . Sleep apnea     Past Surgical History  Procedure Laterality Date  . Cardiac catheterization    . Appendectomy    . Tonsillectomy       Current Outpatient Prescriptions  Medication Sig Dispense Refill  . amLODipine (NORVASC) 5 MG tablet Take 1 tablet (5 mg total) by mouth daily. 90 tablet 2  . aspirin 81 MG tablet Take 81 mg by mouth daily.    Marland Kitchen atorvastatin (LIPITOR) 10 MG tablet Take 1 tablet by mouth once daily 90 tablet 1  . levothyroxine (SYNTHROID, LEVOTHROID) 50 MCG tablet Take 50 mcg by mouth daily before breakfast.    . lisinopril (PRINIVIL,ZESTRIL) 10 MG tablet Take 5 mg by mouth daily.    Marland Kitchen LORazepam (ATIVAN) 0.5 MG tablet Take 0.5 mg by mouth daily as needed  for anxiety.    . metoprolol tartrate (LOPRESSOR) 25 MG tablet Take 1 tablet (25 mg total) by mouth 2 (two) times daily. 180 tablet 2  . nitroGLYCERIN (NITROSTAT) 0.4 MG SL tablet Place 1 tablet (0.4 mg total) under the tongue every 5 (five) minutes as needed for chest pain. 25 tablet PRN  . tamsulosin (FLOMAX) 0.4 MG CAPS capsule Take 0.4 mg by mouth at bedtime.    . traMADol (ULTRAM) 50 MG tablet Take 1 tablet (50 mg total) by mouth 2 (two) times daily as needed (for pain.). ADDITIONAL REFILLS FROM PRIMARY CARE PHYSICIAN 60 tablet 1  . warfarin (COUMADIN) 5 MG tablet TAKE 1 TABLET DAILY OR AS DIRECTED 90 tablet 1   No current facility-administered medications for this visit.    Allergies:   Ceclor and  Erythromycin    Social History:  The patient  reports that he has been smoking.  He does not have any smokeless tobacco history on file. He reports that he drinks alcohol.   Family History:  The patient's Family history is unknown by patient.    ROS:  Please see the history of present illness.   Otherwise, review of systems are positive for none.   All other systems are reviewed and negative.    PHYSICAL EXAM: VS:  BP 120/80 mmHg  Pulse 74  Ht 5\' 6"  (1.676 m)  Wt 154 lb (69.854 kg)  BMI 24.87 kg/m2 , BMI Body mass index is 24.87 kg/(m^2). GEN: Well nourished, well developed, in no acute distress HEENT: normal Neck: no JVD, carotid bruits, or masses Cardiac: irreg. Irreg. ; no murmurs, rubs, or gallops,no edema  Respiratory: few rhonchi , normal work of breathing GI: soft, nontender, nondistended, + BS MS: no deformity or atrophy Skin: warm and dry, no rash Neuro:  Strength and sensation are intact Psych: euthymic mood, full affect   EKG:  EKG is ordered today. The ekg ordered today demonstrates atrial fibrillation with controlled ventricular response and heart rate 82 bpm.  Old inferior wall myocardial infarction.   Recent Labs: No results found for requested labs within last 365 days.    Lipid Panel    Component Value Date/Time   CHOL 139 08/11/2014 0826   TRIG 122.0 08/11/2014 0826   HDL 41.50 08/11/2014 0826   CHOLHDL 3 08/11/2014 0826   VLDL 24.4 08/11/2014 0826   LDLCALC 73 08/11/2014 0826      Wt Readings from Last 3 Encounters:  09/28/15 154 lb (69.854 kg)  05/31/15 160 lb 6.4 oz (72.757 kg)  08/14/14 163 lb 12.8 oz (74.299 kg)     ASSESSMENT AND PLAN:  1. Ischemic heart disease with remote inferior wall myocardial infarction. 2. Chronic  atrial fibrillation, on long-term Coumadin. 3. mild left ventricular systolic dysfunction with ejection fraction of 35-40% by echocardiogram 08/25/11 4. chronic low back pain, on tramadol 5. history of BPH with  obstruction and frequency  7. Hypothyroid followed by Dr. Dagmar Hait 8. Hypercholesterolemia - managed by primary care , on  Lipitor 10 mg daily   Current medicines are reviewed at length with the patient today.  The patient does not have concerns regarding medicines.  The following changes have been made:  no change  Labs/ tests ordered today include:   No orders of the defined types were placed in this encounter.      Mertie Moores, MD  09/28/2015 11:16 AM    [place],  [place], [place]  [id number] Pager [place] Phone: [phone]; Fax: [phone]

## 2015-10-26 ENCOUNTER — Other Ambulatory Visit: Payer: Self-pay | Admitting: Cardiovascular Disease

## 2015-10-29 ENCOUNTER — Ambulatory Visit (INDEPENDENT_AMBULATORY_CARE_PROVIDER_SITE_OTHER): Payer: PPO

## 2015-10-29 DIAGNOSIS — I4891 Unspecified atrial fibrillation: Secondary | ICD-10-CM | POA: Diagnosis not present

## 2015-10-29 DIAGNOSIS — Z5181 Encounter for therapeutic drug level monitoring: Secondary | ICD-10-CM | POA: Diagnosis not present

## 2015-10-29 DIAGNOSIS — Z7901 Long term (current) use of anticoagulants: Secondary | ICD-10-CM | POA: Diagnosis not present

## 2015-10-29 LAB — POCT INR: INR: 3

## 2015-11-01 ENCOUNTER — Other Ambulatory Visit: Payer: Self-pay | Admitting: *Deleted

## 2015-11-01 MED ORDER — WARFARIN SODIUM 5 MG PO TABS: ORAL_TABLET | ORAL | Status: DC

## 2015-11-01 NOTE — Addendum Note (Signed)
Addended by: Domenica Reamer R on: 11/01/2015 02:19 PM   Modules accepted: Orders

## 2015-11-01 NOTE — Telephone Encounter (Signed)
Patient wants med sent to [facility] in [place] a mail order pharmacy. He also wants a 90 day supply

## 2015-11-01 NOTE — Telephone Encounter (Signed)
Patient requested a ninety day rx. 

## 2015-12-10 ENCOUNTER — Ambulatory Visit (INDEPENDENT_AMBULATORY_CARE_PROVIDER_SITE_OTHER): Payer: PPO | Admitting: *Deleted

## 2015-12-10 DIAGNOSIS — Z5181 Encounter for therapeutic drug level monitoring: Secondary | ICD-10-CM

## 2015-12-10 DIAGNOSIS — I4891 Unspecified atrial fibrillation: Secondary | ICD-10-CM | POA: Diagnosis not present

## 2015-12-10 DIAGNOSIS — Z7901 Long term (current) use of anticoagulants: Secondary | ICD-10-CM | POA: Diagnosis not present

## 2015-12-10 LAB — POCT INR: INR: 3

## 2015-12-14 ENCOUNTER — Other Ambulatory Visit: Payer: Self-pay | Admitting: *Deleted

## 2015-12-14 MED ORDER — WARFARIN SODIUM 5 MG PO TABS: ORAL_TABLET | ORAL | 1 refills | Status: DC

## 2015-12-14 NOTE — Telephone Encounter (Signed)
Patient is requesting that his medication be sent to the mail order [facility], for a 90 day supply.

## 2016-01-21 ENCOUNTER — Ambulatory Visit (INDEPENDENT_AMBULATORY_CARE_PROVIDER_SITE_OTHER): Payer: PPO | Admitting: *Deleted

## 2016-01-21 DIAGNOSIS — Z7901 Long term (current) use of anticoagulants: Secondary | ICD-10-CM | POA: Diagnosis not present

## 2016-01-21 DIAGNOSIS — Z5181 Encounter for therapeutic drug level monitoring: Secondary | ICD-10-CM

## 2016-01-21 DIAGNOSIS — I4891 Unspecified atrial fibrillation: Secondary | ICD-10-CM

## 2016-01-21 LAB — POCT INR: INR: 2.9

## 2016-03-03 ENCOUNTER — Ambulatory Visit (INDEPENDENT_AMBULATORY_CARE_PROVIDER_SITE_OTHER): Payer: PPO | Admitting: *Deleted

## 2016-03-03 DIAGNOSIS — Z7901 Long term (current) use of anticoagulants: Secondary | ICD-10-CM

## 2016-03-03 DIAGNOSIS — I4891 Unspecified atrial fibrillation: Secondary | ICD-10-CM | POA: Diagnosis not present

## 2016-03-03 DIAGNOSIS — Z5181 Encounter for therapeutic drug level monitoring: Secondary | ICD-10-CM

## 2016-03-03 LAB — POCT INR: INR: 2.6

## 2016-03-27 ENCOUNTER — Encounter (INDEPENDENT_AMBULATORY_CARE_PROVIDER_SITE_OTHER): Payer: Self-pay

## 2016-03-27 ENCOUNTER — Encounter: Payer: Self-pay | Admitting: Cardiovascular Disease

## 2016-03-27 ENCOUNTER — Ambulatory Visit (INDEPENDENT_AMBULATORY_CARE_PROVIDER_SITE_OTHER): Payer: PPO | Admitting: Cardiovascular Disease

## 2016-03-27 VITALS — BP 120/88 | HR 68 | Ht 66.0 in | Wt 154.8 lb

## 2016-03-27 DIAGNOSIS — I2510 Atherosclerotic heart disease of native coronary artery without angina pectoris: Secondary | ICD-10-CM | POA: Diagnosis not present

## 2016-03-27 DIAGNOSIS — I482 Chronic atrial fibrillation, unspecified: Secondary | ICD-10-CM

## 2016-03-27 DIAGNOSIS — I5022 Chronic systolic (congestive) heart failure: Secondary | ICD-10-CM

## 2016-03-27 NOTE — Progress Notes (Signed)
Cardiology Office Note   Date:  03/27/2016   ID:  Christopher Butler, DOB 03-13-1925, MRN [id number]  PCP:  [name], MD  Cardiologist:  Mertie Moores, MD   Chief Complaint  Patient presents with  . Follow-up    atrial fib   Problem List 1.  Hx of Inf. MI  2. Chronic atrial fib 3. Mild chronic systolic CHF 4. Hyperlipidemia 5. Hypothyroidism 6. Hypertension    Christopher Butler is a 82 y.o. male who presents for scheduled follow-up visit  This pleasant 82 y.o. -year-old gentleman is seen for a scheduled followup office visit. He has a history of remote inferior wall myocardial infarction. He has a history of permanent atrial fibrillation and is on Coumadin. He had an echocardiogram in 2010 showing an ejection fraction of 40-45%. He was seen in our office on 08/24/11 at which time he was found to be in atrial fibrillation. His most recent echocardiogram 08/25/11 showed an ejection fraction of 35-40% with moderate tricuspid regurgitation. He is not aware of his arrhythmia. He has been on Coumadin since April 2013. The patient has not been experiencing any chest pain or any increasing shortness of breath. He carries nitroglycerin but has not had to use them. The patient continues to have some problems with his back. He sees a Restaurant manager, fast food.. Previously he stopped his warfarin at at Lovenox bridging to allow for an intraspinal injection by Dr. Patrice Paradise. He has had some symptoms of BPH with weak stream and frequency and has recently been on generic Flomax with equivocal results. When we last saw him he had quit smoking for more than 200 days.  However he started smoking again when he was on vacation at the beach and he continues to smoke.  He complains of symptoms of bronchitis and he is worried that he might get pneumonia.  I have encouraged him strongly to stop smoking again.  Sep 28, 2015:    Doing well.   Seen for the first time by me today .  Transfer from Dr. Mare Ferrari.  No  cardiac complaints  Still smoking ,   Has quit several times.   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   Nov. 27, 2017:     Christopher Butler is seen today for follow up visit Has chronic atrial fibrillation, hypertension, hyperlipidemia, hypothyroidism and chronic systolic congestive heart failure. Breathing is ok  Has had mild ankle swelling  Still smoking ,   Does not want to quit Has had some back pain   Past Medical History:  Diagnosis Date  . A-fib ([place])   . Acute myocardial infarction 1987   s/p TPA  . CAD (coronary artery disease)   . Cholelithiases   . COPD (chronic obstructive pulmonary disease) ([place])   . Disc disease, degenerative, thoracic or thoracolumbar   . ED (erectile dysfunction)   . Emphysema   . HTN (hypertension)   . Hypercholesteremia   . Hyponatremia Sept 2010  . Sleep apnea     Past Surgical History:  Procedure Laterality Date  . APPENDECTOMY    . CARDIAC CATHETERIZATION    . TONSILLECTOMY       Current Outpatient Prescriptions  Medication Sig Dispense Refill  . amLODipine (NORVASC) 5 MG tablet Take 1 tablet (5 mg total) by mouth daily. 90 tablet 2  . aspirin 81 MG tablet Take 81 mg by mouth daily.    Marland Kitchen atorvastatin (  LIPITOR) 10 MG tablet Take 1 tablet by mouth once daily 90 tablet 1  . levothyroxine (SYNTHROID, LEVOTHROID) 50 MCG tablet Take 50 mcg by mouth daily before breakfast.    . lisinopril (PRINIVIL,ZESTRIL) 10 MG tablet Take 5 mg by mouth daily.    Marland Kitchen LORazepam (ATIVAN) 0.5 MG tablet Take 0.5 mg by mouth daily as needed for anxiety.    . metoprolol tartrate (LOPRESSOR) 25 MG tablet Take 1 tablet (25 mg total) by mouth 2 (two) times daily. 180 tablet 2  . nitroGLYCERIN (NITROSTAT) 0.4 MG SL tablet Place 1 tablet (0.4 mg total) under the tongue every 5 (five) minutes as needed for chest pain. 25 tablet PRN  . tamsulosin (FLOMAX) 0.4 MG CAPS capsule Take 0.4 mg by mouth at bedtime.      . traMADol (ULTRAM) 50 MG tablet Take 1 tablet (50 mg total) by mouth 2 (two) times daily as needed (for pain.). ADDITIONAL REFILLS FROM PRIMARY CARE PHYSICIAN 60 tablet 1  . warfarin (COUMADIN) 5 MG tablet Take 1/2-1 tablet by mouth daily as directed by [facility]madin Clini 90 tablet 1   No current facility-administered medications for this visit.     Allergies:   Ceclor [cefaclor] and Erythromycin    Social History:  The patient  reports that he has been smoking.  He has been smoking about 1.00 pack per day. He does not have any smokeless tobacco history on file. He reports that he drinks alcohol.   Family History:  The patient's Family history is unknown by patient.    ROS:  Please see the history of present illness.   Otherwise, review of systems are positive for none.   All other systems are reviewed and negative.    PHYSICAL EXAM: VS:  BP 120/88 (BP Location: Left Arm, Patient Position: Sitting, Cuff Size: Normal)   Pulse 68   Ht 5\' 6"  (1.676 m)   Wt 154 lb 12.8 oz (70.2 kg)   BMI 24.99 kg/m  , BMI Body mass index is 24.99 kg/m. GEN: Well nourished, well developed, in no acute distress  HEENT: normal  Neck: no JVD, carotid bruits, or masses Cardiac: irreg. Irreg. ; no murmurs, rubs, or gallops,trace leg edema  Respiratory: few rhonchi , normal work of breathing GI: soft, nontender, nondistended, + BS MS: no deformity or atrophy  Skin: warm and dry, no rash Neuro:  Strength and sensation are intact Psych: euthymic mood, full affect   EKG:  EKG is ordered today. The ekg ordered today demonstrates atrial fibrillation with controlled ventricular response and heart rate 68. Occasional premature aberrantly conducted complexes.  NS IVCD.    Recent Labs: No results found for requested labs within last 8760 hours.    Lipid Panel    Component Value Date/Time   CHOL 139 08/11/2014 0826   TRIG 122.0 08/11/2014 0826   HDL 41.50 08/11/2014 0826   CHOLHDL 3 08/11/2014 0826    VLDL 24.4 08/11/2014 0826   LDLCALC 73 08/11/2014 0826      Wt Readings from Last 3 Encounters:  03/27/16 154 lb 12.8 oz (70.2 kg)  09/28/15 154 lb (69.9 kg)  05/31/15 160 lb 6.4 oz (72.8 kg)     ASSESSMENT AND PLAN:  1. Ischemic heart disease with remote inferior wall myocardial infarction. 2. Chronic  atrial fibrillation, on long-term Coumadin.  INR is 2.9 3. mild left ventricular systolic dysfunction with ejection fraction of 35-40% by echocardiogram 08/25/11 4. chronic low back pain, on tramadol 5. history of BPH  with obstruction and frequency  7. Hypothyroid followed by Dr. Dagmar Hait 8. Hypercholesterolemia - managed by primary care , on  Lipitor 10 mg daily   Current medicines are reviewed at length with the patient today.  The patient does not have concerns regarding medicines.  The following changes have been made:  no change  Labs/ tests ordered today include:   No orders of the defined types were placed in this encounter.     Mertie Moores, MD  03/27/2016 10:33 AM    [facility],  [place], [place]  [address] Pager [phone] Phone: [phone]; Fax: [phone]

## 2016-03-27 NOTE — Patient Instructions (Signed)

## 2016-04-07 ENCOUNTER — Other Ambulatory Visit: Payer: Self-pay | Admitting: *Deleted

## 2016-04-07 MED ORDER — ATORVASTATIN CALCIUM 10 MG PO TABS: 10.0000 mg | ORAL_TABLET | Freq: Every day | ORAL | 3 refills | Status: DC

## 2016-04-07 MED ORDER — AMLODIPINE BESYLATE 5 MG PO TABS: 5.0000 mg | ORAL_TABLET | Freq: Every day | ORAL | 3 refills | Status: DC

## 2016-04-07 MED ORDER — METOPROLOL TARTRATE 25 MG PO TABS: 25.0000 mg | ORAL_TABLET | Freq: Two times a day (BID) | ORAL | 3 refills | Status: DC

## 2016-04-14 ENCOUNTER — Ambulatory Visit (INDEPENDENT_AMBULATORY_CARE_PROVIDER_SITE_OTHER): Payer: PPO | Admitting: *Deleted

## 2016-04-14 DIAGNOSIS — Z5181 Encounter for therapeutic drug level monitoring: Secondary | ICD-10-CM

## 2016-04-14 DIAGNOSIS — Z7901 Long term (current) use of anticoagulants: Secondary | ICD-10-CM

## 2016-04-14 DIAGNOSIS — I4891 Unspecified atrial fibrillation: Secondary | ICD-10-CM | POA: Diagnosis not present

## 2016-04-14 LAB — POCT INR: INR: 3.1

## 2016-05-26 ENCOUNTER — Ambulatory Visit (INDEPENDENT_AMBULATORY_CARE_PROVIDER_SITE_OTHER): Payer: Medicare HMO

## 2016-05-26 DIAGNOSIS — Z5181 Encounter for therapeutic drug level monitoring: Secondary | ICD-10-CM | POA: Diagnosis not present

## 2016-05-26 DIAGNOSIS — I4891 Unspecified atrial fibrillation: Secondary | ICD-10-CM

## 2016-05-26 DIAGNOSIS — Z7901 Long term (current) use of anticoagulants: Secondary | ICD-10-CM

## 2016-05-26 LAB — POCT INR: INR: 3.6

## 2016-06-23 ENCOUNTER — Ambulatory Visit (INDEPENDENT_AMBULATORY_CARE_PROVIDER_SITE_OTHER): Payer: Medicare HMO | Admitting: *Deleted

## 2016-06-23 DIAGNOSIS — I4891 Unspecified atrial fibrillation: Secondary | ICD-10-CM

## 2016-06-23 DIAGNOSIS — Z7901 Long term (current) use of anticoagulants: Secondary | ICD-10-CM

## 2016-06-23 DIAGNOSIS — Z5181 Encounter for therapeutic drug level monitoring: Secondary | ICD-10-CM

## 2016-06-23 LAB — POCT INR: INR: 2

## 2016-07-10 ENCOUNTER — Telehealth: Payer: Self-pay | Admitting: Cardiovascular Disease

## 2016-07-10 MED ORDER — LISINOPRIL 10 MG PO TABS: 5.0000 mg | ORAL_TABLET | Freq: Every day | ORAL | 1 refills | Status: DC

## 2016-07-10 NOTE — Telephone Encounter (Signed)
°*  STAT* If patient is at the pharmacy, call can be transferred to refill team.   1. Which medications need to be refilled? (please list name of each medication and dose if known) Atorvastatin,Metoprolol and Amlodipine  2. Which pharmacy/location (including street and city if local pharmacy) is [phone]  3. Do they need a 30 day or 90 day supply? Atorvastin-90 and refill-Metoprolol-180 and refill-Amlodipiine 90 and refills

## 2016-07-10 NOTE — Telephone Encounter (Signed)
°*  STAT* If patient is at the pharmacy, call can be transferred to refill team.   1. Which medications need to be refilled? (please list name of each medication and dose if known) Atorvastatin,Metoprolol,and Amlodipine please call asap today 2. Which pharmacy/location (including street and city if local pharmacy) is [phone]  3. Do they need a 30 day or 90 day supply? Atorvastatin 90 and refills,Metoprolol 180 and refills,Amlodipine 90 and refills

## 2016-07-10 NOTE — Telephone Encounter (Signed)
°*  STAT* If patient is at the pharmacy, call can be transferred to refill team.   1. Which medications need to be refilled? (please list name of each medication and dose if known) Pt forgot he needs his Lisinopril  2. Which pharmacy/location (including street and city if local pharmacy) is medication to be [facility] to?[phone]  3. Do they need a 30 day or 90 day supply?90 and refills

## 2016-07-11 ENCOUNTER — Telehealth: Payer: Self-pay | Admitting: Cardiovascular Disease

## 2016-07-11 NOTE — Telephone Encounter (Signed)
Close this encounter

## 2016-07-11 NOTE — Telephone Encounter (Signed)
°*  STAT* If patient is at the pharmacy, call can be transferred to refill team.   1. Which medications need to be refilled? (please list name of each medication and dose if known) Atorvastatin,Metoprolol and Amlodipine-please call them in today if possible please  2. Which pharmacy/location (including street and city if local pharmacy) is medication to be sent to?[phone]  3. Do they need a 30 day or 90 day supply? Atorvastatin 90 and refills,Metoprolol 180 and refills,Amlodipine 90 and refills

## 2016-07-14 ENCOUNTER — Telehealth: Payer: Self-pay | Admitting: Cardiovascular Disease

## 2016-07-14 MED ORDER — LISINOPRIL 5 MG PO TABS: 5.0000 mg | ORAL_TABLET | Freq: Every day | ORAL | 3 refills | Status: DC

## 2016-07-14 MED ORDER — AMLODIPINE BESYLATE 5 MG PO TABS: 5.0000 mg | ORAL_TABLET | Freq: Every day | ORAL | 3 refills | Status: DC

## 2016-07-14 MED ORDER — ATORVASTATIN CALCIUM 10 MG PO TABS: 10.0000 mg | ORAL_TABLET | Freq: Every day | ORAL | 3 refills | Status: DC

## 2016-07-14 MED ORDER — METOPROLOL TARTRATE 25 MG PO TABS: 25.0000 mg | ORAL_TABLET | Freq: Two times a day (BID) | ORAL | 3 refills | Status: DC

## 2016-07-14 NOTE — Telephone Encounter (Signed)
New Message:   Pt says he needs clarification on how he is to take his Lisinopril.His last  Prescription directions were take 1/2 tablet,the prescription before that said to take 1 tablet. Which is correct?

## 2016-07-14 NOTE — Telephone Encounter (Addendum)
Spoke with patient regarding his question about recent request for Rx refills. He asked me the name of his new mail order pharmacy which he states is not any of the ones listed in his chart. I advised him that I do not have that information and asked him if he has any Rx from one of the pharmacies not listed in his chart; he denies. He requests refills of amlodipine, atorvastatin, and metoprolol be [facility] [place]. I advised him to call back with question or concerns. He thanked me for the call.

## 2016-07-14 NOTE — Telephone Encounter (Signed)
°*  STAT* If patient is at the pharmacy, call can be transferred to refill team.   1. Which medications need to be refilled? (please list name of each medication and dose if known)Atorvastatin,Metoprolol and Amlodipine-please call today if possible 2. Which pharmacy/location (including street and city if local pharmacy) is [phone]  3. Do they need a 30 day or 90 day supply? Atorvastatin- 90 and refills,Metoprolol 180 and refills and Amlodipine 90 and refillsi*

## 2016-07-17 NOTE — Telephone Encounter (Signed)
Called pt and spoke with pt's wife Lelon Frohlich informing her that the pt's medications are at his pharmacy and if he has any other problems, questions or concerns to call the office. Pt's wife verbalized understanding.

## 2016-07-21 ENCOUNTER — Ambulatory Visit (INDEPENDENT_AMBULATORY_CARE_PROVIDER_SITE_OTHER): Payer: Medicare HMO | Admitting: *Deleted

## 2016-07-21 DIAGNOSIS — Z5181 Encounter for therapeutic drug level monitoring: Secondary | ICD-10-CM | POA: Diagnosis not present

## 2016-07-21 DIAGNOSIS — Z7901 Long term (current) use of anticoagulants: Secondary | ICD-10-CM

## 2016-07-21 DIAGNOSIS — I4891 Unspecified atrial fibrillation: Secondary | ICD-10-CM

## 2016-07-21 LAB — POCT INR: INR: 3.5

## 2016-07-25 DIAGNOSIS — I10 Essential (primary) hypertension: Secondary | ICD-10-CM | POA: Diagnosis not present

## 2016-07-25 DIAGNOSIS — E784 Other hyperlipidemia: Secondary | ICD-10-CM | POA: Diagnosis not present

## 2016-07-25 DIAGNOSIS — E038 Other specified hypothyroidism: Secondary | ICD-10-CM | POA: Diagnosis not present

## 2016-07-25 DIAGNOSIS — Z125 Encounter for screening for malignant neoplasm of prostate: Secondary | ICD-10-CM | POA: Diagnosis not present

## 2016-08-01 DIAGNOSIS — I480 Paroxysmal atrial fibrillation: Secondary | ICD-10-CM | POA: Diagnosis not present

## 2016-08-01 DIAGNOSIS — E038 Other specified hypothyroidism: Secondary | ICD-10-CM | POA: Diagnosis not present

## 2016-08-01 DIAGNOSIS — J449 Chronic obstructive pulmonary disease, unspecified: Secondary | ICD-10-CM | POA: Diagnosis not present

## 2016-08-01 DIAGNOSIS — E784 Other hyperlipidemia: Secondary | ICD-10-CM | POA: Diagnosis not present

## 2016-08-01 DIAGNOSIS — R69 Illness, unspecified: Secondary | ICD-10-CM | POA: Diagnosis not present

## 2016-08-01 DIAGNOSIS — K219 Gastro-esophageal reflux disease without esophagitis: Secondary | ICD-10-CM | POA: Diagnosis not present

## 2016-08-01 DIAGNOSIS — I5022 Chronic systolic (congestive) heart failure: Secondary | ICD-10-CM | POA: Diagnosis not present

## 2016-08-01 DIAGNOSIS — I10 Essential (primary) hypertension: Secondary | ICD-10-CM | POA: Diagnosis not present

## 2016-08-01 DIAGNOSIS — Z0000 Encounter for general adult medical examination without abnormal findings: Secondary | ICD-10-CM | POA: Diagnosis not present

## 2016-08-01 DIAGNOSIS — I2510 Atherosclerotic heart disease of native coronary artery without angina pectoris: Secondary | ICD-10-CM | POA: Diagnosis not present

## 2016-08-11 ENCOUNTER — Ambulatory Visit (INDEPENDENT_AMBULATORY_CARE_PROVIDER_SITE_OTHER): Payer: Medicare HMO

## 2016-08-11 DIAGNOSIS — Z5181 Encounter for therapeutic drug level monitoring: Secondary | ICD-10-CM

## 2016-08-11 DIAGNOSIS — Z7901 Long term (current) use of anticoagulants: Secondary | ICD-10-CM

## 2016-08-11 DIAGNOSIS — I4891 Unspecified atrial fibrillation: Secondary | ICD-10-CM

## 2016-08-11 LAB — POCT INR: INR: 2.6

## 2016-09-04 ENCOUNTER — Other Ambulatory Visit: Payer: Self-pay | Admitting: Pharmacist

## 2016-09-04 MED ORDER — WARFARIN SODIUM 5 MG PO TABS: ORAL_TABLET | ORAL | 1 refills | Status: DC

## 2016-09-08 ENCOUNTER — Ambulatory Visit (INDEPENDENT_AMBULATORY_CARE_PROVIDER_SITE_OTHER): Payer: Medicare HMO

## 2016-09-08 ENCOUNTER — Encounter (INDEPENDENT_AMBULATORY_CARE_PROVIDER_SITE_OTHER): Payer: Self-pay

## 2016-09-08 DIAGNOSIS — Z5181 Encounter for therapeutic drug level monitoring: Secondary | ICD-10-CM

## 2016-09-08 DIAGNOSIS — Z7901 Long term (current) use of anticoagulants: Secondary | ICD-10-CM | POA: Diagnosis not present

## 2016-09-08 DIAGNOSIS — I4891 Unspecified atrial fibrillation: Secondary | ICD-10-CM

## 2016-09-08 LAB — POCT INR: INR: 2.2

## 2016-09-20 ENCOUNTER — Encounter: Payer: Self-pay | Admitting: Physician Assistant

## 2016-09-20 ENCOUNTER — Encounter (INDEPENDENT_AMBULATORY_CARE_PROVIDER_SITE_OTHER): Payer: Self-pay

## 2016-09-20 ENCOUNTER — Ambulatory Visit (INDEPENDENT_AMBULATORY_CARE_PROVIDER_SITE_OTHER): Payer: Medicare HMO | Admitting: Physician Assistant

## 2016-09-20 VITALS — BP 118/60 | HR 67 | Ht 64.0 in | Wt 152.0 lb

## 2016-09-20 DIAGNOSIS — I2510 Atherosclerotic heart disease of native coronary artery without angina pectoris: Secondary | ICD-10-CM

## 2016-09-20 DIAGNOSIS — I5022 Chronic systolic (congestive) heart failure: Secondary | ICD-10-CM | POA: Diagnosis not present

## 2016-09-20 DIAGNOSIS — Z720 Tobacco use: Secondary | ICD-10-CM | POA: Diagnosis not present

## 2016-09-20 DIAGNOSIS — I4821 Permanent atrial fibrillation: Secondary | ICD-10-CM

## 2016-09-20 DIAGNOSIS — I482 Chronic atrial fibrillation: Secondary | ICD-10-CM

## 2016-09-20 DIAGNOSIS — I110 Hypertensive heart disease with heart failure: Secondary | ICD-10-CM

## 2016-09-20 DIAGNOSIS — E7800 Pure hypercholesterolemia, unspecified: Secondary | ICD-10-CM

## 2016-09-20 NOTE — Progress Notes (Signed)
Cardiology Office Note:    Date:  09/20/2016   ID:  Christopher Butler, DOB [birth year], MRN [id number]  PCP:  Prince Solian, MD  Cardiologist:  Dr. Liam Rogers    Referring MD: Prince Solian, MD   Chief Complaint  Patient presents with  . Follow-up    CAD, CHF, atrial fibrillation    History of Present Illness:    Christopher Butler is a 83 y.o. male with a hx of CAD status post prior inferior wall myocardial infarction (status post streptokinase in 1987-did not require angioplasty), permanent atrial fibrillation, chronic Coumadin therapy, systolic CHF, HTN, HL, hypothyroidism. Echo in 4/13 demonstrated EF 35-40 with moderate TR. Last seen by Dr. Acie Fredrickson 03/2016.   Christopher Butler returns for follow-up. He is here alone. He denies chest discomfort or significant changes in his chronic dyspnea. He denies orthopnea or PND. He notes chronic LE edema, right greater than left. He denies syncope. He continues to smoke. He has a mild cough. He denies hemoptysis. He does have chronic back pain which limits his activity.  Prior CV studies:   The following studies were reviewed today:  Echo 07/2011 Moderate LVH, EF 35-40, inferior akinesis, severe LAE, mild RVE, mild RAE, moderate TR  Carotid US 10/2005 Bilateral ICA plaque without significant stenosis  Past Medical History:  Diagnosis Date  . A-fib ([place])   . Acute myocardial infarction 1987   s/p TPA  . CAD (coronary artery disease)   . Cholelithiases   . COPD (chronic obstructive pulmonary disease) ([place])   . Disc disease, degenerative, thoracic or thoracolumbar   . ED (erectile dysfunction)   . Emphysema   . HTN (hypertension)   . Hypercholesteremia   . Hyponatremia Sept 2010  . Sleep apnea     Past Surgical History:  Procedure Laterality Date  . APPENDECTOMY    . CARDIAC CATHETERIZATION    . TONSILLECTOMY      Current Medications: Current Meds  Medication Sig  . amLODipine (NORVASC) 5 MG tablet Take 1 tablet (5 mg total) by  mouth daily.  Marland Kitchen aspirin 81 MG tablet Take 81 mg by mouth daily.  Marland Kitchen atorvastatin (LIPITOR) 10 MG tablet Take 1 tablet (10 mg total) by mouth daily.  Marland Kitchen levothyroxine (SYNTHROID, LEVOTHROID) 50 MCG tablet Take 50 mcg by mouth daily before breakfast.  . lisinopril (PRINIVIL,ZESTRIL) 5 MG tablet Take 1 tablet (5 mg total) by mouth daily.  Marland Kitchen LORazepam (ATIVAN) 0.5 MG tablet Take 0.5 mg by mouth daily as needed for anxiety.  . metoprolol tartrate (LOPRESSOR) 25 MG tablet Take 1 tablet (25 mg total) by mouth 2 (two) times daily.  . nitroGLYCERIN (NITROSTAT) 0.4 MG SL tablet Place 1 tablet (0.4 mg total) under the tongue every 5 (five) minutes as needed for chest pain.  . tamsulosin (FLOMAX) 0.4 MG CAPS capsule Take 0.4 mg by mouth at bedtime.  . traMADol (ULTRAM) 50 MG tablet Take 1 tablet (50 mg total) by mouth 2 (two) times daily as needed (for pain.). ADDITIONAL REFILLS FROM PRIMARY CARE PHYSICIAN  . warfarin (COUMADIN) 5 MG tablet Take 1/2-1 tablet by mouth daily as directed by Coumadin Clini     Allergies:   Ceclor [cefaclor] and Erythromycin   Social History   Social History  . Marital status: Married    Spouse name: N/A  . Number of children: N/A  . Years of education: N/A   Social History Main Topics  . Smoking status: Current Some Day Smoker    Packs/day:  1.00  . Smokeless tobacco: Never Used  . Alcohol use Yes  . Drug use: Unknown  . Sexual activity: No   Other Topics Concern  . None   Social History Narrative  . None     Family Hx: The patient's Family history is unknown by patient.  ROS:   Please see the history of present illness.    Review of Systems  Cardiovascular: Positive for irregular heartbeat and leg swelling.  Respiratory: Positive for wheezing.   Hematologic/Lymphatic: Bruises/bleeds easily.  Musculoskeletal: Positive for back pain.  Gastrointestinal: Positive for constipation.  Neurological: Positive for loss of balance.   All other systems reviewed  and are negative.   EKGs/Labs/Other Test Reviewed:    EKG:  EKG is  ordered today.  The ekg ordered today demonstrates Atrial fibrillation with aberrancy, HR 67, leftward axis, QTC 431 ms, similar to prior tracings  Recent Labs: No results found for requested labs within last 8760 hours.  Labs from PCP:  LDL 75.000 mg 07/25/2016; Creatinine, Serum 1.200 mg/ 07/25/2016; TSH 1.620 micr 07/25/2016  Recent Lipid Panel    Component Value Date/Time   CHOL 139 08/11/2014 0826   TRIG 122.0 08/11/2014 0826   HDL 41.50 08/11/2014 0826   CHOLHDL 3 08/11/2014 0826   VLDL 24.4 08/11/2014 0826   LDLCALC 73 08/11/2014 0826    Physical Exam:    VS:  BP 118/60   Pulse 67   Ht 5\' 4"  (1.626 m)   Wt 152 lb (68.9 kg)   BMI 26.09 kg/m     Wt Readings from Last 3 Encounters:  09/20/16 152 lb (68.9 kg)  03/27/16 154 lb 12.8 oz (70.2 kg)  09/28/15 154 lb (69.9 kg)     Physical Exam  Constitutional: He is oriented to person, place, and time. He appears well-developed and well-nourished. No distress.  HENT:  Head: Normocephalic and atraumatic.  Eyes: No scleral icterus.  Neck: Normal range of motion. No JVD present.  Cardiovascular: Normal rate, S1 normal and S2 normal.  An irregularly irregular rhythm present.  No murmur heard. Pulmonary/Chest: Effort normal and breath sounds normal. He has no wheezes. He has no rhonchi. He has no rales.  Abdominal: Soft. There is no tenderness.  Musculoskeletal: He exhibits edema (1+ bilateral LE edema (R >>L)).  Neurological: He is alert and oriented to person, place, and time.  Skin: Skin is warm and dry.  Psychiatric: He has a normal mood and affect.    ASSESSMENT:    1. Coronary artery disease involving native coronary artery of native heart without angina pectoris   2. Permanent atrial fibrillation ([place])   3. Chronic systolic CHF (congestive heart failure) ([place])   4. Hypertensive heart disease with chronic systolic congestive heart failure ([place])     5. Hypercholesterolemia   6. Tobacco abuse    PLAN:    In order of problems listed above:  1. Coronary artery disease involving native coronary artery of native heart without angina pectoris -  Remote history of inferior myocardial infarction. He denies anginal symptoms. Continue beta blocker, statin, Coumadin.  2. Permanent atrial fibrillation ([facility]) - Rate controlled. He is tolerating Coumadin. Continue current therapy.  3. Chronic systolic CHF (congestive heart failure) ([facility]) - NYHA 2b. He does have some evidence of LE edema. However, his lung exam is normal and there is no JVD. I suspect his edema is multifactorial and related to amlodipine as well as congestive heart failure and venous insufficiency. We discussed multiple options including  the addition of low-dose Lasix versus decreasing his amlodipine versus trying bilateral lower extremity compression stockings. I prefer that he try the compression stockings first. If his edema or shortness of breath should worsen, we can consider adding Lasix. Continue beta blocker, ACE inhibitor.  3. Hypertensive heart disease with chronic systolic congestive heart failure ([place]) - The patient's blood pressure is controlled on his current regimen.  Continue current therapy.    4. Hypercholesterolemia - Managed by PCP.  LDL optimal on most recent lab work.  Continue current Rx.    5. Tobacco abuse - He is planning on quitting again soon.    Dispo:  Return in about 6 months (around 03/23/2017) for Routine Follow Up, w/ Dr. Acie Fredrickson or Richardson Dopp, PA-C .   Medication Adjustments/Labs and Tests Ordered: Current medicines are reviewed at length with the patient today.  Concerns regarding medicines are outlined above.  Orders/Tests:  Orders Placed This Encounter  Procedures  . EKG 12-Lead   Medication changes: No orders of the defined types were placed in this encounter.  Signed, Richardson Dopp, PA-C  09/20/2016 3:33 PM    [facility] [place], [place], [place]  [address] Phone: [phone]; Fax: [phone]

## 2016-09-20 NOTE — Patient Instructions (Signed)
Medication Instructions:  Your physician recommends that you continue on your current medications as directed. Please refer to the Current Medication list given to you today.   Labwork: NONE ORDERED  Testing/Procedures: NONE ORDERED  Follow-Up: Your physician wants you to follow-up in: [place] DR. Acie Fredrickson You will receive a reminder letter in the mail two months in advance. If you don't receive a letter, please call our office to schedule the follow-up appointment.   Any Other Special Instructions Will Be Listed Below (If Applicable). PICK UP SOME OTC COMPRESSION STOCKINGS; KNEE HIGH; TRY TO WEAR STOCKINGS FROM THE TIME YOU WAKE UP UNTIL THE TIME YOU GO TO BED.     If you need a refill on your cardiac medications before your next appointment, please call your pharmacy.

## 2016-10-09 DIAGNOSIS — C44719 Basal cell carcinoma of skin of left lower limb, including hip: Secondary | ICD-10-CM | POA: Diagnosis not present

## 2016-10-09 DIAGNOSIS — C44319 Basal cell carcinoma of skin of other parts of face: Secondary | ICD-10-CM | POA: Diagnosis not present

## 2016-10-09 DIAGNOSIS — L570 Actinic keratosis: Secondary | ICD-10-CM | POA: Diagnosis not present

## 2016-10-13 ENCOUNTER — Ambulatory Visit (INDEPENDENT_AMBULATORY_CARE_PROVIDER_SITE_OTHER): Payer: Medicare HMO | Admitting: Pharmacist

## 2016-10-13 DIAGNOSIS — Z5181 Encounter for therapeutic drug level monitoring: Secondary | ICD-10-CM

## 2016-10-13 DIAGNOSIS — Z7901 Long term (current) use of anticoagulants: Secondary | ICD-10-CM

## 2016-10-13 DIAGNOSIS — I4891 Unspecified atrial fibrillation: Secondary | ICD-10-CM | POA: Diagnosis not present

## 2016-10-13 LAB — POCT INR: INR: 2.7

## 2016-10-30 ENCOUNTER — Telehealth: Payer: Self-pay | Admitting: Cardiovascular Disease

## 2016-10-30 ENCOUNTER — Other Ambulatory Visit: Payer: Self-pay | Admitting: Cardiovascular Disease

## 2016-10-30 MED ORDER — LISINOPRIL 5 MG PO TABS: 5.0000 mg | ORAL_TABLET | Freq: Every day | ORAL | 3 refills | Status: DC

## 2016-10-30 NOTE — Telephone Encounter (Signed)
Spoke with patient who states he is confused about his lisinopril dose. He states he has a bottle of lisinopril 10 mg tablets and has been taking a whole tablet. He states he got a message from [facility] that he has a new Rx for 1/2 tab of lisinopril 10 mg. He states the pills are very small and he does not think he can split them. I reviewed his chart and at last ov with Richardson Dopp, PA he noted that patient took lisinopril 5 mg. He does not monitor his BP or HR at home. I advised there are 2 different lisinopril prescriptions - 1 for lisinopril 5 mg once daily and 1 for lisinopril 10 mg take 1/2 tab once daily. I asked how many tablets he currently has and he states he has about 10 of the 10 mg tabs currently. I advised him to put them aside and I will send a new Rx for lisinopril 5 mg tablets; he requests #90 day supply. I advised him that if he has BP checked to call back if it is consistently > 150/80 or if he has questions or concerns. He verbalized understanding and agreement and thanked me for the call.

## 2016-10-30 NOTE — Telephone Encounter (Signed)
New message     Pt would like to know how is is suppose to be taking this medication ?  lisinopril (PRINIVIL,ZESTRIL) 10 MG tablet TAKE 1/2 TABLET BY MOUTH EVERY DAY

## 2016-11-22 DIAGNOSIS — Z85828 Personal history of other malignant neoplasm of skin: Secondary | ICD-10-CM | POA: Diagnosis not present

## 2016-11-22 DIAGNOSIS — L570 Actinic keratosis: Secondary | ICD-10-CM | POA: Diagnosis not present

## 2016-11-22 DIAGNOSIS — L905 Scar conditions and fibrosis of skin: Secondary | ICD-10-CM | POA: Diagnosis not present

## 2016-11-24 ENCOUNTER — Ambulatory Visit (INDEPENDENT_AMBULATORY_CARE_PROVIDER_SITE_OTHER): Payer: Medicare HMO | Admitting: *Deleted

## 2016-11-24 DIAGNOSIS — Z5181 Encounter for therapeutic drug level monitoring: Secondary | ICD-10-CM

## 2016-11-24 DIAGNOSIS — Z7901 Long term (current) use of anticoagulants: Secondary | ICD-10-CM | POA: Diagnosis not present

## 2016-11-24 DIAGNOSIS — I4891 Unspecified atrial fibrillation: Secondary | ICD-10-CM | POA: Diagnosis not present

## 2016-11-24 LAB — POCT INR: INR: 2.5

## 2016-12-28 DIAGNOSIS — I4891 Unspecified atrial fibrillation: Secondary | ICD-10-CM | POA: Diagnosis not present

## 2016-12-28 DIAGNOSIS — I129 Hypertensive chronic kidney disease with stage 1 through stage 4 chronic kidney disease, or unspecified chronic kidney disease: Secondary | ICD-10-CM | POA: Diagnosis not present

## 2016-12-28 DIAGNOSIS — N183 Chronic kidney disease, stage 3 (moderate): Secondary | ICD-10-CM | POA: Diagnosis not present

## 2017-01-05 ENCOUNTER — Ambulatory Visit (INDEPENDENT_AMBULATORY_CARE_PROVIDER_SITE_OTHER): Payer: Medicare HMO | Admitting: *Deleted

## 2017-01-05 DIAGNOSIS — Z5181 Encounter for therapeutic drug level monitoring: Secondary | ICD-10-CM

## 2017-01-05 DIAGNOSIS — I4891 Unspecified atrial fibrillation: Secondary | ICD-10-CM | POA: Diagnosis not present

## 2017-01-05 LAB — POCT INR: INR: 2.4

## 2017-01-18 DIAGNOSIS — Z961 Presence of intraocular lens: Secondary | ICD-10-CM | POA: Diagnosis not present

## 2017-01-18 DIAGNOSIS — H26493 Other secondary cataract, bilateral: Secondary | ICD-10-CM | POA: Diagnosis not present

## 2017-01-18 DIAGNOSIS — H04123 Dry eye syndrome of bilateral lacrimal glands: Secondary | ICD-10-CM | POA: Diagnosis not present

## 2017-01-18 DIAGNOSIS — H353131 Nonexudative age-related macular degeneration, bilateral, early dry stage: Secondary | ICD-10-CM | POA: Diagnosis not present

## 2017-01-18 DIAGNOSIS — H10413 Chronic giant papillary conjunctivitis, bilateral: Secondary | ICD-10-CM | POA: Diagnosis not present

## 2017-02-05 DIAGNOSIS — M9903 Segmental and somatic dysfunction of lumbar region: Secondary | ICD-10-CM | POA: Diagnosis not present

## 2017-02-05 DIAGNOSIS — M5136 Other intervertebral disc degeneration, lumbar region: Secondary | ICD-10-CM | POA: Diagnosis not present

## 2017-02-05 DIAGNOSIS — M9905 Segmental and somatic dysfunction of pelvic region: Secondary | ICD-10-CM | POA: Diagnosis not present

## 2017-02-05 DIAGNOSIS — M9904 Segmental and somatic dysfunction of sacral region: Secondary | ICD-10-CM | POA: Diagnosis not present

## 2017-02-16 ENCOUNTER — Ambulatory Visit (INDEPENDENT_AMBULATORY_CARE_PROVIDER_SITE_OTHER): Payer: Medicare HMO | Admitting: *Deleted

## 2017-02-16 ENCOUNTER — Encounter (INDEPENDENT_AMBULATORY_CARE_PROVIDER_SITE_OTHER): Payer: Self-pay

## 2017-02-16 DIAGNOSIS — I4891 Unspecified atrial fibrillation: Secondary | ICD-10-CM | POA: Diagnosis not present

## 2017-02-16 DIAGNOSIS — Z5181 Encounter for therapeutic drug level monitoring: Secondary | ICD-10-CM

## 2017-02-16 LAB — POCT INR: INR: 2.7

## 2017-03-09 DIAGNOSIS — N183 Chronic kidney disease, stage 3 (moderate): Secondary | ICD-10-CM | POA: Diagnosis not present

## 2017-03-09 DIAGNOSIS — Z23 Encounter for immunization: Secondary | ICD-10-CM | POA: Diagnosis not present

## 2017-03-09 DIAGNOSIS — E7800 Pure hypercholesterolemia, unspecified: Secondary | ICD-10-CM | POA: Diagnosis not present

## 2017-03-09 DIAGNOSIS — D7589 Other specified diseases of blood and blood-forming organs: Secondary | ICD-10-CM | POA: Diagnosis not present

## 2017-03-09 DIAGNOSIS — I129 Hypertensive chronic kidney disease with stage 1 through stage 4 chronic kidney disease, or unspecified chronic kidney disease: Secondary | ICD-10-CM | POA: Diagnosis not present

## 2017-03-09 DIAGNOSIS — G8929 Other chronic pain: Secondary | ICD-10-CM | POA: Diagnosis not present

## 2017-03-09 DIAGNOSIS — Z79899 Other long term (current) drug therapy: Secondary | ICD-10-CM | POA: Diagnosis not present

## 2017-03-09 DIAGNOSIS — E039 Hypothyroidism, unspecified: Secondary | ICD-10-CM | POA: Diagnosis not present

## 2017-03-09 DIAGNOSIS — M5442 Lumbago with sciatica, left side: Secondary | ICD-10-CM | POA: Diagnosis not present

## 2017-03-09 DIAGNOSIS — I4891 Unspecified atrial fibrillation: Secondary | ICD-10-CM | POA: Diagnosis not present

## 2017-03-19 ENCOUNTER — Encounter: Payer: Self-pay | Admitting: Physician Assistant

## 2017-03-29 ENCOUNTER — Ambulatory Visit (INDEPENDENT_AMBULATORY_CARE_PROVIDER_SITE_OTHER): Payer: Medicare HMO | Admitting: Pharmacist

## 2017-03-29 ENCOUNTER — Encounter (INDEPENDENT_AMBULATORY_CARE_PROVIDER_SITE_OTHER): Payer: Self-pay

## 2017-03-29 ENCOUNTER — Ambulatory Visit: Payer: Medicare HMO | Admitting: Physician Assistant

## 2017-03-29 DIAGNOSIS — Z5181 Encounter for therapeutic drug level monitoring: Secondary | ICD-10-CM | POA: Diagnosis not present

## 2017-03-29 DIAGNOSIS — I4891 Unspecified atrial fibrillation: Secondary | ICD-10-CM | POA: Diagnosis not present

## 2017-03-29 LAB — POCT INR: INR: 3.1

## 2017-03-29 NOTE — Patient Instructions (Signed)
Eat an extra serving of greens tonight then continue on same dosage 1/2 tablet daily except 1 tablet Mondays, Wednesdays, and Fridays. Recheck in 4 weeks. Call us with any medication changes or concerns [phone] [facility].

## 2017-04-02 ENCOUNTER — Ambulatory Visit: Payer: Medicare HMO | Admitting: Physician Assistant

## 2017-04-02 ENCOUNTER — Encounter: Payer: Self-pay | Admitting: Physician Assistant

## 2017-04-02 NOTE — Progress Notes (Signed)
Cardiology Office Note    Date:  04/03/2017  ID:  Christopher Butler, DOB 11/16/1924, MRN [id number] PCP:  Lajean Manes, MD  Cardiologist:  Dr. Acie Fredrickson   Chief Complaint: f/u CHF, CAD, afib  History of Present Illness:  Christopher Butler is a 83 y.o. male with history of CAD with remote inferior wall myocardial infarction (status post streptokinase in 1987-did not require angioplasty), permanent atrial fibrillation, chronic Coumadin therapy, chronic systolic CHF, hypertensive heart disease, HTN, HL, CKD stage II-III, hypothyroidism, tricupsid regurgitation, tobacco abuse, chronic lower extremity edema, bilateral carotid plaque in 2007 without significant stenosis, sleep apnea and emphysema. He presents for routine follow-up. His last echo was in 07/2011 showing moderate LVH, EF 35-40%, akinesis of the entire inferior myocardium, severely dilated left atrium, mildly dilated RV/RA. Last labs in our system 06/2016 showed BUN 27, Cr 1.2, Na 139, K 4.1, albumin 3.6, AST 21, ALT, alk phos 152 (H), TBili 1.4, WBC 7.9, Hgb 14.4, Hct 40.7, plt 210k, LDL 76, TSH wnl. Subsequent labs per [facility] from PCP 03/2017 show LDL 68, trig 105, Hgb 13.7, Cr 1.2, K 4.5, ALT wnl, TSH 2.44.   He returns for follow-up doing very well. He states he's not had to use any NTG since his cath in 1987 but keeps the rx up todate. No bleeding or recent falls. He did fall once several months ago but this was while catching his wife who was falling. No dizziness. He has chronic leg pain in his left upper leg which he attributes to chronic back pain. No cramping upon ambulation. No sores/ulcers. No dyspnea or palpitations. He feels chronic edema is at baseline, likely better recently. His PCP discontinued aspirin given that he is on warfarin (presumably in setting of advanced age). He drinks 1-2 alcoholic drinks nightly.    Past Medical History:  Diagnosis Date  . Acute myocardial infarction 1987   s/p TPA  . CAD (coronary artery disease)      a. remote inferior wall myocardial infarction (status post streptokinase in 1987-did not require angioplasty).  . Cholelithiases   . Chronic systolic CHF (congestive heart failure) ([place])   . CKD (chronic kidney disease), stage II   . COPD (chronic obstructive pulmonary disease) ([place])   . Disc disease, degenerative, thoracic or thoracolumbar   . ED (erectile dysfunction)   . Emphysema   . HTN (hypertension)   . Hypercholesteremia   . Hypertensive heart disease with chronic systolic congestive heart failure ([place])   . Hyponatremia Sept 2010  . Hypothyroidism   . Permanent atrial fibrillation ([place])   . Sleep apnea   . Tobacco abuse   . Tricuspid regurgitation     Past Surgical History:  Procedure Laterality Date  . APPENDECTOMY    . CARDIAC CATHETERIZATION    . TONSILLECTOMY      Current Medications: Current Meds  Medication Sig  . amLODipine (NORVASC) 5 MG tablet Take 1 tablet (5 mg total) by mouth daily.  Marland Kitchen atorvastatin (LIPITOR) 10 MG tablet Take 1 tablet (10 mg total) by mouth daily.  Marland Kitchen levothyroxine (SYNTHROID, LEVOTHROID) 50 MCG tablet Take 50 mcg by mouth daily before breakfast.  . lisinopril (PRINIVIL,ZESTRIL) 5 MG tablet Take 1 tablet (5 mg total) by mouth daily.  Marland Kitchen LORazepam (ATIVAN) 0.5 MG tablet Take 0.5 mg by mouth daily as needed for anxiety.  . metoprolol tartrate (LOPRESSOR) 25 MG tablet Take 1 tablet (25 mg total) by mouth 2 (two) times daily.  . nitroGLYCERIN (NITROSTAT) 0.4 MG  SL tablet Place 1 tablet (0.4 mg total) under the tongue every 5 (five) minutes as needed for chest pain.  . tamsulosin (FLOMAX) 0.4 MG CAPS capsule Take 0.4 mg by mouth at bedtime.  . traMADol (ULTRAM) 50 MG tablet Take 1 tablet (50 mg total) by mouth 2 (two) times daily as needed (for pain.). ADDITIONAL REFILLS FROM PRIMARY CARE PHYSICIAN  . warfarin (COUMADIN) 5 MG tablet Take 1/2-1 tablet by mouth daily as directed by Coumadin Clini     Allergies:   Ceclor [cefaclor] and  Erythromycin   Social History   Socioeconomic History  . Marital status: Married    Spouse name: None  . Number of children: None  . Years of education: None  . Highest education level: None  Social Needs  . Financial resource strain: None  . Food insecurity - worry: None  . Food insecurity - inability: None  . Transportation needs - medical: None  . Transportation needs - non-medical: None  Occupational History  . None  Tobacco Use  . Smoking status: Current Some Day Smoker    Packs/day: 1.00  . Smokeless tobacco: Never Used  Substance and Sexual Activity  . Alcohol use: Yes  . Drug use: No  . Sexual activity: No  Other Topics Concern  . None  Social History Narrative  . None     Family History:  Family History  Family history unknown: Yes    ROS:   Please see the history of present illness.  All other systems are reviewed and otherwise negative.    PHYSICAL EXAM:   VS:  BP 130/86   Pulse 87   Ht _0  (1.626 m)   Wt 149 lb (67.6 kg)   BMI 25.58 kg/m   BMI: Body mass index is 25.58 kg/m. GEN: Well nourished, well developed elderly WM in no acute distress  HEENT: normocephalic, atraumatic Neck: no JVD, carotid bruits, or masses Cardiac: RRR; no murmurs, rubs, or gallops, trace BLE edema from shins to ankles, diminished pedal pulses bilaterally Respiratory:  clear to auscultation bilaterally, normal work of breathing GI: soft, nontender, nondistended, + BS MS: no deformity or atrophy  Skin: warm and dry, no rash Neuro:  Alert and Oriented x 3, Strength and sensation are intact, follows commands Psych: euthymic mood, full affect  Wt Readings from Last 3 Encounters:  04/03/17 149 lb (67.6 kg)  09/20/16 152 lb (68.9 kg)  03/27/16 154 lb 12.8 oz (70.2 kg)      Studies/Labs Reviewed:   EKG:  EKG was ordered today and personally reviewed by me and demonstrates atrial fib 87bpm, NSIVCD, nonspecific changes, cannot exclude prior inferior infarct -  nonacute.  Recent Labs: No results found for requested labs within last 8760 hours.   Lipid Panel    Component Value Date/Time   CHOL 139 08/11/2014 0826   TRIG 122.0 08/11/2014 0826   HDL 41.50 08/11/2014 0826   CHOLHDL 3 08/11/2014 0826   VLDL 24.4 08/11/2014 0826   LDLCALC 73 08/11/2014 0826    Additional studies/ records that were reviewed today include: Summarized above.   ASSESSMENT & PLAN:   1. CAD - doing well. Primary care stopped aspirin in light of chronic warfarin use. I think this is reasonable given his advanced age (as well as enjoyment of habitual alcohol). I will forward this note to Dr. Acie Fredrickson to make him aware. I have asked him to cut down EtOH given warfarin use. Will refill SL NTG at his request to  have on hand if needed. Continue beta blocker, statin, amlodipine. No recent angina. 2. Chronic systolic CHF - he reports chronic edema is at baseline. Weight is down several lbs from prior and he is not volume overloaded otherwise. Edema could also be sequelae of amlodipine use. Although calcium channel blocker is less ideal in LV dysfunction, he also has very remote history of coronary disease and has been doing great without any recent angina so will continue for anti-anginal effect. If this worsens in the future, could consider discontinuing amlodipine and increasing beta blocker. Reviewed 2g sodium restriction, 2L fluid restriction, daily weights with patient. 3. Permanent atrial fibrillation - rate controlled. He does not wish to switch to NOAC due to possible cost difference. He has not had any issues on warfarin. Recent surveillance labs OK. 4. HTN - controlled. 5. Hyperlipidemia - recent LDL controlled. In light of advanced age and LDL at goal, will not advance statin at this time. 6. Leg pain - he has chronic back/leg pain. Distal pulses are faint, but no ulcers. No calf cramping with activity. Symptoms are somewhat atypical for claudication but I did offer PAD  testing today. He wishes to hold off at this time.  Disposition: F/u with myself in 6 months (per patient request).   Medication Adjustments/Labs and Tests Ordered: Current medicines are reviewed at length with the patient today.  Concerns regarding medicines are outlined above. Medication changes, Labs and Tests ordered today are summarized above and listed in the Patient Instructions accessible in Encounters.   Signed, Charlie Pitter, PA-C  04/03/2017 11:30 AM    [facility] [address], [place], [place]  [address] Phone: [phone]; Fax: [phone]

## 2017-04-03 ENCOUNTER — Ambulatory Visit: Payer: Medicare HMO | Admitting: Physician Assistant

## 2017-04-03 ENCOUNTER — Encounter: Payer: Self-pay | Admitting: Physician Assistant

## 2017-04-03 VITALS — BP 130/86 | HR 87 | Ht 64.0 in | Wt 149.0 lb

## 2017-04-03 DIAGNOSIS — M79605 Pain in left leg: Secondary | ICD-10-CM | POA: Diagnosis not present

## 2017-04-03 DIAGNOSIS — E785 Hyperlipidemia, unspecified: Secondary | ICD-10-CM

## 2017-04-03 DIAGNOSIS — I10 Essential (primary) hypertension: Secondary | ICD-10-CM | POA: Diagnosis not present

## 2017-04-03 DIAGNOSIS — I2510 Atherosclerotic heart disease of native coronary artery without angina pectoris: Secondary | ICD-10-CM | POA: Diagnosis not present

## 2017-04-03 DIAGNOSIS — I482 Chronic atrial fibrillation: Secondary | ICD-10-CM

## 2017-04-03 DIAGNOSIS — I5022 Chronic systolic (congestive) heart failure: Secondary | ICD-10-CM

## 2017-04-03 DIAGNOSIS — I4821 Permanent atrial fibrillation: Secondary | ICD-10-CM

## 2017-04-03 DIAGNOSIS — R0789 Other chest pain: Secondary | ICD-10-CM

## 2017-04-03 MED ORDER — NITROGLYCERIN 0.4 MG SL SUBL: 0.4000 mg | SUBLINGUAL_TABLET | SUBLINGUAL | 99 refills | Status: DC | PRN

## 2017-04-03 NOTE — Patient Instructions (Signed)
Medication Instructions:  Your provider recommends that you continue on your current medications as directed. Please refer to the Current Medication list given to you today.    Your Nitroglycerin has been refilled.  Labwork: None  Testing/Procedures: None  Follow-Up: Your provider wants you to follow-up in: 6 months with Melina Copa, PA. You will receive a reminder letter in the mail two months in advance. If you don't receive a letter, please call our office to schedule the follow-up appointment.    Any Other Special Instructions Will Be Listed Below (If Applicable). Call us if your leg pain worsens!    If you need a refill on your cardiac medications before your next appointment, please call your pharmacy.

## 2017-04-05 ENCOUNTER — Other Ambulatory Visit: Payer: Self-pay | Admitting: Cardiovascular Disease

## 2017-05-03 ENCOUNTER — Ambulatory Visit (INDEPENDENT_AMBULATORY_CARE_PROVIDER_SITE_OTHER): Payer: Medicare HMO

## 2017-05-03 DIAGNOSIS — Z5181 Encounter for therapeutic drug level monitoring: Secondary | ICD-10-CM | POA: Diagnosis not present

## 2017-05-03 DIAGNOSIS — I4891 Unspecified atrial fibrillation: Secondary | ICD-10-CM

## 2017-05-03 LAB — POCT INR: INR: 3.2

## 2017-05-03 NOTE — Patient Instructions (Signed)
Skip today's dosage of Coumadin, then resume same dosage 1/2 tablet daily except 1 tablet Mondays, Wednesdays, and Fridays. Recheck in 4 weeks. Call us with any medication changes or concerns [phone] [facility].

## 2017-05-31 ENCOUNTER — Ambulatory Visit (INDEPENDENT_AMBULATORY_CARE_PROVIDER_SITE_OTHER): Payer: Medicare HMO | Admitting: *Deleted

## 2017-05-31 DIAGNOSIS — I4891 Unspecified atrial fibrillation: Secondary | ICD-10-CM

## 2017-05-31 DIAGNOSIS — Z5181 Encounter for therapeutic drug level monitoring: Secondary | ICD-10-CM

## 2017-05-31 LAB — POCT INR: INR: 2.5

## 2017-05-31 NOTE — Patient Instructions (Signed)
Description   Continue taking 1/2 tablet daily except 1 tablet Mondays, Wednesdays, and Fridays. Recheck in 4 weeks. Call us with any medication changes or concerns [phone] [facility].

## 2017-06-26 ENCOUNTER — Other Ambulatory Visit: Payer: Self-pay | Admitting: Physician Assistant

## 2017-06-26 MED ORDER — ATORVASTATIN CALCIUM 10 MG PO TABS: 10.0000 mg | ORAL_TABLET | Freq: Every day | ORAL | 2 refills | Status: DC

## 2017-06-28 ENCOUNTER — Ambulatory Visit (INDEPENDENT_AMBULATORY_CARE_PROVIDER_SITE_OTHER): Payer: Medicare HMO | Admitting: Pharmacist

## 2017-06-28 DIAGNOSIS — I4891 Unspecified atrial fibrillation: Secondary | ICD-10-CM

## 2017-06-28 DIAGNOSIS — Z5181 Encounter for therapeutic drug level monitoring: Secondary | ICD-10-CM | POA: Diagnosis not present

## 2017-06-28 LAB — POCT INR: INR: 2.5

## 2017-06-28 NOTE — Patient Instructions (Signed)
Description   Continue taking 1/2 tablet daily except 1 tablet Mondays, Wednesdays, and Fridays. Recheck in 6 weeks. Call us with any medication changes or concerns [phone] [facility].

## 2017-06-29 ENCOUNTER — Other Ambulatory Visit: Payer: Self-pay | Admitting: Cardiovascular Disease

## 2017-07-09 ENCOUNTER — Other Ambulatory Visit: Payer: Self-pay | Admitting: Cardiovascular Disease

## 2017-07-10 DIAGNOSIS — H353132 Nonexudative age-related macular degeneration, bilateral, intermediate dry stage: Secondary | ICD-10-CM | POA: Diagnosis not present

## 2017-07-10 DIAGNOSIS — H26493 Other secondary cataract, bilateral: Secondary | ICD-10-CM | POA: Diagnosis not present

## 2017-07-10 DIAGNOSIS — Z961 Presence of intraocular lens: Secondary | ICD-10-CM | POA: Diagnosis not present

## 2017-07-16 DIAGNOSIS — Z961 Presence of intraocular lens: Secondary | ICD-10-CM | POA: Diagnosis not present

## 2017-07-16 DIAGNOSIS — H26492 Other secondary cataract, left eye: Secondary | ICD-10-CM | POA: Diagnosis not present

## 2017-08-09 ENCOUNTER — Ambulatory Visit (INDEPENDENT_AMBULATORY_CARE_PROVIDER_SITE_OTHER): Payer: Medicare HMO

## 2017-08-09 DIAGNOSIS — Z5181 Encounter for therapeutic drug level monitoring: Secondary | ICD-10-CM | POA: Diagnosis not present

## 2017-08-09 DIAGNOSIS — I4891 Unspecified atrial fibrillation: Secondary | ICD-10-CM | POA: Diagnosis not present

## 2017-08-09 LAB — POCT INR: INR: 3.2

## 2017-08-09 NOTE — Patient Instructions (Signed)
Description   Skip today's dosage of Coumadin, then resume same dosage 1/2 tablet daily except 1 tablet Mondays, Wednesdays, and Fridays. Recheck in 4 weeks per patient request. Call us with any medication changes or concerns [phone] [facility].

## 2017-08-24 DIAGNOSIS — C44311 Basal cell carcinoma of skin of nose: Secondary | ICD-10-CM | POA: Diagnosis not present

## 2017-08-24 DIAGNOSIS — L570 Actinic keratosis: Secondary | ICD-10-CM | POA: Diagnosis not present

## 2017-09-06 ENCOUNTER — Ambulatory Visit (INDEPENDENT_AMBULATORY_CARE_PROVIDER_SITE_OTHER): Payer: Medicare HMO | Admitting: *Deleted

## 2017-09-06 DIAGNOSIS — I4891 Unspecified atrial fibrillation: Secondary | ICD-10-CM

## 2017-09-06 DIAGNOSIS — Z5181 Encounter for therapeutic drug level monitoring: Secondary | ICD-10-CM

## 2017-09-06 LAB — POCT INR: INR: 2.7

## 2017-09-06 NOTE — Patient Instructions (Signed)
Description   Continue taking Coumadin 1/2 tablet daily except 1 tablet Mondays, Wednesdays, and Fridays. Recheck in 4 weeks. Call us with any medication changes or concerns #[phone] [facility].      

## 2017-09-30 NOTE — Progress Notes (Signed)
Cardiology Office Note    Date:  10/02/2017  ID:  Louellen Molder Hamon, DOB [birth year], MRN [id number] PCP:  Lajean Manes, MD  Cardiologist:  Mertie Moores, MD   Chief Complaint: 6 month follow-up of CAD, CHF, afib  History of Present Illness:  Christopher Butler is a 84 y.o. male with history of CAD with remote inferior wall myocardial infarction (status post streptokinase in 1987-did not require angioplasty), permanent atrial fibrillation, chronic Coumadin therapy, chronic systolic CHF, hypertensive heart disease, HTN, HL, CKD stage II-III, hypothyroidism, tricupsid regurgitation, tobacco abuse, chronic lower extremity edema, bilateral carotid plaque in 2007 without significant stenosis, sleep apnea and emphysema. He presents for regular 6 month follow-up. His last echo was in 07/2011 showing moderate LVH, EF 35-40%, akinesis of the entire inferior myocardium, severely dilated left atrium, mildly dilated RV/RA. Last labs in our system 06/2016 showed BUN 27, Cr 1.2, Na 139, K 4.1, albumin 3.6, AST 21, ALT, alk phos 152 (H), TBili 1.4, WBC 7.9, Hgb 14.4, Hct 40.7, plt 210k, LDL 76, TSH wnl. Subsequent labs per [facility] from PCP 03/2017 show LDL 68, trig 105, Hgb 13.7, Cr 1.2, K 4.5, ALT wnl, TSH 2.44. His aspirin was previously discontinued by primary care. He has not wanted to switch to NOAC in the past due to possible cost difference. INR is followed by our office.  He presents back for follow-up today alone. He reports he and his wife are making plans to transition to ALF at [facility] when a room becomes available. He hopes it will be on the first floor so he can continue to take his dog out on one level. His wife's memory has been declining. He denies any CP. He does get DOE with heavier levels of exertion but states in general he remains sedentary. He has rare episodic lightheadedness. He had eye surgery and this helped. In general he's pleased with how he is doing. He sees PCP within a week for routine  labs and physical.    Past Medical History:  Diagnosis Date  . Acute myocardial infarction 1987   s/p TPA  . CAD (coronary artery disease)    a. remote inferior wall myocardial infarction (status post streptokinase in 1987-did not require angioplasty).  . Cholelithiases   . Chronic systolic CHF (congestive heart failure) ([place])   . CKD (chronic kidney disease), stage II   . COPD (chronic obstructive pulmonary disease) ([place])   . Disc disease, degenerative, thoracic or thoracolumbar   . ED (erectile dysfunction)   . Emphysema   . HTN (hypertension)   . Hypercholesteremia   . Hypertensive heart disease with chronic systolic congestive heart failure ([place])   . Hyponatremia Sept 2010  . Hypothyroidism   . Permanent atrial fibrillation ([place])   . Sleep apnea   . Tobacco abuse   . Tricuspid regurgitation     Past Surgical History:  Procedure Laterality Date  . APPENDECTOMY    . CARDIAC CATHETERIZATION    . TONSILLECTOMY      Current Medications: Current Meds  Medication Sig  . amLODipine (NORVASC) 5 MG tablet TAKE 1 TABLET BY MOUTH EVERY DAY  . atorvastatin (LIPITOR) 10 MG tablet Take 1 tablet (10 mg total) by mouth daily.  Marland Kitchen levothyroxine (SYNTHROID, LEVOTHROID) 50 MCG tablet Take 50 mcg by mouth daily before breakfast.  . LORazepam (ATIVAN) 0.5 MG tablet Take 0.5 mg by mouth daily as needed for anxiety.  . metoprolol tartrate (LOPRESSOR) 25 MG tablet TAKE 1 TABLET BY MOUTH  TWICE A DAY  . nitroGLYCERIN (NITROSTAT) 0.4 MG SL tablet Place 1 tablet (0.4 mg total) under the tongue every 5 (five) minutes as needed for chest pain.  . tamsulosin (FLOMAX) 0.4 MG CAPS capsule Take 0.4 mg by mouth at bedtime.  . traMADol (ULTRAM) 50 MG tablet Take 1 tablet (50 mg total) by mouth 2 (two) times daily as needed (for pain.). ADDITIONAL REFILLS FROM PRIMARY CARE PHYSICIAN  . warfarin (COUMADIN) 5 MG tablet TAKE as directed by coumadin clinic    Allergies:   Ceclor [cefaclor] and Erythromycin    Social History   Socioeconomic History  . Marital status: Married    Spouse name: Not on file  . Number of children: Not on file  . Years of education: Not on file  . Highest education level: Not on file  Occupational History  . Not on file  Social Needs  . Financial resource strain: Not on file  . Food insecurity:    Worry: Not on file    Inability: Not on file  . Transportation needs:    Medical: Not on file    Non-medical: Not on file  Tobacco Use  . Smoking status: Current Some Day Smoker    Packs/day: 1.00  . Smokeless tobacco: Never Used  Substance and Sexual Activity  . Alcohol use: Yes  . Drug use: No  . Sexual activity: Never  Lifestyle  . Physical activity:    Days per week: Not on file    Minutes per session: Not on file  . Stress: Not on file  Relationships  . Social connections:    Talks on phone: Not on file    Gets together: Not on file    Attends religious service: Not on file    Active member of club or organization: Not on file    Attends meetings of clubs or organizations: Not on file    Relationship status: Not on file  Other Topics Concern  . Not on file  Social History Narrative  . Not on file     Family History:  The patient's Family history is unknown by patient.  ROS:   Please see the history of present illness. Has noted weight loss which he attributes to not eating as well as his wife's dementia has limited her cooking abilities All other systems are reviewed and otherwise negative.    PHYSICAL EXAM:   VS:  BP 113/80   Pulse 60   Ht _0  (1.626 m)   Wt 143 lb 1.9 oz (64.9 kg)   SpO2 98%   BMI 24.57 kg/m   BMI: Body mass index is 24.57 kg/m. GEN: Well nourished, well developed WM in no acute distress HEENT: normocephalic, atraumatic Neck: no JVD, carotid bruits, or masses Cardiac: irregularly irregular, rate controlled, no murmurs, rubs, or gallops, no edema  Respiratory:  clear to auscultation bilaterally, normal work  of breathing GI: soft, nontender, nondistended, + BS MS: kyphotic posture Skin: warm and dry, no rash Neuro:  Alert and Oriented x 3, Strength and sensation are intact, follows commands Psych: euthymic mood, full affect  Wt Readings from Last 3 Encounters:  10/02/17 143 lb 1.9 oz (64.9 kg)  04/03/17 149 lb (67.6 kg)  09/20/16 152 lb (68.9 kg)      Studies/Labs Reviewed:   EKG:  EKG was ordered today and personally reviewed by me and demonstrates atrial fib 63bpm with one PVC with subsequent brief 1.9sec pause, nonspecific changes otherwise   Recent Labs:  No results found for requested labs within last 8760 hours.   Lipid Panel    Component Value Date/Time   CHOL 139 08/11/2014 0826   TRIG 122.0 08/11/2014 0826   HDL 41.50 08/11/2014 0826   CHOLHDL 3 08/11/2014 0826   VLDL 24.4 08/11/2014 0826   LDLCALC 73 08/11/2014 0826    Additional studies/ records that were reviewed today include: Summarized above.    ASSESSMENT & PLAN:   1. CAD - overall stable. Not on ASA at direction of primary care which is reasonable. See below re: BB. Continue atorvastatin. Lipids are followed in primary care. 2. Chronic systolic CHF - he has class 2 dyspnea with higher levels of ADLs but also remains generally sedentary which is probably contributing. His activity is primarily limited by back pain. He appears euvolemic on exam. He does get a lot of salt in his diet. We discussed general reduction in sodium. We also discussed perhaps supplementing his diet with nutritional drinks such as Ensure given his weight loss. He does not drink excess fluid daily. Although amlodipine is less ideal given LV dysfunction, given his general stability with regard to angina, will continue current regimen except decrease beta blocker as below. 3. Permanent atrial fibrillation - HR in the 60s with brief pause after PVC. He reports rare lightheadedness. Blood pressure is running a little on the softer side. Reduce  metoprolol to 12.40m BID - he will call [place] he's unable to cut the tablets in which case I would recommend switching from Lopressor to Toprol XL 235mdaily. He will also be having routine labs with PCP this upcoming week. I have asked him to have them fax [place] copy for our review. 4. HTN - follow with decrease in beta blocker. 5. Hyperlipidemia - followed by primary care.  Disposition: F/u with myself in 6 months (per patient preference, does not wish to see his primary cardiologist at this time)   Medication Adjustments/Labs and Tests Ordered: Current medicines are reviewed at length with the patient today.  Concerns regarding medicines are outlined above. Medication changes, Labs and Tests ordered today are summarized above and listed in the Patient Instructions accessible in Encounters.   Signed, DaCharlie PitterPA-C  10/02/2017 10:18 AM    [facility] [id number]  [id number]: [phone]: [phone]

## 2017-10-02 ENCOUNTER — Ambulatory Visit: Payer: Medicare HMO | Admitting: Physician Assistant

## 2017-10-02 ENCOUNTER — Encounter: Payer: Self-pay | Admitting: Physician Assistant

## 2017-10-02 ENCOUNTER — Encounter (INDEPENDENT_AMBULATORY_CARE_PROVIDER_SITE_OTHER): Payer: Self-pay

## 2017-10-02 ENCOUNTER — Ambulatory Visit (INDEPENDENT_AMBULATORY_CARE_PROVIDER_SITE_OTHER): Payer: Medicare HMO | Admitting: Pharmacist

## 2017-10-02 VITALS — BP 113/80 | HR 60 | Ht 64.0 in | Wt 143.1 lb

## 2017-10-02 DIAGNOSIS — I4821 Permanent atrial fibrillation: Secondary | ICD-10-CM

## 2017-10-02 DIAGNOSIS — Z5181 Encounter for therapeutic drug level monitoring: Secondary | ICD-10-CM | POA: Diagnosis not present

## 2017-10-02 DIAGNOSIS — I10 Essential (primary) hypertension: Secondary | ICD-10-CM | POA: Diagnosis not present

## 2017-10-02 DIAGNOSIS — I5022 Chronic systolic (congestive) heart failure: Secondary | ICD-10-CM

## 2017-10-02 DIAGNOSIS — I482 Chronic atrial fibrillation: Secondary | ICD-10-CM

## 2017-10-02 DIAGNOSIS — E785 Hyperlipidemia, unspecified: Secondary | ICD-10-CM

## 2017-10-02 DIAGNOSIS — I4891 Unspecified atrial fibrillation: Secondary | ICD-10-CM | POA: Diagnosis not present

## 2017-10-02 DIAGNOSIS — I2510 Atherosclerotic heart disease of native coronary artery without angina pectoris: Secondary | ICD-10-CM

## 2017-10-02 LAB — POCT INR: INR: 2.6 (ref 2.0–3.0)

## 2017-10-02 MED ORDER — METOPROLOL TARTRATE 25 MG PO TABS: 12.5000 mg | ORAL_TABLET | Freq: Two times a day (BID) | ORAL | 3 refills | Status: DC

## 2017-10-02 NOTE — Patient Instructions (Signed)
Description   Continue taking Coumadin 1/2 tablet daily except 1 tablet Mondays, Wednesdays, and Fridays. Recheck in 4 weeks. Call us with any medication changes or concerns #[phone] [facility].      

## 2017-10-02 NOTE — Patient Instructions (Signed)
Medication Instructions:  Your physician has recommended you make the following change in your medication:   DECREASE Metoprolol (Lopressor) to 12.5 mg (1/2 tab) twice daily   Labwork: **Please ask your Primary Care doctor to fax lab results to [phone]   Testing/Procedures: None Ordered   Follow-Up: Your physician wants you to follow-up in: 6 months with Melina Copa, PA.  You will receive a reminder letter in the mail two months in advance. If you don't receive a letter, please call our office to schedule the follow-up appointment.   If you need a refill on your cardiac medications before your next appointment, please call your pharmacy.   Thank you for choosing [facility]! Christen Bame, RN [phone]

## 2017-10-05 DIAGNOSIS — Z0000 Encounter for general adult medical examination without abnormal findings: Secondary | ICD-10-CM | POA: Diagnosis not present

## 2017-10-05 DIAGNOSIS — D7589 Other specified diseases of blood and blood-forming organs: Secondary | ICD-10-CM | POA: Diagnosis not present

## 2017-10-05 DIAGNOSIS — E039 Hypothyroidism, unspecified: Secondary | ICD-10-CM | POA: Diagnosis not present

## 2017-10-05 DIAGNOSIS — E7800 Pure hypercholesterolemia, unspecified: Secondary | ICD-10-CM | POA: Diagnosis not present

## 2017-10-05 DIAGNOSIS — Z1389 Encounter for screening for other disorder: Secondary | ICD-10-CM | POA: Diagnosis not present

## 2017-10-05 DIAGNOSIS — I4891 Unspecified atrial fibrillation: Secondary | ICD-10-CM | POA: Diagnosis not present

## 2017-10-05 DIAGNOSIS — Z79899 Other long term (current) drug therapy: Secondary | ICD-10-CM | POA: Diagnosis not present

## 2017-10-05 DIAGNOSIS — N183 Chronic kidney disease, stage 3 (moderate): Secondary | ICD-10-CM | POA: Diagnosis not present

## 2017-10-05 DIAGNOSIS — I129 Hypertensive chronic kidney disease with stage 1 through stage 4 chronic kidney disease, or unspecified chronic kidney disease: Secondary | ICD-10-CM | POA: Diagnosis not present

## 2017-10-09 DIAGNOSIS — L570 Actinic keratosis: Secondary | ICD-10-CM | POA: Diagnosis not present

## 2017-10-09 DIAGNOSIS — C44319 Basal cell carcinoma of skin of other parts of face: Secondary | ICD-10-CM | POA: Diagnosis not present

## 2017-10-09 DIAGNOSIS — L578 Other skin changes due to chronic exposure to nonionizing radiation: Secondary | ICD-10-CM | POA: Diagnosis not present

## 2017-10-25 ENCOUNTER — Other Ambulatory Visit: Payer: Self-pay | Admitting: Cardiovascular Disease

## 2017-10-29 ENCOUNTER — Ambulatory Visit (INDEPENDENT_AMBULATORY_CARE_PROVIDER_SITE_OTHER): Payer: Medicare HMO | Admitting: *Deleted

## 2017-10-29 DIAGNOSIS — Z5181 Encounter for therapeutic drug level monitoring: Secondary | ICD-10-CM | POA: Diagnosis not present

## 2017-10-29 DIAGNOSIS — I4891 Unspecified atrial fibrillation: Secondary | ICD-10-CM | POA: Diagnosis not present

## 2017-10-29 LAB — POCT INR: INR: 2.4 (ref 2.0–3.0)

## 2017-10-29 NOTE — Patient Instructions (Signed)
Description   Continue taking Coumadin 1/2 tablet daily except 1 tablet Mondays, Wednesdays, and Fridays. Recheck in 5 weeks. Call us with any medication changes or concerns [phone] [facility].

## 2017-11-02 DIAGNOSIS — R269 Unspecified abnormalities of gait and mobility: Secondary | ICD-10-CM | POA: Diagnosis not present

## 2017-11-02 DIAGNOSIS — I129 Hypertensive chronic kidney disease with stage 1 through stage 4 chronic kidney disease, or unspecified chronic kidney disease: Secondary | ICD-10-CM | POA: Diagnosis not present

## 2017-11-02 DIAGNOSIS — Z79899 Other long term (current) drug therapy: Secondary | ICD-10-CM | POA: Diagnosis not present

## 2017-11-02 DIAGNOSIS — N183 Chronic kidney disease, stage 3 (moderate): Secondary | ICD-10-CM | POA: Diagnosis not present

## 2017-11-02 DIAGNOSIS — I4891 Unspecified atrial fibrillation: Secondary | ICD-10-CM | POA: Diagnosis not present

## 2017-11-02 DIAGNOSIS — I2510 Atherosclerotic heart disease of native coronary artery without angina pectoris: Secondary | ICD-10-CM | POA: Diagnosis not present

## 2017-12-03 ENCOUNTER — Ambulatory Visit (INDEPENDENT_AMBULATORY_CARE_PROVIDER_SITE_OTHER): Payer: Medicare HMO | Admitting: *Deleted

## 2017-12-03 DIAGNOSIS — I4891 Unspecified atrial fibrillation: Secondary | ICD-10-CM | POA: Diagnosis not present

## 2017-12-03 DIAGNOSIS — Z5181 Encounter for therapeutic drug level monitoring: Secondary | ICD-10-CM | POA: Diagnosis not present

## 2017-12-03 LAB — POCT INR: INR: 2.2 (ref 2.0–3.0)

## 2017-12-03 NOTE — Patient Instructions (Signed)
Description   Continue taking Coumadin 1/2 tablet daily except 1 tablet Mondays, Wednesdays, and Fridays. Recheck in 6 weeks. Call us with any medication changes or concerns [phone] [facility].

## 2017-12-10 ENCOUNTER — Telehealth: Payer: Self-pay

## 2017-12-10 NOTE — Telephone Encounter (Signed)
Pt has a question concerning metoprolol 25 mg and atorvastatin 10 mg  He states he is getting ready to move and wants to make sure her was taking the right medications. Pt states that he is taking the metoprolol 12.5 mg twice daily but has stopped atorvastatin. Per last ov note 10-02-17, I let him know that metoprolol 25 mg had been decreased to 12.5 mg twice daily but did not see where it says to stop atorvastatin 10 mg.  Please review and advise.

## 2017-12-11 NOTE — Telephone Encounter (Signed)
You are correct. We did not make any changes to his cholesterol medications. Metoprolol was 12.5mg  BID. Signa Cheek PA-C

## 2017-12-12 DIAGNOSIS — R634 Abnormal weight loss: Secondary | ICD-10-CM | POA: Diagnosis not present

## 2017-12-12 DIAGNOSIS — L989 Disorder of the skin and subcutaneous tissue, unspecified: Secondary | ICD-10-CM | POA: Diagnosis not present

## 2017-12-12 DIAGNOSIS — K5901 Slow transit constipation: Secondary | ICD-10-CM | POA: Diagnosis not present

## 2017-12-12 DIAGNOSIS — I4891 Unspecified atrial fibrillation: Secondary | ICD-10-CM | POA: Diagnosis not present

## 2017-12-12 DIAGNOSIS — I129 Hypertensive chronic kidney disease with stage 1 through stage 4 chronic kidney disease, or unspecified chronic kidney disease: Secondary | ICD-10-CM | POA: Diagnosis not present

## 2017-12-12 DIAGNOSIS — N401 Enlarged prostate with lower urinary tract symptoms: Secondary | ICD-10-CM | POA: Diagnosis not present

## 2017-12-12 DIAGNOSIS — N183 Chronic kidney disease, stage 3 (moderate): Secondary | ICD-10-CM | POA: Diagnosis not present

## 2017-12-17 NOTE — Telephone Encounter (Signed)
Tried to reach out to pt re: Atorvastatin. Pt's contact # has been disconnected with no alternate #.

## 2017-12-22 ENCOUNTER — Other Ambulatory Visit: Payer: Self-pay | Admitting: Cardiovascular Disease

## 2018-01-15 ENCOUNTER — Ambulatory Visit (INDEPENDENT_AMBULATORY_CARE_PROVIDER_SITE_OTHER): Payer: Medicare HMO

## 2018-01-15 DIAGNOSIS — Z5181 Encounter for therapeutic drug level monitoring: Secondary | ICD-10-CM | POA: Diagnosis not present

## 2018-01-15 DIAGNOSIS — I4891 Unspecified atrial fibrillation: Secondary | ICD-10-CM

## 2018-01-15 LAB — POCT INR: INR: 1.5 — AB (ref 2.0–3.0)

## 2018-01-15 NOTE — Patient Instructions (Signed)
Please take 1 tablet today, 1.5 tablets tomorrow, then continue taking Coumadin 1/2 tablet daily except 1 tablet Mondays, Wednesdays, and Fridays. Recheck in 4 weeks. Call us with any medication changes or concerns [phone] [facility].

## 2018-01-21 ENCOUNTER — Other Ambulatory Visit: Payer: Self-pay | Admitting: Cardiovascular Disease

## 2018-01-24 DIAGNOSIS — Z23 Encounter for immunization: Secondary | ICD-10-CM | POA: Diagnosis not present

## 2018-02-08 DIAGNOSIS — L820 Inflamed seborrheic keratosis: Secondary | ICD-10-CM | POA: Diagnosis not present

## 2018-02-08 DIAGNOSIS — C44622 Squamous cell carcinoma of skin of right upper limb, including shoulder: Secondary | ICD-10-CM | POA: Diagnosis not present

## 2018-02-08 DIAGNOSIS — L570 Actinic keratosis: Secondary | ICD-10-CM | POA: Diagnosis not present

## 2018-02-08 DIAGNOSIS — C44311 Basal cell carcinoma of skin of nose: Secondary | ICD-10-CM | POA: Diagnosis not present

## 2018-02-11 DIAGNOSIS — N183 Chronic kidney disease, stage 3 (moderate): Secondary | ICD-10-CM | POA: Diagnosis not present

## 2018-02-11 DIAGNOSIS — I129 Hypertensive chronic kidney disease with stage 1 through stage 4 chronic kidney disease, or unspecified chronic kidney disease: Secondary | ICD-10-CM | POA: Diagnosis not present

## 2018-02-11 DIAGNOSIS — I4891 Unspecified atrial fibrillation: Secondary | ICD-10-CM | POA: Diagnosis not present

## 2018-02-20 ENCOUNTER — Ambulatory Visit (INDEPENDENT_AMBULATORY_CARE_PROVIDER_SITE_OTHER): Payer: Medicare HMO | Admitting: *Deleted

## 2018-02-20 DIAGNOSIS — Z5181 Encounter for therapeutic drug level monitoring: Secondary | ICD-10-CM

## 2018-02-20 DIAGNOSIS — I4891 Unspecified atrial fibrillation: Secondary | ICD-10-CM

## 2018-02-20 LAB — POCT INR: INR: 2.1 (ref 2.0–3.0)

## 2018-02-20 NOTE — Patient Instructions (Signed)
Description   Continue taking Coumadin 1/2 tablet daily except 1 tablet Mondays, Wednesdays, and Fridays. Recheck in 4 weeks. Call us with any medication changes or concerns #[phone] [facility].      

## 2018-03-20 ENCOUNTER — Ambulatory Visit (INDEPENDENT_AMBULATORY_CARE_PROVIDER_SITE_OTHER): Payer: Medicare HMO | Admitting: Pharmacist

## 2018-03-20 DIAGNOSIS — I4891 Unspecified atrial fibrillation: Secondary | ICD-10-CM | POA: Diagnosis not present

## 2018-03-20 DIAGNOSIS — Z5181 Encounter for therapeutic drug level monitoring: Secondary | ICD-10-CM

## 2018-03-20 LAB — POCT INR: INR: 2.1 (ref 2.0–3.0)

## 2018-03-20 NOTE — Patient Instructions (Signed)
Description   Continue taking Coumadin 1/2 tablet daily except 1 tablet Mondays, Wednesdays, and Fridays. Recheck in 4 weeks. Call us with any medication changes or concerns [phone] [facility].

## 2018-03-22 DIAGNOSIS — Z85828 Personal history of other malignant neoplasm of skin: Secondary | ICD-10-CM | POA: Diagnosis not present

## 2018-03-22 DIAGNOSIS — L570 Actinic keratosis: Secondary | ICD-10-CM | POA: Diagnosis not present

## 2018-03-22 DIAGNOSIS — L905 Scar conditions and fibrosis of skin: Secondary | ICD-10-CM | POA: Diagnosis not present

## 2018-03-27 ENCOUNTER — Other Ambulatory Visit: Payer: Self-pay | Admitting: Geriatric Medicine

## 2018-03-27 ENCOUNTER — Ambulatory Visit
Admission: RE | Admit: 2018-03-27 | Discharge: 2018-03-27 | Disposition: A | Discharge status: Home / Self Care | Payer: Medicare HMO | Source: Ambulatory Visit | Attending: Geriatric Medicine | Admitting: Geriatric Medicine

## 2018-03-27 DIAGNOSIS — B9789 Other viral agents as the cause of diseases classified elsewhere: Secondary | ICD-10-CM

## 2018-03-27 DIAGNOSIS — R05 Cough: Secondary | ICD-10-CM | POA: Diagnosis not present

## 2018-03-27 DIAGNOSIS — J4541 Moderate persistent asthma with (acute) exacerbation: Secondary | ICD-10-CM | POA: Diagnosis not present

## 2018-03-27 DIAGNOSIS — N183 Chronic kidney disease, stage 3 (moderate): Secondary | ICD-10-CM | POA: Diagnosis not present

## 2018-03-27 DIAGNOSIS — I129 Hypertensive chronic kidney disease with stage 1 through stage 4 chronic kidney disease, or unspecified chronic kidney disease: Secondary | ICD-10-CM | POA: Diagnosis not present

## 2018-03-27 DIAGNOSIS — I4891 Unspecified atrial fibrillation: Secondary | ICD-10-CM | POA: Diagnosis not present

## 2018-03-27 DIAGNOSIS — J069 Acute upper respiratory infection, unspecified: Secondary | ICD-10-CM | POA: Diagnosis not present

## 2018-03-28 ENCOUNTER — Encounter (HOSPITAL_COMMUNITY): Payer: Self-pay | Admitting: Emergency Medicine

## 2018-03-28 ENCOUNTER — Inpatient Hospital Stay (HOSPITAL_COMMUNITY)
Admission: EM | Admit: 2018-03-28 | Discharge: 2018-04-05 | DRG: 308 | Disposition: A | Discharge status: Home Health | Payer: Medicare HMO | Source: Skilled Nursing Facility | Attending: Internal Medicine | Admitting: Internal Medicine

## 2018-03-28 ENCOUNTER — Emergency Department (HOSPITAL_COMMUNITY): Payer: Medicare HMO

## 2018-03-28 DIAGNOSIS — I5023 Acute on chronic systolic (congestive) heart failure: Secondary | ICD-10-CM | POA: Diagnosis not present

## 2018-03-28 DIAGNOSIS — I4891 Unspecified atrial fibrillation: Secondary | ICD-10-CM | POA: Diagnosis not present

## 2018-03-28 DIAGNOSIS — E785 Hyperlipidemia, unspecified: Secondary | ICD-10-CM | POA: Diagnosis present

## 2018-03-28 DIAGNOSIS — F17210 Nicotine dependence, cigarettes, uncomplicated: Secondary | ICD-10-CM | POA: Diagnosis present

## 2018-03-28 DIAGNOSIS — Z7901 Long term (current) use of anticoagulants: Secondary | ICD-10-CM | POA: Diagnosis not present

## 2018-03-28 DIAGNOSIS — E7800 Pure hypercholesterolemia, unspecified: Secondary | ICD-10-CM | POA: Diagnosis present

## 2018-03-28 DIAGNOSIS — R062 Wheezing: Secondary | ICD-10-CM

## 2018-03-28 DIAGNOSIS — R42 Dizziness and giddiness: Secondary | ICD-10-CM | POA: Diagnosis not present

## 2018-03-28 DIAGNOSIS — Z79899 Other long term (current) drug therapy: Secondary | ICD-10-CM

## 2018-03-28 DIAGNOSIS — F0390 Unspecified dementia without behavioral disturbance: Secondary | ICD-10-CM | POA: Diagnosis present

## 2018-03-28 DIAGNOSIS — I25118 Atherosclerotic heart disease of native coronary artery with other forms of angina pectoris: Secondary | ICD-10-CM

## 2018-03-28 DIAGNOSIS — I4821 Permanent atrial fibrillation: Secondary | ICD-10-CM | POA: Diagnosis not present

## 2018-03-28 DIAGNOSIS — E039 Hypothyroidism, unspecified: Secondary | ICD-10-CM | POA: Diagnosis present

## 2018-03-28 DIAGNOSIS — N182 Chronic kidney disease, stage 2 (mild): Secondary | ICD-10-CM | POA: Diagnosis present

## 2018-03-28 DIAGNOSIS — Z79890 Hormone replacement therapy (postmenopausal): Secondary | ICD-10-CM | POA: Diagnosis not present

## 2018-03-28 DIAGNOSIS — I252 Old myocardial infarction: Secondary | ICD-10-CM

## 2018-03-28 DIAGNOSIS — F419 Anxiety disorder, unspecified: Secondary | ICD-10-CM | POA: Diagnosis present

## 2018-03-28 DIAGNOSIS — R0789 Other chest pain: Secondary | ICD-10-CM

## 2018-03-28 DIAGNOSIS — J439 Emphysema, unspecified: Secondary | ICD-10-CM | POA: Diagnosis present

## 2018-03-28 DIAGNOSIS — E871 Hypo-osmolality and hyponatremia: Secondary | ICD-10-CM | POA: Diagnosis present

## 2018-03-28 DIAGNOSIS — Z881 Allergy status to other antibiotic agents status: Secondary | ICD-10-CM | POA: Diagnosis not present

## 2018-03-28 DIAGNOSIS — I472 Ventricular tachycardia, unspecified: Secondary | ICD-10-CM

## 2018-03-28 DIAGNOSIS — Z79891 Long term (current) use of opiate analgesic: Secondary | ICD-10-CM | POA: Diagnosis not present

## 2018-03-28 DIAGNOSIS — I130 Hypertensive heart and chronic kidney disease with heart failure and stage 1 through stage 4 chronic kidney disease, or unspecified chronic kidney disease: Secondary | ICD-10-CM | POA: Diagnosis not present

## 2018-03-28 DIAGNOSIS — I081 Rheumatic disorders of both mitral and tricuspid valves: Secondary | ICD-10-CM | POA: Diagnosis present

## 2018-03-28 DIAGNOSIS — I5022 Chronic systolic (congestive) heart failure: Secondary | ICD-10-CM | POA: Diagnosis not present

## 2018-03-28 DIAGNOSIS — I248 Other forms of acute ischemic heart disease: Secondary | ICD-10-CM | POA: Diagnosis present

## 2018-03-28 DIAGNOSIS — I2510 Atherosclerotic heart disease of native coronary artery without angina pectoris: Secondary | ICD-10-CM | POA: Diagnosis present

## 2018-03-28 DIAGNOSIS — Z888 Allergy status to other drugs, medicaments and biological substances status: Secondary | ICD-10-CM | POA: Diagnosis not present

## 2018-03-28 DIAGNOSIS — J9690 Respiratory failure, unspecified, unspecified whether with hypoxia or hypercapnia: Secondary | ICD-10-CM | POA: Diagnosis present

## 2018-03-28 DIAGNOSIS — R079 Chest pain, unspecified: Secondary | ICD-10-CM | POA: Diagnosis not present

## 2018-03-28 DIAGNOSIS — I10 Essential (primary) hypertension: Secondary | ICD-10-CM | POA: Diagnosis not present

## 2018-03-28 DIAGNOSIS — N400 Enlarged prostate without lower urinary tract symptoms: Secondary | ICD-10-CM | POA: Diagnosis not present

## 2018-03-28 DIAGNOSIS — R55 Syncope and collapse: Secondary | ICD-10-CM | POA: Diagnosis not present

## 2018-03-28 DIAGNOSIS — I951 Orthostatic hypotension: Secondary | ICD-10-CM | POA: Diagnosis not present

## 2018-03-28 DIAGNOSIS — G4730 Sleep apnea, unspecified: Secondary | ICD-10-CM | POA: Diagnosis present

## 2018-03-28 DIAGNOSIS — J441 Chronic obstructive pulmonary disease with (acute) exacerbation: Secondary | ICD-10-CM | POA: Diagnosis not present

## 2018-03-28 DIAGNOSIS — E876 Hypokalemia: Secondary | ICD-10-CM | POA: Diagnosis not present

## 2018-03-28 DIAGNOSIS — I493 Ventricular premature depolarization: Secondary | ICD-10-CM | POA: Diagnosis present

## 2018-03-28 DIAGNOSIS — R0902 Hypoxemia: Secondary | ICD-10-CM | POA: Diagnosis not present

## 2018-03-28 DIAGNOSIS — R0602 Shortness of breath: Secondary | ICD-10-CM | POA: Diagnosis not present

## 2018-03-28 DIAGNOSIS — I361 Nonrheumatic tricuspid (valve) insufficiency: Secondary | ICD-10-CM | POA: Diagnosis not present

## 2018-03-28 DIAGNOSIS — I340 Nonrheumatic mitral (valve) insufficiency: Secondary | ICD-10-CM | POA: Diagnosis not present

## 2018-03-28 LAB — CBC WITH DIFFERENTIAL/PLATELET
ABS IMMATURE GRANULOCYTES: 0.08 10*3/uL — AB (ref 0.00–0.07)
Basophils Absolute: 0 10*3/uL (ref 0.0–0.1)
Basophils Relative: 0 %
Eosinophils Absolute: 0 10*3/uL (ref 0.0–0.5)
Eosinophils Relative: 0 %
HCT: 40.6 % (ref 39.0–52.0)
Hemoglobin: 13.2 g/dL (ref 13.0–17.0)
Immature Granulocytes: 1 %
LYMPHS ABS: 1 10*3/uL (ref 0.7–4.0)
Lymphocytes Relative: 8 %
MCH: 32.2 pg (ref 26.0–34.0)
MCHC: 32.5 g/dL (ref 30.0–36.0)
MCV: 99 fL (ref 80.0–100.0)
Monocytes Absolute: 0.7 10*3/uL (ref 0.1–1.0)
Monocytes Relative: 6 %
Neutro Abs: 10.2 10*3/uL — ABNORMAL HIGH (ref 1.7–7.7)
Neutrophils Relative %: 85 %
Platelets: 210 10*3/uL (ref 150–400)
RBC: 4.1 MIL/uL — ABNORMAL LOW (ref 4.22–5.81)
RDW: 12.3 % (ref 11.5–15.5)
WBC: 12 10*3/uL — ABNORMAL HIGH (ref 4.0–10.5)
nRBC: 0 % (ref 0.0–0.2)

## 2018-03-28 LAB — LIPID PANEL
Cholesterol: 170 mg/dL (ref 0–200)
HDL: 39 mg/dL — ABNORMAL LOW (ref >40)
LDL Cholesterol: 113 mg/dL — ABNORMAL HIGH (ref 0–99)
Total CHOL/HDL Ratio: 4.4 RATIO
Triglycerides: 88 mg/dL (ref <150)
VLDL: 18 mg/dL (ref 0–40)

## 2018-03-28 LAB — I-STAT CHEM 8, ED
BUN: 28 mg/dL — ABNORMAL HIGH (ref 8–23)
Calcium, Ion: 1.07 mmol/L — ABNORMAL LOW (ref 1.15–1.40)
Chloride: 99 mmol/L (ref 98–111)
Creatinine, Ser: 1.1 mg/dL (ref 0.61–1.24)
Glucose, Bld: 226 mg/dL — ABNORMAL HIGH (ref 70–99)
HCT: 43 % (ref 39.0–52.0)
Hemoglobin: 14.6 g/dL (ref 13.0–17.0)
Potassium: 3.5 mmol/L (ref 3.5–5.1)
Sodium: 131 mmol/L — ABNORMAL LOW (ref 135–145)
TCO2: 20 mmol/L — ABNORMAL LOW (ref 22–32)

## 2018-03-28 LAB — I-STAT TROPONIN, ED: TROPONIN I, POC: 0.23 ng/mL — AB (ref 0.00–0.08)

## 2018-03-28 LAB — MAGNESIUM: MAGNESIUM: 1.8 mg/dL (ref 1.7–2.4)

## 2018-03-28 LAB — COMPREHENSIVE METABOLIC PANEL
ALT: 68 U/L — ABNORMAL HIGH (ref 0–44)
AST: 98 U/L — AB (ref 15–41)
Albumin: 3.5 g/dL (ref 3.5–5.0)
Alkaline Phosphatase: 144 U/L — ABNORMAL HIGH (ref 38–126)
Anion gap: 16 — ABNORMAL HIGH (ref 5–15)
BUN: 25 mg/dL — AB (ref 8–23)
CO2: 16 mmol/L — ABNORMAL LOW (ref 22–32)
Calcium: 8.8 mg/dL — ABNORMAL LOW (ref 8.9–10.3)
Chloride: 101 mmol/L (ref 98–111)
Creatinine, Ser: 1.38 mg/dL — ABNORMAL HIGH (ref 0.61–1.24)
GFR calc Af Amer: 51 mL/min — ABNORMAL LOW (ref >60)
GFR calc non Af Amer: 44 mL/min — ABNORMAL LOW (ref >60)
Glucose, Bld: 228 mg/dL — ABNORMAL HIGH (ref 70–99)
Potassium: 3.5 mmol/L (ref 3.5–5.1)
Sodium: 133 mmol/L — ABNORMAL LOW (ref 135–145)
Total Bilirubin: 1.4 mg/dL — ABNORMAL HIGH (ref 0.3–1.2)
Total Protein: 6.7 g/dL (ref 6.5–8.1)

## 2018-03-28 LAB — APTT: aPTT: 40 seconds — ABNORMAL HIGH (ref 24–36)

## 2018-03-28 LAB — PROTIME-INR
INR: 2.42
Prothrombin Time: 26 seconds — ABNORMAL HIGH (ref 11.4–15.2)

## 2018-03-28 LAB — TROPONIN I
TROPONIN I: 4.58 ng/mL — AB (ref <0.03)
Troponin I: 0.2 ng/mL (ref <0.03)

## 2018-03-28 LAB — MRSA PCR SCREENING: MRSA by PCR: NEGATIVE

## 2018-03-28 MED ORDER — FENTANYL CITRATE (PF) 100 MCG/2ML IJ SOLN
INTRAMUSCULAR | Status: AC
  Administered 2018-03-28: 50 ug
  Filled 2018-03-28: qty 2

## 2018-03-28 MED ORDER — AMIODARONE LOAD VIA INFUSION
150.0000 mg | Freq: Once | INTRAVENOUS | Status: AC
  Administered 2018-03-28: 150 mg via INTRAVENOUS
  Filled 2018-03-28: qty 83.34

## 2018-03-28 MED ORDER — LISINOPRIL 5 MG PO TABS
5.0000 mg | ORAL_TABLET | Freq: Every day | ORAL | Status: DC
  Administered 2018-03-28 – 2018-03-30 (×3): 5 mg via ORAL
  Filled 2018-03-28 (×3): qty 1

## 2018-03-28 MED ORDER — ASPIRIN 81 MG PO CHEW
CHEWABLE_TABLET | ORAL | Status: AC
  Filled 2018-03-28: qty 4

## 2018-03-28 MED ORDER — ATORVASTATIN CALCIUM 10 MG PO TABS
10.0000 mg | ORAL_TABLET | Freq: Every day | ORAL | Status: DC
  Administered 2018-03-28 – 2018-04-05 (×9): 10 mg via ORAL
  Filled 2018-03-28 (×9): qty 1

## 2018-03-28 MED ORDER — AMIODARONE HCL IN DEXTROSE 360-4.14 MG/200ML-% IV SOLN
60.0000 mg/h | INTRAVENOUS | Status: AC
  Administered 2018-03-28 (×2): 60 mg/h via INTRAVENOUS
  Filled 2018-03-28 (×3): qty 200

## 2018-03-28 MED ORDER — LORAZEPAM 2 MG/ML IJ SOLN
INTRAMUSCULAR | Status: AC
  Administered 2018-03-28: 2 mg
  Filled 2018-03-28: qty 1

## 2018-03-28 MED ORDER — WARFARIN - PHARMACIST DOSING INPATIENT
Freq: Every day | Status: DC
  Administered 2018-03-28 – 2018-03-30 (×2)

## 2018-03-28 MED ORDER — AMIODARONE HCL IN DEXTROSE 360-4.14 MG/200ML-% IV SOLN
30.0000 mg/h | INTRAVENOUS | Status: DC
  Administered 2018-03-28 – 2018-03-29 (×5): 30 mg/h via INTRAVENOUS
  Filled 2018-03-28 (×4): qty 200

## 2018-03-28 MED ORDER — ACETAMINOPHEN 325 MG PO TABS
650.0000 mg | ORAL_TABLET | ORAL | Status: DC | PRN
  Administered 2018-03-30 (×2): 650 mg via ORAL
  Filled 2018-03-28 (×2): qty 2

## 2018-03-28 MED ORDER — ASPIRIN 81 MG PO CHEW
324.0000 mg | CHEWABLE_TABLET | Freq: Once | ORAL | Status: AC
  Administered 2018-03-28: 324 mg via ORAL

## 2018-03-28 MED ORDER — HEPARIN SODIUM (PORCINE) 5000 UNIT/ML IJ SOLN: 60.0000 [IU]/kg | Freq: Once | INTRAMUSCULAR | Status: DC

## 2018-03-28 MED ORDER — POTASSIUM CHLORIDE CRYS ER 20 MEQ PO TBCR
20.0000 meq | EXTENDED_RELEASE_TABLET | Freq: Two times a day (BID) | ORAL | Status: AC
  Administered 2018-03-29: 20 meq via ORAL
  Filled 2018-03-28 (×2): qty 1

## 2018-03-28 MED ORDER — ONDANSETRON HCL 4 MG/2ML IJ SOLN: 4.0000 mg | Freq: Four times a day (QID) | INTRAMUSCULAR | Status: DC | PRN

## 2018-03-28 MED ORDER — MAGNESIUM SULFATE 2 GM/50ML IV SOLN
2.0000 g | Freq: Once | INTRAVENOUS | Status: AC
  Administered 2018-03-28: 2 g via INTRAVENOUS
  Filled 2018-03-28: qty 50

## 2018-03-28 MED ORDER — NITROGLYCERIN 0.4 MG SL SUBL: 0.4000 mg | SUBLINGUAL_TABLET | SUBLINGUAL | Status: DC | PRN

## 2018-03-28 MED ORDER — METOPROLOL TARTRATE 5 MG/5ML IV SOLN
INTRAVENOUS | Status: AC
  Filled 2018-03-28: qty 5

## 2018-03-28 MED ORDER — WARFARIN SODIUM 2.5 MG PO TABS
2.5000 mg | ORAL_TABLET | Freq: Once | ORAL | Status: AC
  Administered 2018-03-28: 2.5 mg via ORAL
  Filled 2018-03-28 (×2): qty 1

## 2018-03-28 MED ORDER — METOPROLOL TARTRATE 12.5 MG HALF TABLET
12.5000 mg | ORAL_TABLET | Freq: Two times a day (BID) | ORAL | Status: DC
  Administered 2018-03-29 – 2018-03-30 (×2): 12.5 mg via ORAL
  Filled 2018-03-28 (×4): qty 1

## 2018-03-28 MED ORDER — LEVOTHYROXINE SODIUM 50 MCG PO TABS
50.0000 ug | ORAL_TABLET | Freq: Every day | ORAL | Status: DC
  Administered 2018-03-29 – 2018-04-05 (×9): 50 ug via ORAL
  Filled 2018-03-28 (×10): qty 1

## 2018-03-28 MED ORDER — TRAMADOL HCL 50 MG PO TABS
50.0000 mg | ORAL_TABLET | Freq: Two times a day (BID) | ORAL | Status: DC | PRN
  Administered 2018-03-30: 50 mg via ORAL
  Filled 2018-03-28: qty 1

## 2018-03-28 MED ORDER — HEPARIN SODIUM (PORCINE) 5000 UNIT/ML IJ SOLN
INTRAMUSCULAR | Status: AC
  Filled 2018-03-28: qty 1

## 2018-03-28 MED ORDER — SODIUM CHLORIDE 0.9 % IV SOLN
INTRAVENOUS | Status: DC
  Administered 2018-03-28: 15:00:00 via INTRAVENOUS

## 2018-03-28 MED ORDER — TAMSULOSIN HCL 0.4 MG PO CAPS
0.4000 mg | ORAL_CAPSULE | Freq: Every day | ORAL | Status: DC
  Administered 2018-03-29 – 2018-04-04 (×6): 0.4 mg via ORAL
  Filled 2018-03-28 (×6): qty 1

## 2018-03-28 NOTE — Significant Event (Signed)
Rapid Response Event Note Called by [place] RN for pt in sustained Vtach with a pulse  Overview: Time Called: 2014 Arrival Time: 2018 Event Type: Cardiac  Initial Focused Assessment: Upon arrival, Mr. Degregory was awake, oriented x4, lying in the bed in monomorphic VT at 207 bpm.  Dr. Mliss Sax at the bedside.  Pt was already attached properly to the Zoll defibrillator and Amio bolus 150mg  infusing.  Dr. Crista Curb ordered Ativan and Fentanyl for sedation.  Ativan 2mg  and Fentanyl 50 mcg administered for sedation.  Mr. Zuercher converted back to afib spontaneously without cardioversion.  Lopressor was not administered following conversion d/t BP.   Interventions: -Attached to Zoll -Ativan 2 mg IV -Fentanyl 50 mcg -PIV -Tx to ICU   Event Summary: Pt stabilized in room HR 83 afib, 90/61 (71), RR 18, sats 94% on 2LNC, sedated  Name of Physician Notified: Dr. Mliss Sax at bedside   Outcome: Transferred (Comment)(2H11)  Event End Time: 2135  Madelynn Done

## 2018-03-28 NOTE — Progress Notes (Signed)
Troponing went up to 4.58.  Pt is asymptomatic. Afib in 90-100s. Cardiology made aware.  Idolina Primer, RN

## 2018-03-28 NOTE — ED Provider Notes (Signed)
Emergency Department Provider Note   I have reviewed the triage vital signs and the nursing notes.   HISTORY  Chief Complaint Irregular Heart Beat   HPI Christopher Butler is a 84 y.o. male who presents via EMS secondary to chest pain shortness of breath.  Patient had acute onset of chest pain shortness of breath, lethargy and pallor so EMS was called on EMS arrival patient was found to be in ventricular tachycardia with a rate over 200.  Patient was given 150 mg of amiodarone and this improved his tachycardia to his atrial fibrillation with a normal rate and resolved the shortness of breath but still has some chest pain.  On arrival here patient is only complaining of mild chest pain that seems to be improving.  Also endorses multiple episodes of syncope and near syncope recently.  He has a long history of coronary artery disease but on sure on how many stents he has.  Has mild dementia.  No recent illnesses.  Patient seems to be minimizing his symptoms but family helps.  No other associated or modifying symptoms.    Past Medical History:  Diagnosis Date  . Acute myocardial infarction 1987   s/p TPA  . CAD (coronary artery disease)    a. remote inferior wall myocardial infarction (status post streptokinase in 1987-did not require angioplasty).  . Cholelithiases   . Chronic systolic CHF (congestive heart failure) ([place])   . CKD (chronic kidney disease), stage II   . COPD (chronic obstructive pulmonary disease) ([place])   . Disc disease, degenerative, thoracic or thoracolumbar   . ED (erectile dysfunction)   . Emphysema   . HTN (hypertension)   . Hypercholesteremia   . Hypertensive heart disease with chronic systolic congestive heart failure ([place])   . Hyponatremia Sept 2010  . Hypothyroidism   . Permanent atrial fibrillation   . Sleep apnea   . Tobacco abuse   . Tricuspid regurgitation     Patient Active Problem List   Diagnosis Date Noted  . Chronic systolic CHF (congestive  heart failure) ([place]) 09/20/2016  . Coronary artery disease involving native coronary artery of native heart without angina pectoris 03/27/2016  . Herpes zoster 12/17/2013  . Encounter for therapeutic drug monitoring 05/30/2013  . Tinnitus 12/20/2012  . Hyposmolality and/or hyponatremia 12/20/2012  . Tinea corporis 04/17/2012  . Permanent atrial fibrillation 10/24/2011  . Ischemic heart disease 08/03/2010  . Tobacco abuse 08/03/2010  . Hypertensive heart disease with CHF (congestive heart failure) ([place]) 08/03/2010  . Hypercholesterolemia 08/03/2010  . Erectile dysfunction 08/03/2010  . Low back pain 08/03/2010  . Cholelithiasis 08/03/2010    Past Surgical History:  Procedure Laterality Date  . APPENDECTOMY    . CARDIAC CATHETERIZATION    . TONSILLECTOMY      Current Outpatient Rx  . Order #: [id number] Class: Normal  . Order #: [id number] Class: Historical Med  . Order #: [id number] Class: Normal  . Order #: [id number] Class: Historical Med  . Order #: [id number] Class: Normal  . Order #: [id number] Class: Normal  . Order #: [id number] Class: Historical Med  . Order #: [id number] Class: Phone In  . Order #: [id number] Class: Normal  . Order #: [id number] Class: Normal    Allergies Ceclor [cefaclor] and Erythromycin  Family History  Family history unknown: Yes    Social History Social History   Tobacco Use  . Smoking status: Current Some Day Smoker    Packs/day: 1.00  . Smokeless tobacco: Never Used  Substance Use Topics  . Alcohol use: Yes  .  Drug use: No    Review of Systems  All other systems negative except as documented in the HPI. All pertinent positives and negatives as reviewed in the HPI. ____________________________________________   PHYSICAL EXAM:  VITAL SIGNS: ED Triage Vitals  Enc Vitals Group     BP 03/28/18 1426 113/76     Pulse Rate 03/28/18 1426 98     Resp 03/28/18 1426 20     Temp 03/28/18 1426 98 F (36.7 C)     Temp src --      SpO2 03/28/18  1426 96 %     Weight 03/28/18 1430 143 lb 1.3 oz (64.9 kg)    Constitutional: Alert and oriented. Well appearing and in no acute distress. Eyes: Conjunctivae are normal. PERRL. EOMI. Head: Atraumatic. Nose: No congestion/rhinnorhea. Mouth/Throat: Mucous membranes are moist.  Oropharynx non-erythematous. Neck: No stridor.  No meningeal signs.   Cardiovascular: Normal rate, irregular rhythm. Good peripheral circulation. Grossly normal heart sounds.   Respiratory: Normal respiratory effort.  No retractions. Lungs CTAB. Gastrointestinal: Soft and nontender. No distention.  Musculoskeletal: No lower extremity tenderness nor edema. No gross deformities of extremities. Neurologic:  Normal speech and language. No gross focal neurologic deficits are appreciated.  Skin:  Skin is warm, dry and intact. No rash noted.   ____________________________________________   LABS (all labs ordered are listed, but only abnormal results are displayed)  Labs Reviewed  CBC WITH DIFFERENTIAL/PLATELET - Abnormal; Notable for the following components:      Result Value   WBC 12.0 (*)    RBC 4.10 (*)    Neutro Abs 10.2 (*)    Abs Immature Granulocytes 0.08 (*)    All other components within normal limits  PROTIME-INR - Abnormal; Notable for the following components:   Prothrombin Time 26.0 (*)    All other components within normal limits  APTT - Abnormal; Notable for the following components:   aPTT 40 (*)    All other components within normal limits  LIPID PANEL - Abnormal; Notable for the following components:   HDL 39 (*)    LDL Cholesterol 113 (*)    All other components within normal limits  I-STAT CHEM 8, ED - Abnormal; Notable for the following components:   Sodium 131 (*)    BUN 28 (*)    Glucose, Bld 226 (*)    Calcium, Ion 1.07 (*)    TCO2 20 (*)    All other components within normal limits  I-STAT TROPONIN, ED - Abnormal; Notable for the following components:   Troponin i, poc 0.23 (*)     All other components within normal limits  COMPREHENSIVE METABOLIC PANEL  TROPONIN I   ____________________________________________  EKG   EKG Interpretation  Date/Time:  Thursday March 28 2018 14:27:19 EST Ventricular Rate:  83 PR Interval:    QRS Duration: 129 QT Interval:  374 QTC Calculation: 440 R Axis:   -36 Text Interpretation:  Atrial fibrillation Nonspecific IVCD with LAD Repol abnrm, severe global ischemia (LM/MVD) suspect posterior stemi vs global ischemia Confirmed by Merrily Pew [phone]) on 03/29/2018 11:07:56 AM       My ECG Read for ECG 03/28/2018 at 1432 Indication:evaluate for posterior stemi  EKG was personally contemporaneously reviewed by myself. Rate: 97 PR Interval: afib QRS duration: 113 QT/QTC: 383/464 Axis: normal EKG: Afib V4-6 are actually 7-9 and show elevation in V4-6. Other significant findings: concern for STEMI    EKG Interpretation  Date/Time:  Thursday March 28 2018 14:43:03 EST  Ventricular Rate:  84 PR Interval:    QRS Duration: 130 QT Interval:  377 QTC Calculation: 446 R Axis:   -24 Text Interpretation:  Atrial fibrillation Ventricular premature complex Nonspecific intraventricular conduction delay Nonspecific repol abnormality, diffuse leads improving depression in V2-5 Confirmed by Merrily Pew [phone]) on 03/29/2018 11:15:44 AM        ____________________________________________  RADIOLOGY  Dg Chest Port 1 View  Result Date: 03/28/2018 CLINICAL DATA:  Chest pain EXAM: PORTABLE CHEST 1 VIEW COMPARISON:  March 27, 2018 FINDINGS: The study is limited due to patient rotation. Cardiomegaly remains. There is a tortuous thoracic aorta which is similar in the interval. No change in the heart, hila, or mediastinum. No pneumothorax. No overt edema. Mild bibasilar atelectasis. No other acute abnormalities. IMPRESSION: 1. Limited study due to the low volume portable technique. Within these limitations, there appears to  be mild bibasilar atelectasis and no other change. 2. The torturous thoracic aorta is stable radiographically. Electronically Signed   By: Dorise Bullion III M.D   On: 03/28/2018 14:55    ____________________________________________   PROCEDURES  Procedure(s) performed:   Procedures  CRITICAL CARE Performed by: Merrily Pew Total critical care time: 40 minutes Critical care time was exclusive of separately billable procedures and treating other patients. Critical care was necessary to treat or prevent imminent or life-threatening deterioration. Critical care was time spent personally by me on the following activities: development of treatment plan with patient and/or surrogate as well as nursing, discussions with consultants, evaluation of patient's response to treatment, examination of patient, obtaining history from patient or surrogate, ordering and performing treatments and interventions, ordering and review of laboratory studies, ordering and review of radiographic studies, pulse oximetry and re-evaluation of patient's condition.  ____________________________________________   INITIAL IMPRESSION / ASSESSMENT AND PLAN / ED COURSE  Patient with acute onset chest pain shortness of breath V. tach that improved with amiodarone.  Some chest pain here with changes concerning for global ischemia but not really in all leads more worrisome for posterior ST elevation.  Second EKG showed elevation of 1 mm with posterior leads in leads 7 and a associated depression in leads V4 through V5 this was concerning for posterior STEMI so code STEMI was activated.  After discussion with cardiologist, Dr. Quitman Livings, he decided to call it off and treat medically since the patient's symptoms were improving.  Patient not started on heparin secondary to being therapeutic on his Coumadin.  Cardiology to evaluate and admit.   Pertinent labs & imaging results that were available during my care of the patient were  reviewed by me and considered in my medical decision making (see chart for details).  ____________________________________________  FINAL CLINICAL IMPRESSION(S) / ED DIAGNOSES  Final diagnoses:  Ventricular tachycardia ([place])     MEDICATIONS GIVEN DURING THIS VISIT:  Medications  0.9 %  sodium chloride infusion ( Intravenous New Bag/Given 03/28/18 1449)  heparin 5000 UNIT/ML injection (has no administration in time range)  aspirin 81 MG chewable tablet (has no administration in time range)  amiodarone (NEXTERONE PREMIX) 360-4.14 MG/200ML-% (1.8 mg/mL) IV infusion (60 mg/hr Intravenous New Bag/Given 03/28/18 1513)    Followed by  amiodarone (NEXTERONE PREMIX) 360-4.14 MG/200ML-% (1.8 mg/mL) IV infusion (has no administration in time range)  aspirin chewable tablet 324 mg (324 mg Oral Given 03/28/18 1440)     NEW OUTPATIENT MEDICATIONS STARTED DURING THIS VISIT:  New Prescriptions   No medications on file    Note:  This note was prepared  with assistance of Systems analyst. Occasional wrong-word or sound-a-like substitutions may have occurred due to the inherent limitations of voice recognition software.   Merrily Pew, MD 03/29/18 1118

## 2018-03-28 NOTE — ED Triage Notes (Signed)
Pt here from nursing home with c/o v tach pt given pt given 150 amiodarone iv and converted to SR ,

## 2018-03-28 NOTE — Progress Notes (Signed)
ANTICOAGULATION CONSULT NOTE - Initial Consult  Pharmacy Consult for warfarin Indication: atrial fibrillation  Allergies  Allergen Reactions  . Ceclor [Cefaclor]   . Erythromycin     Patient Measurements: Weight: 143 lb 1.3 oz (64.9 kg)  Vital Signs: Temp: 98 F (36.7 C) (11/28 1426) BP: 124/83 (11/28 1715) Pulse Rate: 93 (11/28 1715)  Labs: Recent Labs    03/28/18 1434 03/28/18 1437  HGB 13.2 14.6  HCT 40.6 43.0  PLT 210  --   APTT 40*  --   LABPROT 26.0*  --   INR 2.42  --   CREATININE 1.38* 1.10  TROPONINI 0.20*  --     Estimated Creatinine Clearance: 36.6 mL/min (by C-G formula based on SCr of 1.1 mg/dL).   Medical History: Past Medical History:  Diagnosis Date  . Acute myocardial infarction 1987   s/p TPA  . CAD (coronary artery disease)    a. remote inferior wall myocardial infarction (status post streptokinase in 1987-did not require angioplasty).  . Cholelithiases   . Chronic systolic CHF (congestive heart failure) ([place])   . CKD (chronic kidney disease), stage II   . COPD (chronic obstructive pulmonary disease) ([place])   . Disc disease, degenerative, thoracic or thoracolumbar   . ED (erectile dysfunction)   . Emphysema   . HTN (hypertension)   . Hypercholesteremia   . Hypertensive heart disease with chronic systolic congestive heart failure ([place])   . Hyponatremia Sept 2010  . Hypothyroidism   . Permanent atrial fibrillation   . Sleep apnea   . Tobacco abuse   . Tricuspid regurgitation    Assessment: 82 yom presented with a STEMI. He is on chronic warfarin for history of afib. INR is therapeutic. No bleeding noted.   Goal of Therapy:  INR 2-3 Monitor platelets by anticoagulation protocol: Yes   Plan:  Warfarin 2.5mg  PO x 1 tonight Daily INR  Yeison Sippel, Rande Lawman 03/28/2018,5:28 PM

## 2018-03-28 NOTE — H&P (Signed)
Cardiology Admission History and Physical:   Patient ID: Christopher Butler MRN: [id number]; DOB: 1924/05/14   Admission date: 03/28/2018  Primary Care Provider: Lajean Manes, MD Primary Cardiologist: Mertie Moores, MD / Melina Copa, PA (former patient of Darlin Coco, MD) Primary Electrophysiologist:  None   Chief Complaint:  syncope  Patient Profile:   Christopher Butler is a 84 y.o. male with a history of acute myocardial infarction treated with thrombolytics in 6160, chronic systolic heart failure with LVEF 35%, permanent atrial fibrillation, history of COPD, hypertension and hypercholesterolemia, hypothyroidism, admitted after several syncopal events and found to have sustained ventricular tachycardia.  History of Present Illness:   [name] called emergency medical services today for severe weakness, dizziness and near syncope, severe diaphoresis.  Upon EMS arrival he was found to have wide-complex tachycardia with a rate of approximately 210 bpm, but was alert and oriented.  He received a single dose of amiodarone 150 mg intravenously, converting back to his baseline rhythm of atrial fibrillation with controlled ventricular response and with resolution of his complaints.  ECG obtained by EMS at 1348 hrs. shows monomorphic VT at approximately 212 bpm with right superior axis, positive concordance and very broad QRS well over 150 ms.  The subsequent ECG performed at 1418 hrs. shows atrial fibrillation with PVCs and prominent ST depression across the anterior precordial leads, QTC 441 ms.  On arrival to the emergency room 1 of his ECGs was worrisome for ST segment elevation in leads V4 V5, but repeat electrocardiogram roughly 10 minutes later showed resolution of the abnormality with return to baseline. Current rhythm is atrial fibrillation with controlled ventricular response and occasional PVCs.  At this point he is completely asymptomatic and he denies angina, dyspnea, diaphoresis,  nausea, dizziness/near syncope or palpitations.  The day before admission he had 2 syncopal events.  He simply found himself on the floor and did not recall losing consciousness or falling. He denies having any chest discomfort during any of these events, although the EMS run sheet reports that he described 4/10 chest discomfort today on their arrival.  He denies experiencing syncope until yesterday's events.  He has not had orthopnea or PND, but was battling chest "congestion" (cough productive of thick sputum) and had been using an inhaler prescribed by his primary care physician.  At his recent PCP visit he was advised to stop his amlodipine, presumably for low blood pressure.    He is a former heavy smoker, but quit in 2012. He does recall experiencing severe angina with his myocardial infarction in 1987, which was treated with thrombolytics.  He has not required cardiac catheterization or revascularization procedures since then.  He lives with his wife in an assisted living community.  His daughter is with him in the emergency room.  She works in the [employer] on [employer].   Past Medical History:  Diagnosis Date  . Acute myocardial infarction 1987   s/p TPA  . CAD (coronary artery disease)    a. remote inferior wall myocardial infarction (status post streptokinase in 1987-did not require angioplasty).  . Cholelithiases   . Chronic systolic CHF (congestive heart failure) ([place])   . CKD (chronic kidney disease), stage II   . COPD (chronic obstructive pulmonary disease) ([place])   . Disc disease, degenerative, thoracic or thoracolumbar   . ED (erectile dysfunction)   . Emphysema   . HTN (hypertension)   . Hypercholesteremia   . Hypertensive heart disease with chronic systolic congestive heart  failure ([place])   . Hyponatremia Sept 2010  . Hypothyroidism   . Permanent atrial fibrillation   . Sleep apnea   . Tobacco abuse   . Tricuspid regurgitation     Past Surgical History:    Procedure Laterality Date  . APPENDECTOMY    . CARDIAC CATHETERIZATION    . TONSILLECTOMY       Medications Prior to Admission: Prior to Admission medications   Medication Sig Start Date End Date Taking? Authorizing Provider  atorvastatin (LIPITOR) 10 MG tablet Take 1 tablet (10 mg total) by mouth daily. 06/26/17  Yes Dunn, Dayna N, PA-C  levothyroxine (SYNTHROID, LEVOTHROID) 50 MCG tablet Take 50 mcg by mouth daily before breakfast.   Yes [provider]  lisinopril (PRINIVIL,ZESTRIL) 5 MG tablet Take 1 tablet (5 mg total) by mouth daily. 10/25/17  Yes Butler, Christopher Cheng, MD  LORazepam (ATIVAN) 0.5 MG tablet Take 0.5 mg by mouth daily as needed for anxiety.   Yes [provider]  metoprolol tartrate (LOPRESSOR) 25 MG tablet Take 0.5 tablets (12.5 mg total) by mouth 2 (two) times daily. 10/02/17 03/28/18 Yes Dunn, Dayna N, PA-C  nitroGLYCERIN (NITROSTAT) 0.4 MG SL tablet Place 1 tablet (0.4 mg total) under the tongue every 5 (five) minutes as needed for chest pain. 04/03/17  Yes Dunn, Dayna N, PA-C  tamsulosin (FLOMAX) 0.4 MG CAPS capsule Take 0.4 mg by mouth at bedtime.   Yes [provider]  traMADol (ULTRAM) 50 MG tablet Take 1 tablet (50 mg total) by mouth 2 (two) times daily as needed (for pain.). ADDITIONAL REFILLS FROM PRIMARY CARE PHYSICIAN Patient taking differently: Take 50 mg by mouth 2 (two) times daily as needed (for pain.).  06/04/15  Yes Darlin Coco, MD  warfarin (COUMADIN) 5 MG tablet TAKE AS DIRECTED BY COUMADIN CLINIC Patient taking differently: Take 2.5-5 mg by mouth See admin instructions. TAKE 5 mg on mon. Wed. Fri.  Take 2.5 mg tues thurs,fri. dat and sun 12/24/17  Yes Butler, Christopher Cheng, MD  amLODipine (NORVASC) 5 MG tablet TAKE 1 TABLET BY MOUTH EVERY DAY Patient taking differently: Take 5 mg by mouth daily.  01/21/18   Butler, Christopher Cheng, MD     Allergies:    Allergies  Allergen Reactions  . Ceclor [Cefaclor]   . Erythromycin     Social  History:   Social History   Socioeconomic History  . Marital status: Married    Spouse name: Not on file  . Number of children: Not on file  . Years of education: Not on file  . Highest education level: Not on file  Occupational History  . Not on file  Social Needs  . Financial resource strain: Not on file  . Food insecurity:    Worry: Not on file    Inability: Not on file  . Transportation needs:    Medical: Not on file    Non-medical: Not on file  Tobacco Use  . Smoking status: Current Some Day Smoker    Packs/day: 1.00  . Smokeless tobacco: Never Used  Substance and Sexual Activity  . Alcohol use: Yes  . Drug use: No  . Sexual activity: Never  Lifestyle  . Physical activity:    Days per week: Not on file    Minutes per session: Not on file  . Stress: Not on file  Relationships  . Social connections:    Talks on phone: Not on file    Gets together: Not on file  Attends religious service: Not on file    Active member of club or organization: Not on file    Attends meetings of clubs or organizations: Not on file    Relationship status: Not on file  . Intimate partner violence:    Fear of current or ex partner: Not on file    Emotionally abused: Not on file    Physically abused: Not on file    Forced sexual activity: Not on file  Other Topics Concern  . Not on file  Social History Narrative  . Not on file    Family History:   The patient's Family history is unknown by patient.    ROS:  Please see the history of present illness.  All other ROS reviewed and negative.     Physical Exam/Data:   Vitals:   03/28/18 1426 03/28/18 1430 03/28/18 1500 03/28/18 1600  BP: 113/76 116/86 118/71 121/79  Pulse: 98 81 92 89  Resp: 20 (!) 21 (!) 23 20  Temp: 98 F (36.7 C)     SpO2: 96% 97% 93% 94%  Weight:  64.9 kg     No intake or output data in the 24 hours ending 03/28/18 1710 Filed Weights   03/28/18 1430  Weight: 64.9 kg   Body mass index is 24.56 kg/m.   General:  Well nourished, well developed, in no acute distress HEENT: normal Lymph: no adenopathy Neck: no JVD Endocrine:  No thryomegaly Vascular: No carotid bruits; FA pulses 2+ bilaterally without bruits  Cardiac:  normal S1, S2; irregular; no murmur  Lungs:  clear to auscultation bilaterally, no wheezing, rhonchi or rales  Abd: soft, nontender, no hepatomegaly  Ext: no edema Musculoskeletal:  No deformities, BUE and BLE strength normal and equal Skin: warm and dry  Neuro:  CNs 2-12 intact, no focal abnormalities noted Psych:  Normal affect    EKG:  The ECGs that were done were personally reviewed and are described above.  Relevant CV Studies: Echo August 25, 2011 - Left ventricle: The cavity size was normal. Wall thickness was increased in a pattern of moderate LVH. Systolic function was moderately reduced. The estimated ejection fraction was in the range of 35% to 40%. There is akinesis of the entireinferior myocardium. - Left atrium: The atrium was severely dilated. - Right ventricle: The cavity size was mildly dilated. - Right atrium: The atrium was mildly dilated. - Tricuspid valve: Moderate regurgitation. Transthoracic echocardiography. M-mode, complete   Laboratory Data:  Chemistry Recent Labs  Lab 03/28/18 1434 03/28/18 1437  NA 133* 131*  K 3.5 3.5  CL 101 99  CO2 16*  --   GLUCOSE 228* 226*  BUN 25* 28*  CREATININE 1.38* 1.10  CALCIUM 8.8*  --   GFRNONAA 44*  --   GFRAA 51*  --   ANIONGAP 16*  --     Recent Labs  Lab 03/28/18 1434  PROT 6.7  ALBUMIN 3.5  AST 98*  ALT 68*  ALKPHOS 144*  BILITOT 1.4*   Hematology Recent Labs  Lab 03/28/18 1434 03/28/18 1437  WBC 12.0*  --   RBC 4.10*  --   HGB 13.2 14.6  HCT 40.6 43.0  MCV 99.0  --   MCH 32.2  --   MCHC 32.5  --   RDW 12.3  --   PLT 210  --    Cardiac Enzymes Recent Labs  Lab 03/28/18 1434  TROPONINI 0.20*    Recent Labs  Lab 03/28/18 1435  TROPIPOC  0.23*      BNPNo results for input(s): BNP, PROBNP in the last 168 hours.  DDimer No results for input(s): DDIMER in the last 168 hours.  Radiology/Studies:  Dg Chest Port 1 View  Result Date: 03/28/2018 CLINICAL DATA:  Chest pain EXAM: PORTABLE CHEST 1 VIEW COMPARISON:  March 27, 2018 FINDINGS: The study is limited due to patient rotation. Cardiomegaly remains. There is a tortuous thoracic aorta which is similar in the interval. No change in the heart, hila, or mediastinum. No pneumothorax. No overt edema. Mild bibasilar atelectasis. No other acute abnormalities. IMPRESSION: 1. Limited study due to the low volume portable technique. Within these limitations, there appears to be mild bibasilar atelectasis and no other change. 2. The torturous thoracic aorta is stable radiographically. Electronically Signed   By: Dorise Bullion III M.D   On: 03/28/2018 14:55    Assessment and Plan:   1. VT: It appears that he has had several episodes of sustained VT leading to 2 syncopal events yesterday and near syncope today.  The morphology is monomorphic and consistent with inferior scar VT.  It resolved promptly with a single dose of amiodarone and he is now receiving an amiodarone infusion.  Chronic treatment with amiodarone will likely be of benefit.  We discussed the fact that this will not increase survival but could increase quality of life.  Compared this with a implantable defibrillator which at his age is unlikely to improve either quality or length of life.  I recommend EP opinion tomorrow regarding the dose of amiodarone and other options for treatment.  While the underlying substrate is the scar of his old myocardial infarction, the acute event might have been triggered by the use of inhaled bronchodilators for his respiratory infection and/or the respiratory infection itself.  No evidence of pneumonia on chest x-ray.  Keep potassium greater than 4.0 and magnesium around 2.0 2. CHF: He does not have findings  of acute heart failure.  He has never required loop diuretics to maintain compensation hemodynamically.  He is receiving an ACE inhibitor and a beta-blocker.  The doses of these medications should be maximized as tolerated by his heart rate and blood pressure.  Stop the amlodipine.  Repeat an echocardiogram. 3. CAD: He had marked ST segment changes on arrival to the emergency room, but these rapidly resolved and returned to baseline.  Likely they can be attributed to the extreme tachycardia that he experienced for probably extended period of time.  He is not complaining of angina.  His VT was monomorphic rather than polymorphic and more consistent with scar as the mechanism, rather than acute ischemia.  He has a minimally elevated troponin which is not surprising considering that he had sustained heart rate over 200 beats a minute.  If his echocardiogram shows any wall motion abnormalities or if the troponin shows a much higher elevation, will consider evaluation for an acute ischemic syndrome.  At this point, intravenous heparin appears unnecessary, especially since he is on chronic warfarin anticoagulation. 4. Afib: Is a permanent arrhythmia, well rate controlled.  Ventricular rate is expected to decrease as the amiodarone "kicks in".  This might eventually lead to discontinuation of his beta-blocker (which was reduced in June due to bradycardia).  For now the beta-blocker is beneficial and should be continued.  He is appropriately anticoagulated with warfarin, INR therapeutic at 2.4.  Expect a reduction in the dose of warfarin due to the interaction with amiodarone, over the next several weeks.  He has  previously declined switching to direct oral anticoagulants due to cost.  With very rare exceptions his INR is always in therapeutic range.  He does not have a history of stroke/TIA.CHADSVasc 5 (age 53, CHF, CAD, HTN). 5. COPD exacerbation: While the underlying substrate is the scar of his old myocardial  infarction, the acute event might have been triggered by the use of inhaled bronchodilators for his respiratory infection and/or the respiratory infection itself.  No evidence of pneumonia on chest x-ray.   Severity of Illness: The appropriate patient status for this patient is INPATIENT. Inpatient status is judged to be reasonable and necessary in order to provide the required intensity of service to ensure the patient's safety. The patient's presenting symptoms, physical exam findings, and initial radiographic and laboratory data in the context of their chronic comorbidities is felt to place them at high risk for further clinical deterioration. Furthermore, it is not anticipated that the patient will be medically stable for discharge from the hospital within 2 midnights of admission. The following factors support the patient status of inpatient.   " The patient's presenting symptoms include syncope, ventricular tachycardia. " The worrisome physical exam findings include advanced age and frailty, emphysematous chest, atrial fibrillation, frequent PVCs. " The initial radiographic and laboratory data are worrisome because of sustained ventricular tachycardia on ECG, permanent atrial fibrillation, abnormal cardiac enzymes. " The chronic co-morbidities include coronary artery disease, congestive heart failure, COPD, atrial fibrillation, age greater than 68 years.   * I certify that at the point of admission it is my clinical judgment that the patient will require inpatient hospital care spanning beyond 2 midnights from the point of admission due to high intensity of service, high risk for further deterioration and high frequency of surveillance required.*    For questions or updates, please contact [place] Please consult [email or web address] for contact info under        Signed, Sanda Klein, MD  03/28/2018 5:10 PM

## 2018-03-28 NOTE — Significant Event (Signed)
03/29/2018 20:05  Patient in sustained monomorphic VT at bedside (~200 bpm). Received an additional Amio 150 mg IV x 1 without change in rhythm. He was interactive and BP was stable. Decision was made to perform DCCV. However, immediately after receiving sedation, the patient spontaneously cardioverted.  Transferred to ICU. Repleted magnesium. Administered an additional dose of Metoprolol 5mg  IV. Maintained amio at 60 mg/hr overnight.  Soyla Murphy, MD Cardiology

## 2018-03-28 NOTE — ED Notes (Signed)
I Stat Trop I results of 0.23 reported to Dr. Dayna Barker.

## 2018-03-29 ENCOUNTER — Inpatient Hospital Stay (HOSPITAL_COMMUNITY): Payer: Medicare HMO

## 2018-03-29 DIAGNOSIS — I340 Nonrheumatic mitral (valve) insufficiency: Secondary | ICD-10-CM

## 2018-03-29 DIAGNOSIS — I361 Nonrheumatic tricuspid (valve) insufficiency: Secondary | ICD-10-CM

## 2018-03-29 DIAGNOSIS — E876 Hypokalemia: Secondary | ICD-10-CM

## 2018-03-29 DIAGNOSIS — R062 Wheezing: Secondary | ICD-10-CM

## 2018-03-29 LAB — BASIC METABOLIC PANEL
ANION GAP: 7 (ref 5–15)
Anion gap: 10 (ref 5–15)
BUN: 29 mg/dL — ABNORMAL HIGH (ref 8–23)
BUN: 25 mg/dL — ABNORMAL HIGH (ref 8–23)
CO2: 20 mmol/L — ABNORMAL LOW (ref 22–32)
CO2: 21 mmol/L — ABNORMAL LOW (ref 22–32)
Calcium: 8.4 mg/dL — ABNORMAL LOW (ref 8.9–10.3)
Calcium: 8.4 mg/dL — ABNORMAL LOW (ref 8.9–10.3)
Chloride: 101 mmol/L (ref 98–111)
Chloride: 103 mmol/L (ref 98–111)
Creatinine, Ser: 1.04 mg/dL (ref 0.61–1.24)
Creatinine, Ser: 1.08 mg/dL (ref 0.61–1.24)
GFR calc Af Amer: >60 mL/min (ref >60)
GFR calc non Af Amer: 60 mL/min — ABNORMAL LOW (ref >60)
Glucose, Bld: 109 mg/dL — ABNORMAL HIGH (ref 70–99)
Glucose, Bld: 133 mg/dL — ABNORMAL HIGH (ref 70–99)
POTASSIUM: 2.9 mmol/L — AB (ref 3.5–5.1)
Potassium: 3.8 mmol/L (ref 3.5–5.1)
Sodium: 131 mmol/L — ABNORMAL LOW (ref 135–145)
Sodium: 131 mmol/L — ABNORMAL LOW (ref 135–145)

## 2018-03-29 LAB — TSH: TSH: 1.44 u[IU]/mL (ref 0.350–4.500)

## 2018-03-29 LAB — ECHOCARDIOGRAM COMPLETE
Height: 63 in
Weight: 2275.2 oz

## 2018-03-29 LAB — TROPONIN I
TROPONIN I: 5.46 ng/mL — AB (ref <0.03)
Troponin I: 3.72 ng/mL (ref <0.03)
Troponin I: 1.83 ng/mL (ref <0.03)

## 2018-03-29 LAB — PROTIME-INR
INR: 3.05
Prothrombin Time: 31.1 seconds — ABNORMAL HIGH (ref 11.4–15.2)

## 2018-03-29 MED ORDER — LEVALBUTEROL HCL 0.63 MG/3ML IN NEBU
0.6300 mg | INHALATION_SOLUTION | Freq: Four times a day (QID) | RESPIRATORY_TRACT | Status: DC
  Administered 2018-03-29 – 2018-03-31 (×11): 0.63 mg via RESPIRATORY_TRACT
  Filled 2018-03-29 (×12): qty 3

## 2018-03-29 MED ORDER — ASPIRIN 325 MG PO TABS
325.0000 mg | ORAL_TABLET | Freq: Once | ORAL | Status: AC
  Administered 2018-03-29: 325 mg via ORAL
  Filled 2018-03-29: qty 1

## 2018-03-29 MED ORDER — POTASSIUM CHLORIDE CRYS ER 20 MEQ PO TBCR
20.0000 meq | EXTENDED_RELEASE_TABLET | Freq: Two times a day (BID) | ORAL | Status: DC
  Administered 2018-03-29 – 2018-03-30 (×2): 20 meq via ORAL
  Filled 2018-03-29 (×2): qty 1

## 2018-03-29 MED ORDER — WARFARIN SODIUM 2.5 MG PO TABS
2.5000 mg | ORAL_TABLET | Freq: Once | ORAL | Status: AC
  Administered 2018-03-29: 2.5 mg via ORAL
  Filled 2018-03-29: qty 1

## 2018-03-29 MED ORDER — POTASSIUM CHLORIDE CRYS ER 20 MEQ PO TBCR
40.0000 meq | EXTENDED_RELEASE_TABLET | Freq: Once | ORAL | Status: AC
  Administered 2018-03-29: 40 meq via ORAL
  Filled 2018-03-29: qty 2

## 2018-03-29 MED ORDER — ASPIRIN EC 81 MG PO TBEC
81.0000 mg | DELAYED_RELEASE_TABLET | Freq: Every day | ORAL | Status: DC
  Administered 2018-03-30 – 2018-04-05 (×7): 81 mg via ORAL
  Filled 2018-03-29 (×7): qty 1

## 2018-03-29 MED ORDER — GUAIFENESIN 100 MG/5ML PO SOLN
5.0000 mL | ORAL | Status: DC | PRN
  Administered 2018-03-29 – 2018-03-30 (×4): 100 mg via ORAL
  Filled 2018-03-29 (×4): qty 5

## 2018-03-29 MED ORDER — NICOTINE 7 MG/24HR TD PT24
7.0000 mg | MEDICATED_PATCH | Freq: Every day | TRANSDERMAL | Status: DC
  Administered 2018-03-29 – 2018-04-05 (×8): 7 mg via TRANSDERMAL
  Filled 2018-03-29 (×8): qty 1

## 2018-03-29 NOTE — Progress Notes (Signed)
Progress Note  Patient Name: FRANCISCA HARBUCK Date of Encounter: 03/29/2018  Primary Cardiologist: Mertie Moores, MD    Subjective   84 year old gentleman with a history of coronary artery disease.  He has chronic systolic congestive heart failure, atrial fibrillation, COPD, hypertension, hyper lipidemia.  He was admitted after having several episodes of syncope and then was documented to have ventricular tachycardia.  Monomorphic VT around 10:21 Thursday night   Feeling better this am    Inpatient Medications    Scheduled Meds: . atorvastatin  10 mg Oral Daily  . levothyroxine  50 mcg Oral QAC breakfast  . lisinopril  5 mg Oral Daily  . metoprolol tartrate      . metoprolol tartrate  12.5 mg Oral BID  . potassium chloride  20 mEq Oral BID  . tamsulosin  0.4 mg Oral QHS  . Warfarin - Pharmacist Dosing Inpatient   Does not apply q1800   Continuous Infusions: . sodium chloride 10 mL/hr at 03/28/18 1449  . amiodarone 30 mg/hr (03/29/18 0531)   PRN Meds: acetaminophen, nitroGLYCERIN, ondansetron (ZOFRAN) IV, traMADol   Vital Signs    Vitals:   03/29/18 0756 03/29/18 0800 03/29/18 0806 03/29/18 0807  BP:  134/90 134/90 134/90  Pulse:  (!) 102 (!) 102   Resp:      Temp: (!) 97.5 F (36.4 C)     TempSrc: Oral     SpO2:  96%    Weight:      Height:        Intake/Output Summary (Last 24 hours) at 03/29/2018 0824 Last data filed at 03/29/2018 0700 Gross per 24 hour  Intake 290.9 ml  Output 150 ml  Net 140.9 ml   Filed Weights   03/28/18 1430 03/28/18 1756  Weight: 64.9 kg 64.5 kg    Telemetry     atrial fib,   Episode of sustained VT at 2021 on Nov. 28. Monomorphic VT , very fast  - Personally Reviewed  ECG       Physical Exam  Elderly male,  NAD  GEN: No acute distress.   Neck: No JVD Cardiac:  Irreg. Irreg.  Respiratory:  bilateral wheezing  GI: Soft, nontender, non-distended  MS: No edema; No deformity. Neuro:  Nonfocal  Psych: Normal affect     Labs    Chemistry Recent Labs  Lab 03/28/18 1434 03/28/18 1437 03/29/18 0509  NA 133* 131* 131*  K 3.5 3.5 2.9*  CL 101 99 101  CO2 16*  --  20*  GLUCOSE 228* 226* 109*  BUN 25* 28* 29*  CREATININE 1.38* 1.10 1.04  CALCIUM 8.8*  --  8.4*  PROT 6.7  --   --   ALBUMIN 3.5  --   --   AST 98*  --   --   ALT 68*  --   --   ALKPHOS 144*  --   --   BILITOT 1.4*  --   --   GFRNONAA 44*  --  >60  GFRAA 51*  --  >60  ANIONGAP 16*  --  10     Hematology Recent Labs  Lab 03/28/18 1434 03/28/18 1437  WBC 12.0*  --   RBC 4.10*  --   HGB 13.2 14.6  HCT 40.6 43.0  MCV 99.0  --   MCH 32.2  --   MCHC 32.5  --   RDW 12.3  --   PLT 210  --     Cardiac Enzymes Recent Labs  Lab 03/28/18 1434 03/28/18 1710 03/28/18 2307 03/29/18 0509  TROPONINI 0.20* 4.58* 5.46* 3.72*    Recent Labs  Lab 03/28/18 1435  TROPIPOC 0.23*     BNPNo results for input(s): BNP, PROBNP in the last 168 hours.   DDimer No results for input(s): DDIMER in the last 168 hours.   Radiology    Dg Chest 2 View  Result Date: 03/27/2018 CLINICAL DATA:  Cough, shortness of breath. EXAM: CHEST - 2 VIEW COMPARISON:  Radiographs of October 24, 2011. FINDINGS: Stable cardiomegaly. No pneumothorax or pleural effusion is noted. Atherosclerosis of thoracic aorta is noted. Mild bibasilar subsegmental atelectasis or scarring is noted. Bony thorax is unremarkable. IMPRESSION: Mild bibasilar subsegmental atelectasis or scarring. Aortic Atherosclerosis (ICD10-I70.0). Electronically Signed   By: Marijo Conception, M.D.   On: 03/27/2018 10:30   Dg Chest Port 1 View  Result Date: 03/28/2018 CLINICAL DATA:  Chest pain EXAM: PORTABLE CHEST 1 VIEW COMPARISON:  March 27, 2018 FINDINGS: The study is limited due to patient rotation. Cardiomegaly remains. There is a tortuous thoracic aorta which is similar in the interval. No change in the heart, hila, or mediastinum. No pneumothorax. No overt edema. Mild bibasilar  atelectasis. No other acute abnormalities. IMPRESSION: 1. Limited study due to the low volume portable technique. Within these limitations, there appears to be mild bibasilar atelectasis and no other change. 2. The torturous thoracic aorta is stable radiographically. Electronically Signed   By: Dorise Bullion III M.D   On: 03/28/2018 14:55    Cardiac Studies      Patient Profile     84 y.o. male with hx of MI. Now presents with VT and syncope    Assessment & Plan    1.   Sustained VT:   Has a hx of MI in the remote past.     Troponin levels are mildly elevated but he denies any CP  I suspect this is due to demand ischemia.   Will continue amio for now Replace potassium EP to see today   2.  Hypokalemia:   Replace K ,  He's not on a diuretic.   Sodium and potassium are both low.  No clear etiology as of now  3.  Wheezing:   Will give xopenex - scheduled Q 6 hr for today ,  Can reduce to Q 6 hr PRN tomorrow Check CXR .  WBC is 12.0  Recheck CBC tomorrow       For questions or updates, please contact [place] Please consult [email or web address] for contact info under        Signed, Mertie Moores, MD  03/29/2018, 8:24 AM

## 2018-03-29 NOTE — Progress Notes (Signed)
  Echocardiogram 2D Echocardiogram has been performed.  Christopher Butler 03/29/2018, 4:14 PM

## 2018-03-29 NOTE — Progress Notes (Signed)
ANTICOAGULATION CONSULT NOTE - Follow up [place] for warfarin Indication: atrial fibrillation  Allergies  Allergen Reactions  . Ceclor [Cefaclor]   . Erythromycin     Patient Measurements: Height: 5\' 3"  (160 cm) Weight: 142 lb 3.2 oz (64.5 kg) IBW/kg (Calculated) : 56.9  Vital Signs: Temp: 97.5 F (36.4 C) (11/29 0756) Temp Source: Oral (11/29 0756) BP: 134/90 (11/29 0807) Pulse Rate: 102 (11/29 0806)  Labs: Recent Labs    03/28/18 1434 03/28/18 1437 03/28/18 1710 03/28/18 2307 03/29/18 0509  HGB 13.2 14.6  --   --   --   HCT 40.6 43.0  --   --   --   PLT 210  --   --   --   --   APTT 40*  --   --   --   --   LABPROT 26.0*  --   --   --  31.1*  INR 2.42  --   --   --  3.05  CREATININE 1.38* 1.10  --   --  1.04  TROPONINI 0.20*  --  4.58* 5.46* 3.72*    Estimated Creatinine Clearance: 37.2 mL/min (by C-G formula based on SCr of 1.04 mg/dL).   Medical History: Past Medical History:  Diagnosis Date  . Acute myocardial infarction 1987   s/p TPA  . CAD (coronary artery disease)    a. remote inferior wall myocardial infarction (status post streptokinase in 1987-did not require angioplasty).  . Cholelithiases   . Chronic systolic CHF (congestive heart failure) ([place])   . CKD (chronic kidney disease), stage II   . COPD (chronic obstructive pulmonary disease) ([place])   . Disc disease, degenerative, thoracic or thoracolumbar   . ED (erectile dysfunction)   . Emphysema   . HTN (hypertension)   . Hypercholesteremia   . Hypertensive heart disease with chronic systolic congestive heart failure ([place])   . Hyponatremia Sept 2010  . Hypothyroidism   . Permanent atrial fibrillation   . Sleep apnea   . Tobacco abuse   . Tricuspid regurgitation    Assessment: 85 yom presented with a STEMI. He is on chronic warfarin for history of afib. Home dose warfarin 2.5 mg daily xc 5 mg M/W/F.  INR slightly supratherapeutic today at 3.05 s/p warfarin 2.5 mg x1.   He believes he took his warfarin yesterday morning but is unsure, although medication rec reports last dose yesterday. Patient reports at home he eats iceburg salads every day, he did not eat a salad yesterday. No signs/symptoms of bleeding. Noted, patient started on amiodarone, expect to see effects in upcoming weeks.   Goal of Therapy:  INR 2-3 Monitor platelets by anticoagulation protocol: Yes   Plan:  Warfarin 2.5mg  PO x 1 tonight Daily INR   Claiborne Billings, PharmD PGY2 Cardiology Pharmacy Resident Phone [phone] Please check AMION for all Pharmacist numbers by unit 03/29/2018 9:35 AM

## 2018-03-29 NOTE — Progress Notes (Signed)
CTSP for recurrent  VT  By the time I had arrived back in afib [place] most consistent with irregularly irregular VT which I dont think is aberration but is clearly distinct from the fast monomorphic VT from [place] The tn of 5 is consistent with demand but also with MI, and this could all be ischemic mediated, hence would add asa.  A little bit concerned about also adding heparin Will continue IV amio, but lido may also be of benefit if this is ischemic mediated.  However, with age, worry a lot about neuro toxicity. Reviewed with family ( grandson) and also noted that these are perilous times for a 84 yo with VT and prob MI

## 2018-03-29 NOTE — Consult Note (Addendum)
Cardiology Consultation:   Patient ID: Christopher Butler MRN: [id number]; DOB: [birth year]  Admit date: 03/28/2018 Date of Consult: 03/29/2018  Primary Care Provider: Lajean Manes, Butler Primary Cardiologist: Christopher Moores, Butler  Primary Electrophysiologist:  None    Patient Profile:   Christopher Butler is a 84 y.o. male with a hx of CAD/MI treated in 1997 with thrombolytics, permanent Afib, chronic CHF (systolic), COPD, HTN, HLD, hypothyroidism, who is being seen today for the evaluation of VT at the request of Christopher Butler.  History of Present Illness:   Christopher Butler was last seen by cardiology service in June of this year.  He was doing well without particular symptoms, was recommended to have lopressor changed to Toprol so he would not have to cut his pills with a reduction to 12.5mg  BID 2/2 some softer BP findings.  He was admitted here yesterday with recurrent syncope, episodes ofo weakness/diaphoresis and near syncope as well.  EMS found him in [facility] with stable BP, AAO,, treated with amiodarone 150mg  bolse and return of his baseline AF w/CVR.  Initially in the ED concerns for STEMI though a f/u EKG with resolved ST changes.  Pt reported URI complaints with a productive cough of late, no symptoms of angina, or fluid OL.  He is maintained on amio gtt. He had further VT overnight, RN notes report that on 2 occassions the patient had spontaneous conversion to AFib prior to being shocked  The patient has been coughing for about a week, thick sputum, no fever, using OTC remedies, did use an inhaler for a day though did not help. He denies any CP that he felt was heart related, though remarks he does tend to gave "gas pains" on/off.  No palpitations.  Until 2-3 days ago, no near syncope or syncope.  LABS NA+ 133 > 131 K+ 3.5 > 3.4 >> 2.9 Mag 1.8 (given dose) BUN/Creat 25/1.38  >  29/1.04 WBC 12.0 H/H14/43 Plts 210 Trop : 0.20 . 4.58 > 5.46 > 3.72    Past Medical History:  Diagnosis Date    . Acute myocardial infarction 1987   s/p TPA  . CAD (coronary artery disease)    a. remote inferior wall myocardial infarction (status post streptokinase in 1987-did not require angioplasty).  . Cholelithiases   . Chronic systolic CHF (congestive heart failure) ([place])   . CKD (chronic kidney disease), stage II   . COPD (chronic obstructive pulmonary disease) ([place])   . Disc disease, degenerative, thoracic or thoracolumbar   . ED (erectile dysfunction)   . Emphysema   . HTN (hypertension)   . Hypercholesteremia   . Hypertensive heart disease with chronic systolic congestive heart failure ([place])   . Hyponatremia Sept 2010  . Hypothyroidism   . Permanent atrial fibrillation   . Sleep apnea   . Tobacco abuse   . Tricuspid regurgitation     Past Surgical History:  Procedure Laterality Date  . APPENDECTOMY    . CARDIAC CATHETERIZATION    . TONSILLECTOMY       Home Medications:  Prior to Admission medications   Medication Sig Start Date End Date Taking? Authorizing Provider  atorvastatin (LIPITOR) 10 MG tablet Take 1 tablet (10 mg total) by mouth daily. 06/26/17  Yes Dunn, Christopher Butler  levothyroxine (SYNTHROID, LEVOTHROID) 50 MCG tablet Take 50 mcg by mouth daily before breakfast.   Yes Provider, Historical, Butler  lisinopril (PRINIVIL,ZESTRIL) 5 MG tablet Take 1 tablet (5 mg total) by mouth daily. 10/25/17  Yes  Christopher Butler  LORazepam (ATIVAN) 0.5 MG tablet Take 0.5 mg by mouth daily as needed for anxiety.   Yes Provider, Historical, Butler  metoprolol tartrate (LOPRESSOR) 25 MG tablet Take 0.5 tablets (12.5 mg total) by mouth 2 (two) times daily. 10/02/17 03/28/18 Yes Dunn, Christopher Butler  nitroGLYCERIN (NITROSTAT) 0.4 MG SL tablet Place 1 tablet (0.4 mg total) under the tongue every 5 (five) minutes as needed for chest pain. 04/03/17  Yes Dunn, Christopher Butler  tamsulosin (FLOMAX) 0.4 MG CAPS capsule Take 0.4 mg by mouth at bedtime.   Yes Provider, Historical, Butler  traMADol (ULTRAM) 50 MG  tablet Take 1 tablet (50 mg total) by mouth 2 (two) times daily as needed (for pain.). ADDITIONAL REFILLS FROM PRIMARY CARE PHYSICIAN Patient taking differently: Take 50 mg by mouth 2 (two) times daily as needed (for pain.).  06/04/15  Yes Christopher Coco, Butler  warfarin (COUMADIN) 5 MG tablet TAKE AS DIRECTED BY COUMADIN CLINIC Patient taking differently: Take 2.5-5 mg by mouth See admin instructions. TAKE 5 mg on mon. Wed. Fri.  Take 2.5 mg tues thurs,fri. dat and sun 12/24/17  Yes Christopher Butler  amLODipine (NORVASC) 5 MG tablet TAKE 1 TABLET BY MOUTH EVERY DAY Patient taking differently: Take 5 mg by mouth daily.  01/21/18   Christopher Butler    Inpatient Medications: Scheduled Meds: . atorvastatin  10 mg Oral Daily  . levalbuterol  0.63 mg Nebulization Q6H  . levothyroxine  50 mcg Oral QAC breakfast  . lisinopril  5 mg Oral Daily  . metoprolol tartrate  12.5 mg Oral BID  . potassium chloride  20 mEq Oral BID  . potassium chloride  20 mEq Oral BID  . potassium chloride  40 mEq Oral Once  . tamsulosin  0.4 mg Oral QHS  . Warfarin - Pharmacist Dosing Inpatient   Does not apply q1800   Continuous Infusions: . sodium chloride 10 mL/hr at 03/28/18 1449  . amiodarone 30 mg/hr (03/29/18 0531)   PRN Meds: acetaminophen, nitroGLYCERIN, ondansetron (ZOFRAN) IV, traMADol  Allergies:    Allergies  Allergen Reactions  . Ceclor [Cefaclor]   . Erythromycin     Social History:   Social History   Socioeconomic History  . Marital status: Married    Spouse name: Not on file  . Number of children: Not on file  . Years of education: Not on file  . Highest education level: Not on file  Occupational History  . Not on file  Social Needs  . Financial resource strain: Not on file  . Food insecurity:    Worry: Not on file    Inability: Not on file  . Transportation needs:    Medical: Not on file    Non-medical: Not on file  Tobacco Use  . Smoking status: Current Some Day Smoker      Packs/day: 1.00  . Smokeless tobacco: Never Used  Substance and Sexual Activity  . Alcohol use: Yes  . Drug use: No  . Sexual activity: Never  Lifestyle  . Physical activity:    Days per week: Not on file    Minutes per session: Not on file  . Stress: Not on file  Relationships  . Social connections:    Talks on phone: Not on file    Gets together: Not on file    Attends religious service: Not on file    Active member of club or organization: Not on file  Attends meetings of clubs or organizations: Not on file    Relationship status: Not on file  . Intimate partner violence:    Fear of current or ex partner: Not on file    Emotionally abused: Not on file    Physically abused: Not on file    Forced sexual activity: Not on file  Other Topics Concern  . Not on file  Social History Narrative  . Not on file    Family History:   Family History  Family history unknown: Yes     ROS:  Please see the history of present illness.  All other ROS reviewed and negative.     Physical Exam/Data:   Vitals:   03/29/18 0800 03/29/18 0806 03/29/18 0807 03/29/18 0904  BP: 134/90 134/90 134/90   Pulse: (!) 102 (!) 102    Resp:      Temp:      TempSrc:      SpO2: 96%   100%  Weight:      Height:        Intake/Output Summary (Last 24 hours) at 03/29/2018 0932 Last data filed at 03/29/2018 0700 Gross per 24 hour  Intake 290.9 ml  Output 150 ml  Net 140.9 ml   Filed Weights   03/28/18 1430 03/28/18 1756  Weight: 64.9 kg 64.5 kg   Body mass index is 25.19 kg/m.  General:  Well nourished, well developed, in no acute distress HEENT: normal Lymph: no adenopathy Neck: no JVD Endocrine:  No thryomegaly Vascular: No carotid bruits Cardiac:  irreg-irreg; no murmurs, gallops or rubs Lungs:  Bronchial breath sounds, with exp wheezes b/l  Abd: soft, nontender Ext: no edema Musculoskeletal:  No deformities, age appropriate atrophy Skin: warm and dry  Neuro:  No gross focal  abnormalities noted Psych:  Normal affect   EKG:  The EKG was personally reviewed and demonstrates:   AFib 83bpm, some slight ST elevation aVR, depression V2-5 that are less/near resolution on #3 EKG Telemetry:  Telemetry was personally reviewed and demonstrates:   AFib w/CVR, last night recurrent long episodes of MMVT rates 200's  Relevant CV Studies:  08/25/11: TTE Study Conclusions - Left ventricle: The cavity size was normal. Wall thickness was increased in a pattern of moderate LVH. Systolic function was moderately reduced. The estimated ejection fraction was in the range of 35% to 40%. There is akinesis of the entireinferior myocardium. - Left atrium: The atrium was severely dilated. - Right ventricle: The cavity size was mildly dilated. - Right atrium: The atrium was mildly dilated. - Tricuspid valve: Moderate regurgitation.  Laboratory Data:  Chemistry Recent Labs  Lab 03/28/18 1434 03/28/18 1437 03/29/18 0509  NA 133* 131* 131*  K 3.5 3.5 2.9*  CL 101 99 101  CO2 16*  --  20*  GLUCOSE 228* 226* 109*  BUN 25* 28* 29*  CREATININE 1.38* 1.10 1.04  CALCIUM 8.8*  --  8.4*  GFRNONAA 44*  --  >60  GFRAA 51*  --  >60  ANIONGAP 16*  --  10    Recent Labs  Lab 03/28/18 1434  PROT 6.7  ALBUMIN 3.5  AST 98*  ALT 68*  ALKPHOS 144*  BILITOT 1.4*   Hematology Recent Labs  Lab 03/28/18 1434 03/28/18 1437  WBC 12.0*  --   RBC 4.10*  --   HGB 13.2 14.6  HCT 40.6 43.0  MCV 99.0  --   MCH 32.2  --   MCHC 32.5  --  RDW 12.3  --   PLT 210  --    Cardiac Enzymes Recent Labs  Lab 03/28/18 1434 03/28/18 1710 03/28/18 2307 03/29/18 0509  TROPONINI 0.20* 4.58* 5.46* 3.72*    Recent Labs  Lab 03/28/18 1435  TROPIPOC 0.23*    BNPNo results for input(s): BNP, PROBNP in the last 168 hours.  DDimer No results for input(s): DDIMER in the last 168 hours.  Radiology/Studies:    Dg Chest Port 1 View Result Date: 03/28/2018 CLINICAL DATA:  Chest  pain EXAM: PORTABLE CHEST 1 VIEW COMPARISON:  March 27, 2018 FINDINGS: The study is limited due to patient rotation. Cardiomegaly remains. There is a tortuous thoracic aorta which is similar in the interval. No change in the heart, hila, or mediastinum. No pneumothorax. No overt edema. Mild bibasilar atelectasis. No other acute abnormalities. IMPRESSION: 1. Limited study due to the low volume portable technique. Within these limitations, there appears to be mild bibasilar atelectasis and no other change. 2. The torturous thoracic aorta is stable radiographically. Electronically Signed   By: Dorise Bullion III M.D   On: 03/28/2018 14:55    Assessment and Plan:   1. Symptomatic, MMVT w/recurrent syncope in the last 2 days 2. CAD     Elevated Trop, felt to be demand     No recent or active anginal symptoms 3. Hypokalemia, hyponatremia     Being addressed already 4. Known CM (presumed ischemic)     Does not appear fluid OL at this time 5. Recent cough     Xray without pneumonia     Deferred to primary team  Dr. Curt Bears has seen and examined the patient Discussed with Christopher Butler  No plans for cath at this time Continue Amiodarone gtt 24 more hours, if no VT, consider transition to PO loading dose   For questions or updates, please contact [facility] Please consult [email or web address] for contact info under     Signed, Baldwin [place], Butler  03/29/2018 9:32 AM  I have seen and examined this patient with Tommye Standard.  Agree with above, note added to reflect my findings.  On exam, iRRR, no murmurs, wheezing, course breath sounds. Patient presented after multiple episodes of syncope, found to be in VT. Received amiodarone bolus which converted him to SR. Went back into VT yesterday in the hospital and spontaneously converted. He has known CAD and is a smoker. At this point, would plan for continued amiodarone. Would continue IV for 24 hours and then switch to PO. He likely has obstructive  CAD, but would hold off on cath unless further chest pain or ischemic symptoms due to his age. Would also hold off on ICD implant due to advanced age. Would treat conservatively at this time. After 24 hours, would switch to PO amiodarone 400 mg BID.  Grason Brailsford M. Burnis Halling Butler 03/29/2018 10:41 AM

## 2018-03-30 ENCOUNTER — Other Ambulatory Visit: Payer: Self-pay

## 2018-03-30 LAB — CBC
HCT: 34.9 % — ABNORMAL LOW (ref 39.0–52.0)
Hemoglobin: 11.7 g/dL — ABNORMAL LOW (ref 13.0–17.0)
MCH: 32.6 pg (ref 26.0–34.0)
MCHC: 33.5 g/dL (ref 30.0–36.0)
MCV: 97.2 fL (ref 80.0–100.0)
PLATELETS: 188 10*3/uL (ref 150–400)
RBC: 3.59 MIL/uL — ABNORMAL LOW (ref 4.22–5.81)
RDW: 12.5 % (ref 11.5–15.5)
WBC: 10.1 10*3/uL (ref 4.0–10.5)
nRBC: 0 % (ref 0.0–0.2)

## 2018-03-30 LAB — PROTIME-INR
INR: 3.25
Prothrombin Time: 32.6 seconds — ABNORMAL HIGH (ref 11.4–15.2)

## 2018-03-30 LAB — TROPONIN I
TROPONIN I: 0.86 ng/mL — AB (ref <0.03)
Troponin I: 1.13 ng/mL (ref <0.03)

## 2018-03-30 MED ORDER — FUROSEMIDE 10 MG/ML IJ SOLN
INTRAMUSCULAR | Status: AC
  Administered 2018-03-30: 09:00:00
  Filled 2018-03-30: qty 4

## 2018-03-30 MED ORDER — AMIODARONE HCL 200 MG PO TABS
400.0000 mg | ORAL_TABLET | Freq: Two times a day (BID) | ORAL | Status: DC
  Administered 2018-03-30 – 2018-04-04 (×12): 400 mg via ORAL
  Filled 2018-03-30 (×12): qty 2

## 2018-03-30 MED ORDER — FUROSEMIDE 10 MG/ML IJ SOLN
40.0000 mg | Freq: Two times a day (BID) | INTRAMUSCULAR | Status: DC
  Administered 2018-03-30 – 2018-04-03 (×9): 40 mg via INTRAVENOUS
  Filled 2018-03-30 (×8): qty 4

## 2018-03-30 MED ORDER — BUDESONIDE 0.5 MG/2ML IN SUSP
0.5000 mg | Freq: Two times a day (BID) | RESPIRATORY_TRACT | Status: DC
  Administered 2018-03-30 – 2018-04-05 (×11): 0.5 mg via RESPIRATORY_TRACT
  Filled 2018-03-30 (×12): qty 2

## 2018-03-30 MED ORDER — MENTHOL 3 MG MT LOZG
1.0000 | LOZENGE | OROMUCOSAL | Status: DC | PRN
  Filled 2018-03-30: qty 9

## 2018-03-30 MED ORDER — LORAZEPAM 0.5 MG PO TABS
0.5000 mg | ORAL_TABLET | Freq: Three times a day (TID) | ORAL | Status: DC | PRN
  Administered 2018-03-30 – 2018-04-04 (×11): 0.5 mg via ORAL
  Filled 2018-03-30 (×11): qty 1

## 2018-03-30 MED ORDER — METOPROLOL TARTRATE 5 MG/5ML IV SOLN
INTRAVENOUS | Status: AC
  Filled 2018-03-30: qty 5

## 2018-03-30 MED ORDER — WARFARIN SODIUM 1 MG PO TABS
1.0000 mg | ORAL_TABLET | Freq: Once | ORAL | Status: AC
  Administered 2018-03-30: 1 mg via ORAL
  Filled 2018-03-30: qty 1

## 2018-03-30 MED ORDER — IPRATROPIUM BROMIDE 0.02 % IN SOLN
0.5000 mg | Freq: Four times a day (QID) | RESPIRATORY_TRACT | Status: DC
  Administered 2018-03-30 – 2018-03-31 (×5): 0.5 mg via RESPIRATORY_TRACT
  Filled 2018-03-30 (×6): qty 2.5

## 2018-03-30 MED ORDER — ENSURE ENLIVE PO LIQD
237.0000 mL | Freq: Two times a day (BID) | ORAL | Status: DC
  Administered 2018-04-01 – 2018-04-04 (×6): 237 mL via ORAL

## 2018-03-30 MED ORDER — METOPROLOL TARTRATE 12.5 MG HALF TABLET
12.5000 mg | ORAL_TABLET | Freq: Once | ORAL | Status: AC
  Administered 2018-03-30: 12.5 mg via ORAL
  Filled 2018-03-30: qty 1

## 2018-03-30 MED ORDER — METOPROLOL TARTRATE 5 MG/5ML IV SOLN
2.5000 mg | Freq: Once | INTRAVENOUS | Status: AC
  Administered 2018-03-30: 2.5 mg via INTRAVENOUS

## 2018-03-30 MED ORDER — LOSARTAN POTASSIUM 25 MG PO TABS
25.0000 mg | ORAL_TABLET | Freq: Every day | ORAL | Status: DC
  Administered 2018-03-31 – 2018-04-01 (×2): 25 mg via ORAL
  Filled 2018-03-30 (×2): qty 1

## 2018-03-30 MED ORDER — FUROSEMIDE 10 MG/ML IJ SOLN: 40.0000 mg | Freq: Once | INTRAMUSCULAR | Status: DC

## 2018-03-30 MED ORDER — POTASSIUM CHLORIDE CRYS ER 20 MEQ PO TBCR
40.0000 meq | EXTENDED_RELEASE_TABLET | Freq: Two times a day (BID) | ORAL | Status: DC
  Administered 2018-03-30 – 2018-04-05 (×12): 40 meq via ORAL
  Filled 2018-03-30 (×12): qty 2

## 2018-03-30 MED ORDER — METOPROLOL TARTRATE 25 MG PO TABS
25.0000 mg | ORAL_TABLET | Freq: Two times a day (BID) | ORAL | Status: DC
  Administered 2018-03-30: 25 mg via ORAL
  Filled 2018-03-30: qty 1

## 2018-03-30 NOTE — Progress Notes (Signed)
Pt stood at bedside for weight. During this time pt's HR went up to the 130s-140s. Pt was asymptomatic and recovered as soon as he sat back down.

## 2018-03-30 NOTE — Progress Notes (Signed)
Patients HR down to the 80's-90's and patient states breathing is better. Also received breathing treatment at the same time, probably a combination of both. Provider at bedside and will order IV Lasix as well. Will continue to monitor closely.

## 2018-03-30 NOTE — Progress Notes (Signed)
ANTICOAGULATION CONSULT NOTE - Follow up [place] for warfarin Indication: atrial fibrillation  Allergies  Allergen Reactions  . Ceclor [Cefaclor]   . Erythromycin     Patient Measurements: Height: 5\' 3"  (160 cm) Weight: 146 lb 13.2 oz (66.6 kg) IBW/kg (Calculated) : 56.9  Vital Signs: Temp: 97.4 F (36.3 C) (11/30 0748) Temp Source: Oral (11/30 0748) BP: 119/80 (11/30 1000) Pulse Rate: 84 (11/30 1000)  Labs: Recent Labs    03/28/18 1434 03/28/18 1437  03/29/18 0509 03/29/18 1544 03/29/18 1841 03/30/18 0015 03/30/18 0632  HGB 13.2 14.6  --   --   --   --   --  11.7*  HCT 40.6 43.0  --   --   --   --   --  34.9*  PLT 210  --   --   --   --   --   --  188  APTT 40*  --   --   --   --   --   --   --   LABPROT 26.0*  --   --  31.1*  --   --   --  32.6*  INR 2.42  --   --  3.05  --   --   --  3.25  CREATININE 1.38* 1.10  --  1.04 1.08  --   --   --   TROPONINI 0.20*  --    < > 3.72*  --  1.83* 1.13* 0.86*   < > = values in this interval not displayed.    Estimated Creatinine Clearance: 35.9 mL/min (by C-G formula based on SCr of 1.08 mg/dL).   Medical History: Past Medical History:  Diagnosis Date  . Acute myocardial infarction 1987   s/p TPA  . CAD (coronary artery disease)    a. remote inferior wall myocardial infarction (status post streptokinase in 1987-did not require angioplasty).  . Cholelithiases   . Chronic systolic CHF (congestive heart failure) ([place])   . CKD (chronic kidney disease), stage II   . COPD (chronic obstructive pulmonary disease) ([place])   . Disc disease, degenerative, thoracic or thoracolumbar   . ED (erectile dysfunction)   . Emphysema   . HTN (hypertension)   . Hypercholesteremia   . Hypertensive heart disease with chronic systolic congestive heart failure ([place])   . Hyponatremia Sept 2010  . Hypothyroidism   . Permanent atrial fibrillation   . Sleep apnea   . Tobacco abuse   . Tricuspid regurgitation     Assessment: 50 yom presented with a possible STEMI. He is on chronic warfarin for history of afib. Home dose warfarin 2.5 mg daily xc 5 mg M/W/F.  INR slightly supratherapeutic today at 3.25 s/p warfarin 2.5 mg x1. Possible double dose given on 11/28 (pt unsure if he took his warfarin that morning before presenting); however wouldn't expect too much of an effect on his INR as he was planned for 5 mg the next day and decreased dose given last night. Pt relatively therapeutic on home dose, been on for ~2 years. Hb decreased to 11.7, no signs/symptoms of bleeding. Noted, patient started on amiodarone, expect to see effects in upcoming weeks.   Goal of Therapy:  INR 2-3 Monitor platelets by anticoagulation protocol: Yes   Plan:  Warfarin 1 mg PO x 1 tonight to help INR return to range Daily INR/CBC   Claiborne Billings, PharmD PGY2 Cardiology Pharmacy Resident Phone [phone] Please check AMION for all Pharmacist numbers  by unit 03/30/2018 10:37 AM

## 2018-03-30 NOTE — Progress Notes (Signed)
Pt blood pressure was 91/80 and metop was held.

## 2018-03-30 NOTE — Consult Note (Signed)
NAME:  ORBIE Butler, MRN:  [id number], DOB:  [birth year], LOS: 2 ADMISSION DATE:  03/28/2018, CONSULTATION DATE:  03/30/18 REFERRING MD:  Curt Bears, MD CHIEF COMPLAINT:  Dyspnea   Brief History   Christopher Butler is a 84 y.o. male with a history of acute myocardial infarction treated with thrombolytics in 1062, chronic systolic heart failure with LVEF 35%, permanent atrial fibrillation, possible COPD, hypertension and hypercholesterolemia, hypothyroidism, admitted after several syncopal events and found to have sustained ventricular tachycardia.   We are consulted for ongoing dyspnea despite diuresis and control of his arrhythmia with concern for COPD exacerbation.  He reports he has never been diagnosed with any lung disease previously and up until the day he presented for admission, his breath was stable. He reports he is able to walk up 3 flights of stairs without issue at home, however, since hospitalization has been significantly short of breath. He does report daily cough, productive of clear sputum. He denies fevers or chills or weight loss. He has never seen a Pulmonologist and has never had PFTs. He has smoked since age 67, up to 2 packs per day. Continues to smoke 1/2 PPD currently.  History of present illness   Christopher Butler called emergency medical services today for severe weakness, dizziness and near syncope, severe diaphoresis.  Upon EMS arrival he was found to have wide-complex tachycardia with a rate of approximately 210 bpm, but was alert and oriented.  He received a single dose of amiodarone 150 mg intravenously, converting back to his baseline rhythm of atrial fibrillation with controlled ventricular response and with resolution of his complaints.  ECG obtained by EMS at 1348 hrs. shows monomorphic VT at approximately 212 bpm with right superior axis, positive concordance and very broad QRS well over 150 ms.  The subsequent ECG performed at 1418 hrs. shows atrial fibrillation with  PVCs and prominent ST depression across the anterior precordial leads, QTC 441 ms.  On arrival to the emergency room 1 of his ECGs was worrisome for ST segment elevation in leads V4 V5, but repeat electrocardiogram roughly 10 minutes later showed resolution of the abnormality with return to baseline. Current rhythm is atrial fibrillation with controlled ventricular response and occasional PVCs.  At this point he is completely asymptomatic and he denies angina, dyspnea, diaphoresis, nausea, dizziness/near syncope or palpitations.  The day before admission he had 2 syncopal events.  He simply found himself on the floor and did not recall losing consciousness or falling. He denies having any chest discomfort during any of these events, although the EMS run sheet reports that he described 4/10 chest discomfort today on their arrival.  He denies experiencing syncope until yesterday's events.  He has not had orthopnea or PND, but was battling chest "congestion" (cough productive of thick sputum) and had been using an inhaler prescribed by his primary care physician.  At his recent PCP visit he was advised to stop his amlodipine, presumably for low blood pressure.    He is a former heavy smoker, but quit in 2012. He does recall experiencing severe angina with his myocardial infarction in 1987, which was treated with thrombolytics.  He has not required cardiac catheterization or revascularization procedures since then.   Past Medical History   Past Medical History:  Diagnosis Date  . Acute myocardial infarction 1987   s/p TPA  . CAD (coronary artery disease)    a. remote inferior wall myocardial infarction (status post streptokinase in 1987-did not require angioplasty).  Marland Kitchen  Cholelithiases   . Chronic systolic CHF (congestive heart failure) ([place])   . CKD (chronic kidney disease), stage II   . COPD (chronic obstructive pulmonary disease) ([place])   . Disc disease, degenerative, thoracic or  thoracolumbar   . ED (erectile dysfunction)   . Emphysema   . HTN (hypertension)   . Hypercholesteremia   . Hypertensive heart disease with chronic systolic congestive heart failure ([place])   . Hyponatremia Sept 2010  . Hypothyroidism   . Permanent atrial fibrillation   . Sleep apnea   . Tobacco abuse   . Tricuspid regurgitation      Significant Hospital Events   n/a  Consults:  PCCM  Procedures:  n/a  Significant Diagnostic Tests:  n/a  Micro Data:  n/a  Antimicrobials:  n/a   Interim history/subjective:  Patient reports ongoing shortness of breath at rest and with exertion.  Objective   Blood pressure 120/76, pulse 77, temperature 98.2 F (36.8 C), temperature source Oral, resp. rate (!) 23, height 5\' 3"  (1.6 m), weight 66.6 kg, SpO2 93 %.        Intake/Output Summary (Last 24 hours) at 03/30/2018 1717 Last data filed at 03/30/2018 1600 Gross per 24 hour  Intake 622.74 ml  Output 1615 ml  Net -992.26 ml   Filed Weights   03/28/18 1430 03/28/18 1756 03/30/18 0500  Weight: 64.9 kg 64.5 kg 66.6 kg    Examination: General: elderly caucasian male, NAD, pleasant and cooperative HENT: EOMI, sclera clear, Raymond in place Lungs: crackles at bases, occasional wheezing, normal work of breathing Cardiovascular: irregular rhythm, rate in 90s, no murmur appreciated Abdomen: soft, NTND Extremities: no LE edema Neuro: a&O x 3 GU: Not examined  Resolved Hospital Problem list   N/a  Assessment & Plan:   Dyspnea: Likely related to ongoing cardiac issues and potentially has a component of COPD with chronic bronchitis as well given daily cough and sputum production with long-term smoking. He has not had previous PFT testing per his report and does not use inhalers at home. He is unsure if the Xopenex is helping currently. -- Would avoid oral/IV steroids for now given lack of wheezing on exam and the likelihood to worsen fluid retention and possible delirium related to  steroids in elderly -- Agree with Xopenex q 6; will add prn as well -- Will start Ipratropium q 6 and Budesonide BID for triple inhaled therapy while inpatient -- At discharge would recommend prescription for LAMA monotherapy with Spiriva or Anoro and follow up with Pulmonary for formal PFTs and symptom assessement at that point. Medications can be adjusted as outpatient if needed.  Thank you for this consult. We will continue to follow.  Labs   CBC: Recent Labs  Lab 03/28/18 1434 03/28/18 1437 03/30/18 0632  WBC 12.0*  --  10.1  NEUTROABS 10.2*  --   --   HGB 13.2 14.6 11.7*  HCT 40.6 43.0 34.9*  MCV 99.0  --  97.2  PLT 210  --  025    Basic Metabolic Panel: Recent Labs  Lab 03/28/18 1434 03/28/18 1437 03/28/18 1710 03/29/18 0509 03/29/18 1544  NA 133* 131*  --  131* 131*  K 3.5 3.5  --  2.9* 3.8  CL 101 99  --  101 103  CO2 16*  --   --  20* 21*  GLUCOSE 228* 226*  --  109* 133*  BUN 25* 28*  --  29* 25*  CREATININE 1.38* 1.10  --  1.04  1.08  CALCIUM 8.8*  --   --  8.4* 8.4*  MG  --   --  1.8  --   --    GFR: Estimated Creatinine Clearance: 35.9 mL/min (by C-G formula based on SCr of 1.08 mg/dL). Recent Labs  Lab 03/28/18 1434 03/30/18 0632  WBC 12.0* 10.1    Liver Function Tests: Recent Labs  Lab 03/28/18 1434  AST 98*  ALT 68*  ALKPHOS 144*  BILITOT 1.4*  PROT 6.7  ALBUMIN 3.5   No results for input(s): LIPASE, AMYLASE in the last 168 hours. No results for input(s): AMMONIA in the last 168 hours.  ABG    Component Value Date/Time   TCO2 20 (L) 03/28/2018 1437     Coagulation Profile: Recent Labs  Lab 03/28/18 1434 03/29/18 0509 03/30/18 0632  INR 2.42 3.05 3.25    Cardiac Enzymes: Recent Labs  Lab 03/28/18 2307 03/29/18 0509 03/29/18 1841 03/30/18 0015 03/30/18 0632  TROPONINI 5.46* 3.72* 1.83* 1.13* 0.86*    HbA1C: Hgb A1c MFr Bld  Date/Time Value Ref Range Status  01/24/2009 09:20 AM  4.6 - 6.1 % Final   5.7 (NOTE)  The ADA recommends the following therapeutic goal for glycemic control related to Hgb A1c measurement: Goal of therapy: <6.5 Hgb A1c  Reference: American Diabetes Association: Clinical Practice Recommendations 2010, Diabetes Care, 2010, 33: (Suppl  1).    CBG: No results for input(s): GLUCAP in the last 168 hours.  Review of Systems:   Negative except as per HPI  Past Medical History  He,  has a past medical history of Acute myocardial infarction (1987), CAD (coronary artery disease), Cholelithiases, Chronic systolic CHF (congestive heart failure) ([place]), CKD (chronic kidney disease), stage II, COPD (chronic obstructive pulmonary disease) ([place]), Disc disease, degenerative, thoracic or thoracolumbar, ED (erectile dysfunction), Emphysema, HTN (hypertension), Hypercholesteremia, Hypertensive heart disease with chronic systolic congestive heart failure [facility]), Hyponatremia (Sept 2010), Hypothyroidism, Permanent atrial fibrillation, Sleep apnea, Tobacco abuse, and Tricuspid regurgitation.   Surgical History    Past Surgical History:  Procedure Laterality Date  . APPENDECTOMY    . CARDIAC CATHETERIZATION    . TONSILLECTOMY       Social History   reports that he has been smoking. He has been smoking about 1.00 pack per day. He has never used smokeless tobacco. He reports that he drinks alcohol. He reports that he does not use drugs.   Family History   His Family history is unknown by patient.   Allergies Allergies  Allergen Reactions  . Ceclor [Cefaclor]   . Erythromycin      Home Medications  Prior to Admission medications   Medication Sig Start Date End Date Taking? Authorizing Provider  atorvastatin (LIPITOR) 10 MG tablet Take 1 tablet (10 mg total) by mouth daily. 06/26/17  Yes Dunn, Dayna N, PA-C  levothyroxine (SYNTHROID, LEVOTHROID) 50 MCG tablet Take 50 mcg by mouth daily before breakfast.   Yes [provider]  lisinopril (PRINIVIL,ZESTRIL) 5 MG tablet Take 1 tablet (5  mg total) by mouth daily. 10/25/17  Yes Nahser, Wonda Cheng, MD  LORazepam (ATIVAN) 0.5 MG tablet Take 0.5 mg by mouth daily as needed for anxiety.   Yes [provider]  metoprolol tartrate (LOPRESSOR) 25 MG tablet Take 0.5 tablets (12.5 mg total) by mouth 2 (two) times daily. 10/02/17 03/28/18 Yes Dunn, Dayna N, PA-C  nitroGLYCERIN (NITROSTAT) 0.4 MG SL tablet Place 1 tablet (0.4 mg total) under the tongue every 5 (five) minutes as needed  for chest pain. 04/03/17  Yes Dunn, Dayna N, PA-C  tamsulosin (FLOMAX) 0.4 MG CAPS capsule Take 0.4 mg by mouth at bedtime.   Yes [provider]  traMADol (ULTRAM) 50 MG tablet Take 1 tablet (50 mg total) by mouth 2 (two) times daily as needed (for pain.). ADDITIONAL REFILLS FROM PRIMARY CARE PHYSICIAN Patient taking differently: Take 50 mg by mouth 2 (two) times daily as needed (for pain.).  06/04/15  Yes Darlin Coco, MD  warfarin (COUMADIN) 5 MG tablet TAKE AS DIRECTED BY COUMADIN CLINIC Patient taking differently: Take 2.5-5 mg by mouth See admin instructions. TAKE 5 mg on mon. Wed. Fri.  Take 2.5 mg tues thurs,fri. dat and sun 12/24/17  Yes Nahser, Wonda Cheng, MD  amLODipine (NORVASC) 5 MG tablet TAKE 1 TABLET BY MOUTH EVERY DAY Patient taking differently: Take 5 mg by mouth daily.  01/21/18   Nahser, Wonda Cheng, MD       Marlise Eves, MD PCCM

## 2018-03-30 NOTE — Progress Notes (Signed)
Patient gets short of breath when talking, eating, and with any activity. He is not SOB when lying flat.

## 2018-03-30 NOTE — Progress Notes (Signed)
Patient c/o being very short of breath and having mild chest discomfort mid sternum 2/10. HR 110's-140's, but ,mostly sustaining in the 120's. Breathing fast and labored as well. Expiratory and inspiratory wheezes heard. Administered 1000 doses of Metoprolol 12.5 mg dose, Tylenol, and Lisinopril around 0800, but no significant change. Notified RT to give scheduled breathing treatment. Called cardiology, and received an order for Metoprolol 2.5 mg IV. Will administer and continue to monitor closely.

## 2018-03-30 NOTE — Progress Notes (Addendum)
Progress Note  Patient Name: Christopher Butler Date of Encounter: 03/30/2018  Primary Cardiologist: Mertie Moores, MD  Subjective   CTSP this AM due to dyspnea and elevated HR's - afib 90's to 130's when he sits up or moves in bed.  Upon arrival, he is resting quietly.  He says dyspnea is coming and going.  Currently ok.  HR's 90's.  Inpatient Medications    Scheduled Meds: . aspirin EC  81 mg Oral Daily  . atorvastatin  10 mg Oral Daily  . furosemide  40 mg Intravenous Once  . levalbuterol  0.63 mg Nebulization Q6H  . levothyroxine  50 mcg Oral QAC breakfast  . lisinopril  5 mg Oral Daily  . metoprolol tartrate      . metoprolol tartrate  25 mg Oral BID  . nicotine  7 mg Transdermal Daily  . potassium chloride  20 mEq Oral BID  . tamsulosin  0.4 mg Oral QHS  . Warfarin - Pharmacist Dosing Inpatient   Does not apply q1800   Continuous Infusions: . sodium chloride 10 mL/hr at 03/28/18 1449  . amiodarone 30 mg/hr (03/30/18 0800)   PRN Meds: acetaminophen, guaiFENesin, nitroGLYCERIN, ondansetron (ZOFRAN) IV, traMADol   Vital Signs    Vitals:   03/30/18 0748 03/30/18 0758 03/30/18 0800 03/30/18 0831  BP:  (!) 138/99 (!) 138/99   Pulse:  (!) 120 (!) 119   Resp:   19   Temp: (!) 97.4 F (36.3 C)     TempSrc: Oral     SpO2:   98% 95%  Weight:      Height:        Intake/Output Summary (Last 24 hours) at 03/30/2018 0900 Last data filed at 03/30/2018 0808 Gross per 24 hour  Intake 623.29 ml  Output 565 ml  Net 58.29 ml   Filed Weights   03/28/18 1430 03/28/18 1756 03/30/18 0500  Weight: 64.9 kg 64.5 kg 66.6 kg    Physical Exam   GEN: Well nourished, well developed, in no acute distress.  HEENT: Grossly normal.  Neck: Supple, JVD to jaw, no carotid bruits, or masses. Cardiac: IR, IR, tachy, no murmurs, rubs, or gallops. No clubbing, cyanosis, edema.  Radials/DP/PT 1+ and equal bilaterally.  Respiratory:  Respirations regular and unlabored, coarse breath  sounds throughout bilat - a/p. GI: semi-firm, nondistended, BS + x 4. MS: no deformity or atrophy. Skin: warm and dry, no rash. Neuro:  Strength and sensation are intact. Psych: AAOx3.  Normal affect.  Labs    Chemistry Recent Labs  Lab 03/28/18 1434 03/28/18 1437 03/29/18 0509 03/29/18 1544  NA 133* 131* 131* 131*  K 3.5 3.5 2.9* 3.8  CL 101 99 101 103  CO2 16*  --  20* 21*  GLUCOSE 228* 226* 109* 133*  BUN 25* 28* 29* 25*  CREATININE 1.38* 1.10 1.04 1.08  CALCIUM 8.8*  --  8.4* 8.4*  PROT 6.7  --   --   --   ALBUMIN 3.5  --   --   --   AST 98*  --   --   --   ALT 68*  --   --   --   ALKPHOS 144*  --   --   --   BILITOT 1.4*  --   --   --   GFRNONAA 44*  --  >60 60*  GFRAA 51*  --  >60 >60  ANIONGAP 16*  --  10 7  Hematology Recent Labs  Lab 03/28/18 1434 03/28/18 1437 03/30/18 0632  WBC 12.0*  --  10.1  RBC 4.10*  --  3.59*  HGB 13.2 14.6 11.7*  HCT 40.6 43.0 34.9*  MCV 99.0  --  97.2  MCH 32.2  --  32.6  MCHC 32.5  --  33.5  RDW 12.3  --  12.5  PLT 210  --  188    Cardiac Enzymes Recent Labs  Lab 03/29/18 0509 03/29/18 1841 03/30/18 0015 03/30/18 0632  TROPONINI 3.72* 1.83* 1.13* 0.86*    Recent Labs  Lab 03/28/18 1435  TROPIPOC 0.23*    Radiology    Dg Chest Port 1 View  Result Date: 03/29/2018 CLINICAL DATA:  Wheezing EXAM: PORTABLE CHEST 1 VIEW COMPARISON:  March 28, 2018 FINDINGS: There is interstitial prominence in the bases, likely mild interstitial edema. There is no frank airspace consolidation. There is cardiomegaly with pulmonary vascularity within normal limits. There is aortic atherosclerosis. No adenopathy. No bone lesions. IMPRESSION: Cardiomegaly with suspected bibasilar interstitial edema. Lungs elsewhere clear. There is aortic atherosclerosis. Aortic Atherosclerosis (ICD10-I70.0). Electronically Signed   By: Lowella Grip III M.D.   On: 03/29/2018 09:30   Dg Chest Port 1 View  Result Date: 03/28/2018 CLINICAL  DATA:  Chest pain EXAM: PORTABLE CHEST 1 VIEW COMPARISON:  March 27, 2018 FINDINGS: The study is limited due to patient rotation. Cardiomegaly remains. There is a tortuous thoracic aorta which is similar in the interval. No change in the heart, hila, or mediastinum. No pneumothorax. No overt edema. Mild bibasilar atelectasis. No other acute abnormalities. IMPRESSION: 1. Limited study due to the low volume portable technique. Within these limitations, there appears to be mild bibasilar atelectasis and no other change. 2. The torturous thoracic aorta is stable radiographically. Electronically Signed   By: Dorise Bullion III M.D   On: 03/28/2018 14:55    Telemetry    Afib, 80's to 130's.  PVC's.  No further sustained VT  - Personally Reviewed  Cardiac Studies   2D Echocardiogram 11.29.2019  Study Conclusions  - Left ventricle: The cavity size was moderately dilated. Wall   thickness was normal. Systolic function was severely reduced. The   estimated ejection fraction was in the range of 20% to 25%.   Diffuse hypokinesis with inferolateral akinesis. The study is not   technically sufficient to allow evaluation of LV diastolic   function. - Mitral valve: Mildly thickened leaflets . There was moderate   regurgitation. - Left atrium: Massively dilated. - Tricuspid valve: There was mild regurgitation. - Pulmonary arteries: PA peak pressure: 46 mm Hg (S). - Systemic veins: Not visualized.  Impressions:  - Technically difficult study. LVEF 20-25%, severe global   hypokinesis with inferolateral akinesis, moderately dilated LV,   moderate MR, severe LAE, mild TR, RVSP 46 mmHg + RAP, no   pericardial effusion.  Patient Profile     84 y.o. male with a h/o MI & thrombolytics in 1987, HFrEF, permanent Afib, COPD, HTN, HL, and hypothyroidism admitted w/ sustained VT and NSTEMI.  Assessment & Plan    1.  MMVT:  Admitted 11/28 in the setting of VT and syncope x 2.  Seen by EP.  On IV amio  currently.  No recurrent VT overnight.  Echo 11/29 shows persistent LV dysfxn w/ EF of 20-25%.  Hypokalemic on admission.  3.8 this AM.  Aarish Rockers complete current bag of IV amio and then convert to oral - 400mg  BID.  Antario Yasuda titrate  blocker  dose to 25 BID.  Trop peaked @ 5.46 on 11/28 and has since trended down.  Suspect primary ischemia resulting in VT, though demand ischemia in setting of VT also a consideration.  2.  NSTEMI/CAD:  As above, trop peaked @ 5.46.  ? Demand ischemia vs primary event leading to ischemic VT.  He denies c/p but has been dyspneic and is volume overloaded on exam.  Destin Vinsant need to consider ischemic evaluation/cath if pt willing.  Creat stable.  Cont asa, statin,  blocker.  He is currently on coumadin w/ Rx INR @ 3.25.  If we plan to proceed w/ invasive eval, Menno Vanbergen need to hold coumadin and bridge in setting of ACS.  3.  Acute on chronic HFrEF: He has been intermittently dyspneic. Net + 90.9 ml for admission.  Wt up 2.1 kg since 11/28.  JVD and some abd distension noted today.  Lung sounds are very coarse in setting of COPD (interstitial edema note don cxr).  I'm adding lasix 40 IV BID today.  Follow renal fxn closely.  Cont  blocker and acei.  If he tolerates  blocker, can transition to metoprolol succinate tomorrow.  I Conchetta Lamia d/c lisinopril (received this AM) and switch to losartan beginning tomorrow, thus giving [place] the option of transitioning to entresto.  Mckenzye Cutright consider spiro pending renal fxn/lytes with diuresis.  W/ advanced age, he is a poor digoxin candidate.  4.  Permanent Afib:  Rates likely driving by underlying illness, COPD/resp failure/HFrEF.  Diurese as above.  Titrating  blocker today.  On amio now for VT.  INR supraRx this AM.  5.  CKD II:  Currently stable.  6.  Respiratory failure due to AECOPD and CHF:  Nebs ordered.  Coarse breath sounds.  Bular Hickok likely need steroids.  7.  Hypokalemia:  Better this AM.  Jakwon Gayton add KCl supp in setting of diuresis.  8.  Anxiety:  Danna Sewell  add back his home dose of ativan prn.  Signed, Murray Hodgkins, NP  03/30/2018, 9:00 AM    For questions or updates, please contact   Please consult [email or web address] for contact info under Cardiology/STEMI.  I have seen and examined this patient with Ignacia Bayley.  Agree with above, note added to reflect my findings.  On exam, iRRR, no murmurs, lungs with crackles and rales.  No further monomorphic VT seen on telemetry.  Wide-complex tachycardia is likely due to atrial fibrillation with a aberrancy.  He is short of breath, and has been volume overloaded since being in the hospital.  We Madelynne Lasker give him some Lasix.  Due to his rapid episodes of atrial fibrillation, we Shamiracle Gorden increase his beta-blockers.  His heart rate has come down to 70s and he is more comfortable.  Donelle Hise M. Dana Debo MD 03/30/2018 9:40 AM

## 2018-03-31 LAB — CBC
HCT: 37.2 % — ABNORMAL LOW (ref 39.0–52.0)
Hemoglobin: 12.5 g/dL — ABNORMAL LOW (ref 13.0–17.0)
MCH: 31.6 pg (ref 26.0–34.0)
MCHC: 33.6 g/dL (ref 30.0–36.0)
MCV: 94.2 fL (ref 80.0–100.0)
PLATELETS: 216 10*3/uL (ref 150–400)
RBC: 3.95 MIL/uL — ABNORMAL LOW (ref 4.22–5.81)
RDW: 12.2 % (ref 11.5–15.5)
WBC: 10.3 10*3/uL (ref 4.0–10.5)
nRBC: 0 % (ref 0.0–0.2)

## 2018-03-31 LAB — BASIC METABOLIC PANEL
Anion gap: 11 (ref 5–15)
BUN: 21 mg/dL (ref 8–23)
CO2: 21 mmol/L — ABNORMAL LOW (ref 22–32)
Calcium: 8.5 mg/dL — ABNORMAL LOW (ref 8.9–10.3)
Chloride: 100 mmol/L (ref 98–111)
Creatinine, Ser: 1.09 mg/dL (ref 0.61–1.24)
GFR calc Af Amer: >60 mL/min (ref >60)
GFR calc non Af Amer: 59 mL/min — ABNORMAL LOW (ref >60)
Glucose, Bld: 107 mg/dL — ABNORMAL HIGH (ref 70–99)
Potassium: 3.8 mmol/L (ref 3.5–5.1)
Sodium: 132 mmol/L — ABNORMAL LOW (ref 135–145)

## 2018-03-31 LAB — PROTIME-INR
INR: 3.6
Prothrombin Time: 35.3 seconds — ABNORMAL HIGH (ref 11.4–15.2)

## 2018-03-31 LAB — MAGNESIUM: Magnesium: 1.7 mg/dL (ref 1.7–2.4)

## 2018-03-31 MED ORDER — METOPROLOL TARTRATE 25 MG PO TABS: 25.0000 mg | ORAL_TABLET | Freq: Three times a day (TID) | ORAL | Status: DC

## 2018-03-31 MED ORDER — METOPROLOL TARTRATE 25 MG PO TABS
25.0000 mg | ORAL_TABLET | Freq: Four times a day (QID) | ORAL | Status: DC
  Administered 2018-03-31 – 2018-04-02 (×8): 25 mg via ORAL
  Filled 2018-03-31 (×8): qty 1

## 2018-03-31 MED ORDER — MAGNESIUM SULFATE 2 GM/50ML IV SOLN
2.0000 g | Freq: Once | INTRAVENOUS | Status: AC
  Administered 2018-03-31: 2 g via INTRAVENOUS
  Filled 2018-03-31: qty 50

## 2018-03-31 MED ORDER — SALINE SPRAY 0.65 % NA SOLN
1.0000 | NASAL | Status: DC | PRN
  Administered 2018-03-31 – 2018-04-01 (×2): 1 via NASAL
  Filled 2018-03-31: qty 44

## 2018-03-31 NOTE — Progress Notes (Signed)
ANTICOAGULATION CONSULT NOTE - Follow up [facility] for warfarin Indication: atrial fibrillation  Allergies  Allergen Reactions  . Ceclor [Cefaclor]   . Erythromycin     Patient Measurements: Height: 5\' 3"  (160 cm) Weight: 144 lb 6.4 oz (65.5 kg) IBW/kg (Calculated) : 56.9  Vital Signs: Temp: 96.5 F (35.8 C) (12/01 1211) Temp Source: Axillary (12/01 1211) BP: 124/90 (12/01 1000) Pulse Rate: 105 (12/01 1000)  Labs: Recent Labs    03/28/18 1434 03/28/18 1437  03/29/18 0509 03/29/18 1544 03/29/18 1841 03/30/18 0015 03/30/18 0632 03/31/18 0231  HGB 13.2 14.6  --   --   --   --   --  11.7* 12.5*  HCT 40.6 43.0  --   --   --   --   --  34.9* 37.2*  PLT 210  --   --   --   --   --   --  188 216  APTT 40*  --   --   --   --   --   --   --   --   LABPROT 26.0*  --   --  31.1*  --   --   --  32.6* 35.3*  INR 2.42  --   --  3.05  --   --   --  3.25 3.60  CREATININE 1.38* 1.10  --  1.04 1.08  --   --   --  1.09  TROPONINI 0.20*  --    < > 3.72*  --  1.83* 1.13* 0.86*  --    < > = values in this interval not displayed.    Estimated Creatinine Clearance: 35.5 mL/min (by C-G formula based on SCr of 1.09 mg/dL).   Medical History: Past Medical History:  Diagnosis Date  . Acute myocardial infarction 1987   s/p TPA  . CAD (coronary artery disease)    a. remote inferior wall myocardial infarction (status post streptokinase in 1987-did not require angioplasty).  . Cholelithiases   . Chronic systolic CHF (congestive heart failure) ([place])   . CKD (chronic kidney disease), stage II   . COPD (chronic obstructive pulmonary disease) ([place])   . Disc disease, degenerative, thoracic or thoracolumbar   . ED (erectile dysfunction)   . Emphysema   . HTN (hypertension)   . Hypercholesteremia   . Hypertensive heart disease with chronic systolic congestive heart failure ([place])   . Hyponatremia Sept 2010  . Hypothyroidism   . Permanent atrial fibrillation   . Sleep apnea    . Tobacco abuse   . Tricuspid regurgitation    Assessment: 17 yom presented with a possible STEMI. He is on chronic warfarin for history of afib. Home dose warfarin 2.5 mg daily xc 5 mg M/W/F.  INR remains supratherapeutic today at 3.6 s/p warfarin 1 mg x1. Possible double dose given on 11/28 (pt unsure if he took his warfarin that morning before presenting); however wouldn't expect too much of an effect on his INR as he was planned for 5 mg the next day and decreased dose given last night. Pt relatively therapeutic on home dose, been on for ~2 years. Hb stable, no signs/symptoms of bleeding. Noted, patient started on amiodarone, expect to see effects in upcoming weeks.   Goal of Therapy:  INR 2-3 Monitor platelets by anticoagulation protocol: Yes   Plan:  Hold warfarin tonight. Patient has large salad for lunch with spinach and mixed greens. Encouraged patient to eat entire salad, nurse and  family also encouraging.  Daily INR/CBC   Claiborne Billings, PharmD PGY2 Cardiology Pharmacy Resident Phone [phone] Please check AMION for all Pharmacist numbers by unit 03/31/2018 12:27 PM

## 2018-03-31 NOTE — Progress Notes (Signed)
Progress Note  Patient Name: Christopher Butler Date of Encounter: 03/31/2018  Primary Cardiologist: Mertie Moores, MD  Subjective   Overall feeling well.  He did have some shortness of breath earlier today which improved with Ativan.  No chest pain.  Inpatient Medications    Scheduled Meds: . amiodarone  400 mg Oral BID  . aspirin EC  81 mg Oral Daily  . atorvastatin  10 mg Oral Daily  . budesonide (PULMICORT) nebulizer solution  0.5 mg Nebulization BID  . feeding supplement (ENSURE ENLIVE)  237 mL Oral BID BM  . furosemide  40 mg Intravenous BID  . ipratropium  0.5 mg Nebulization Q6H  . levalbuterol  0.63 mg Nebulization Q6H  . levothyroxine  50 mcg Oral QAC breakfast  . losartan  25 mg Oral Daily  . metoprolol tartrate  25 mg Oral BID  . nicotine  7 mg Transdermal Daily  . potassium chloride  40 mEq Oral BID  . tamsulosin  0.4 mg Oral QHS  . Warfarin - Pharmacist Dosing Inpatient   Does not apply q1800   Continuous Infusions: . sodium chloride Stopped (03/30/18 1029)   PRN Meds: acetaminophen, guaiFENesin, LORazepam, menthol-cetylpyridinium, nitroGLYCERIN, ondansetron (ZOFRAN) IV, traMADol   Vital Signs    Vitals:   03/31/18 0500 03/31/18 0700 03/31/18 0743 03/31/18 0800  BP:    120/90  Pulse:  (!) 113  82  Resp:  (!) 25  (!) 27  Temp:   (!) 96.4 F (35.8 C)   TempSrc:   Axillary   SpO2:  90%  93%  Weight: 65.5 kg     Height:        Intake/Output Summary (Last 24 hours) at 03/31/2018 0833 Last data filed at 03/31/2018 0830 Gross per 24 hour  Intake 546.25 ml  Output 2740 ml  Net -2193.75 ml   Filed Weights   03/28/18 1756 03/30/18 0500 03/31/18 0500  Weight: 64.5 kg 66.6 kg 65.5 kg    Physical Exam   GEN: Well nourished, well developed, in no acute distress  HEENT: normal  Neck: no JVD, carotid bruits, or masses Cardiac: iRRR; no murmurs, rubs, or gallops,no edema  Respiratory:  clear to auscultation bilaterally, normal work of breathing GI:  soft, nontender, nondistended, + BS MS: no deformity or atrophy  Skin: warm and dry Neuro:  Strength and sensation are intact Psych: euthymic mood, full affect   Labs    Chemistry Recent Labs  Lab 03/28/18 1434  03/29/18 0509 03/29/18 1544 03/31/18 0231  NA 133*   < > 131* 131* 132*  K 3.5   < > 2.9* 3.8 3.8  CL 101   < > 101 103 100  CO2 16*  --  20* 21* 21*  GLUCOSE 228*   < > 109* 133* 107*  BUN 25*   < > 29* 25* 21  CREATININE 1.38*   < > 1.04 1.08 1.09  CALCIUM 8.8*  --  8.4* 8.4* 8.5*  PROT 6.7  --   --   --   --   ALBUMIN 3.5  --   --   --   --   AST 98*  --   --   --   --   ALT 68*  --   --   --   --   ALKPHOS 144*  --   --   --   --   BILITOT 1.4*  --   --   --   --  GFRNONAA 44*  --  >60 60* 59*  GFRAA 51*  --  >60 >60 >60  ANIONGAP 16*  --  10 7 11    < > = values in this interval not displayed.     Hematology Recent Labs  Lab 03/28/18 1434 03/28/18 1437 03/30/18 0632 03/31/18 0231  WBC 12.0*  --  10.1 10.3  RBC 4.10*  --  3.59* 3.95*  HGB 13.2 14.6 11.7* 12.5*  HCT 40.6 43.0 34.9* 37.2*  MCV 99.0  --  97.2 94.2  MCH 32.2  --  32.6 31.6  MCHC 32.5  --  33.5 33.6  RDW 12.3  --  12.5 12.2  PLT 210  --  188 216    Cardiac Enzymes Recent Labs  Lab 03/29/18 0509 03/29/18 1841 03/30/18 0015 03/30/18 0632  TROPONINI 3.72* 1.83* 1.13* 0.86*    Recent Labs  Lab 03/28/18 1435  TROPIPOC 0.23*    Radiology    Dg Chest Port 1 View  Result Date: 03/29/2018 CLINICAL DATA:  Wheezing EXAM: PORTABLE CHEST 1 VIEW COMPARISON:  March 28, 2018 FINDINGS: There is interstitial prominence in the bases, likely mild interstitial edema. There is no frank airspace consolidation. There is cardiomegaly with pulmonary vascularity within normal limits. There is aortic atherosclerosis. No adenopathy. No bone lesions. IMPRESSION: Cardiomegaly with suspected bibasilar interstitial edema. Lungs elsewhere clear. There is aortic atherosclerosis. Aortic Atherosclerosis  (ICD10-I70.0). Electronically Signed   By: Lowella Grip III M.D.   On: 03/29/2018 09:30    Telemetry    Atrial fibrillation- Personally Reviewed  Cardiac Studies   2D Echocardiogram 11.29.2019  Study Conclusions  - Left ventricle: The cavity size was moderately dilated. Wall   thickness was normal. Systolic function was severely reduced. The   estimated ejection fraction was in the range of 20% to 25%.   Diffuse hypokinesis with inferolateral akinesis. The study is not   technically sufficient to allow evaluation of LV diastolic   function. - Mitral valve: Mildly thickened leaflets . There was moderate   regurgitation. - Left atrium: Massively dilated. - Tricuspid valve: There was mild regurgitation. - Pulmonary arteries: PA peak pressure: 46 mm Hg (S). - Systemic veins: Not visualized.  Impressions:  - Technically difficult study. LVEF 20-25%, severe global   hypokinesis with inferolateral akinesis, moderately dilated LV,   moderate MR, severe LAE, mild TR, RVSP 46 mmHg + RAP, no   pericardial effusion.  Patient Profile     84 y.o. male with a h/o MI & thrombolytics in 1987, HFrEF, permanent Afib, COPD, HTN, HL, and hypothyroidism admitted w/ sustained VT and NSTEMI.  Assessment & Plan    1.  Monomorphic ventricular tachycardia: Currently on oral amiodarone.  We  continue at 400 mg twice a day.  He does have persistent LV dysfunction.  We  hold off on left heart catheterization unless further VT is seen.  2.  NSTEMI/CAD: Troponin peaked at 5.46.  It is unclear whether this is demand ischemia versus primary ischemia leading to VT, though I do favor the latter.  It was determined that he should have a conservative course as he likely has severe obstructive coronary disease.  Continue current management with aspirin, statin, beta-blocker, Coumadin.    3.  Acute on chronic HFrEF: Has diuresed net 2.1 L.  Current on Lasix 40 mg twice a day.   start losartan  today.    4.  Permanent Afib: Heart rate likely driving overall shortness of breath, though possibly related to COPD.  Plan to titrate beta-blockers.  Continue anticoagulation.  5.  CKD II: Currently stable  6.  AECOPD: Nebulizers ordered.  Per pulmonary.  7.  Hypokalemia: Improved with KCl supplementation.  8.  Anxiety: Ativan as needed  Signed,  Meredith Leeds, MD  03/31/2018, 8:33 AM    For questions or updates, please contact   Please consult [email or web address] for contact info under Cardiology/STEMI.

## 2018-04-01 DIAGNOSIS — I2510 Atherosclerotic heart disease of native coronary artery without angina pectoris: Secondary | ICD-10-CM

## 2018-04-01 DIAGNOSIS — R55 Syncope and collapse: Secondary | ICD-10-CM

## 2018-04-01 LAB — PROTIME-INR
INR: 2.72
PROTHROMBIN TIME: 28.4 s — AB (ref 11.4–15.2)

## 2018-04-01 LAB — CBC
HCT: 38.8 % — ABNORMAL LOW (ref 39.0–52.0)
Hemoglobin: 12.8 g/dL — ABNORMAL LOW (ref 13.0–17.0)
MCH: 31.3 pg (ref 26.0–34.0)
MCHC: 33 g/dL (ref 30.0–36.0)
MCV: 94.9 fL (ref 80.0–100.0)
Platelets: 267 10*3/uL (ref 150–400)
RBC: 4.09 MIL/uL — ABNORMAL LOW (ref 4.22–5.81)
RDW: 12.2 % (ref 11.5–15.5)
WBC: 13.4 10*3/uL — ABNORMAL HIGH (ref 4.0–10.5)
nRBC: 0 % (ref 0.0–0.2)

## 2018-04-01 MED ORDER — WARFARIN SODIUM 2.5 MG PO TABS
2.5000 mg | ORAL_TABLET | Freq: Once | ORAL | Status: AC
  Administered 2018-04-01: 2.5 mg via ORAL
  Filled 2018-04-01: qty 1

## 2018-04-01 MED ORDER — LEVALBUTEROL HCL 0.63 MG/3ML IN NEBU
0.6300 mg | INHALATION_SOLUTION | Freq: Four times a day (QID) | RESPIRATORY_TRACT | Status: DC | PRN
  Administered 2018-04-04: 0.63 mg via RESPIRATORY_TRACT
  Filled 2018-04-01 (×2): qty 3

## 2018-04-01 MED ORDER — IPRATROPIUM BROMIDE 0.02 % IN SOLN: 0.5000 mg | Freq: Four times a day (QID) | RESPIRATORY_TRACT | Status: DC | PRN

## 2018-04-01 NOTE — Progress Notes (Signed)
Pt continues to attempt to get OOB without assist. Pt states he knows he is to call for assist OOB but states he wants "to go back downstairs to see why they moved him", he also states he" needs to get up and help put his belongings away". Pt is oriented to person, place, and situation for being in the hospital, he is confused about why he was moved to a different room and is concerned about his family knowing where he is at. I assured patient his family is aware. I placed belongings in closet for patient. Will continue to monitor. Jessie Foot, RN

## 2018-04-01 NOTE — Evaluation (Signed)
Physical Therapy Evaluation Patient Details Name: Christopher Butler MRN: [id number] DOB: 17-Jun-1924 Today's Date: 04/01/2018   History of Present Illness  Patient is a 84 y/o male who presents with CP, SOB and syncope and found to have ventricular tachycardia with rates >200. PMH includes A-fib, HTN, COPD, CKd, CAD, MI.  Clinical Impression  Patient presents with generalized weakness, dizziness secondary to orthostatic hypotension, impaired balance, dyspnea on exertion and impaired mobility s/p above. Tolerated gait training with Min A for balance/safety. 2/4 DOE with HR 70s-90s A-fib. See orthostatic vitals below. Supine BP 120/77  HR78 bpm Sitting BP 94/80  HR 79 bpm dizziness reported Standing BP 115/73  HR 79 bpm Post walk BP 103/70  HR 75 bpm Pt lives at [place] with his wife who has dementia and reports independence with ADLs and ambulation PTA. Reports multiple syncopal episodes where pt wakes up on the floor. Was negotiating stairs to get to dining hall up until 2 weeks ago. Pt high fall risk at this time. Will likely benefit from RW for support. Would benefit from SNF to maximize independence and mobility prior to return home. If ALF able to provide more care, may be safe to d/c home with HHPT. Will follow.    Follow Up Recommendations SNF;Supervision for mobility/OOB    Equipment Recommendations  Other (comment)(TBA)    Recommendations for Other Services OT consult     Precautions / Restrictions Precautions Precautions: Fall Precaution Comments: watch HR; BP Restrictions Weight Bearing Restrictions: No      Mobility  Bed Mobility Overal bed mobility: Needs Assistance Bed Mobility: Supine to Sit     Supine to sit: Min guard;HOB elevated     General bed mobility comments: Use of rail, increased time to get to EOB. + dizziness.  Transfers Overall transfer level: Needs assistance Equipment used: 1 person hand held assist Transfers: Sit to/from Stand Sit  to Stand: Min assist         General transfer comment: Min A to steady in standing due to posterior lean. Transferred to chair post ambulation.  Ambulation/Gait Ambulation/Gait assistance: Min assist Gait Distance (Feet): 100 Feet Assistive device: 1 person hand held assist Gait Pattern/deviations: Step-through pattern;Decreased stride length;Narrow base of support;Trunk flexed;Shuffle Gait velocity: decreased   General Gait Details: Slow, unsteady gait wtih HHA (Min A) for balance. No dizziness reported. HR A-fib 70s-90s bpm. 2/4 DOE.  Stairs            Wheelchair Mobility    Modified Rankin (Stroke Patients Only)       Balance Overall balance assessment: Needs assistance;History of Falls Sitting-balance support: Feet supported;No upper extremity supported Sitting balance-Leahy Scale: Good     Standing balance support: During functional activity Standing balance-Leahy Scale: Poor Standing balance comment: Requires external support for standing.                              Pertinent Vitals/Pain Pain Assessment: No/denies pain    Home Living Family/patient expects to be discharged to:: Assisted living([facility]) Living Arrangements: Spouse/significant other(wife has dementia) Available Help at Discharge: Personal care attendant;Available PRN/intermittently Type of Home: Assisted living Home Access: Elevator       Home Equipment: Grab bars - tub/shower      Prior Function Level of Independence: Needs assistance   Gait / Transfers Assistance Needed: Walks independently, hx of falls. Walks to dining hall for meals.  ADL's / Homemaking Assistance Needed:  Does his own ADLs.        Hand Dominance   Dominant Hand: Right    Extremity/Trunk Assessment   Upper Extremity Assessment Upper Extremity Assessment: Defer to OT evaluation    Lower Extremity Assessment Lower Extremity Assessment: Generalized weakness    Cervical / Trunk  Assessment Cervical / Trunk Assessment: Kyphotic  Communication   Communication: No difficulties  Cognition Arousal/Alertness: Awake/alert Behavior During Therapy: WFL for tasks assessed/performed Overall Cognitive Status: No family/caregiver present to determine baseline cognitive functioning Area of Impairment: Memory;Orientation                 Orientation Level: Disoriented to;Situation   Memory: Decreased short-term memory         General Comments: A&Ox3, unsure of why he is here, "i kept falling at home."      General Comments      Exercises     Assessment/Plan    PT Assessment Patient needs continued PT services  PT Problem List Decreased strength;Decreased mobility;Decreased balance;Cardiopulmonary status limiting activity;Decreased cognition;Decreased activity tolerance       PT Treatment Interventions Functional mobility training;Balance training;Patient/family education;Gait training;Therapeutic activities;Therapeutic exercise;Stair training;DME instruction    PT Goals (Current goals can be found in the Care Plan section)  Acute Rehab PT Goals Patient Stated Goal: to get better PT Goal Formulation: With patient Time For Goal Achievement: 04/15/18 Potential to Achieve Goals: Good    Frequency Min 3X/week   Barriers to discharge Decreased caregiver support      Co-evaluation               AM-PAC PT "6 Clicks" Mobility  Outcome Measure Help needed turning from your back to your side while in a flat bed without using bedrails?: A Little Help needed moving from lying on your back to sitting on the side of a flat bed without using bedrails?: A Little Help needed moving to and from a bed to a chair (including a wheelchair)?: A Little Help needed standing up from a chair using your arms (e.g., wheelchair or bedside chair)?: A Little Help needed to walk in hospital room?: A Little Help needed climbing 3-5 steps with a railing? : A Lot 6 Click  Score: 17    End of Session Equipment Utilized During Treatment: Gait belt Activity Tolerance: Treatment limited secondary to medical complications (Comment)(orthostatic) Patient left: in chair;with call bell/phone within reach;with chair alarm set Nurse Communication: Mobility status;Other (comment)(Bp) PT Visit Diagnosis: Unsteadiness on feet (R26.81);History of falling (Z91.81);Difficulty in walking, not elsewhere classified (R26.2)    Time: 7353-2992 PT Time Calculation (min) (ACUTE ONLY): 26 min   Charges:   PT Evaluation $PT Eval Moderate Complexity: 1 Mod PT Treatments $Therapeutic Activity: 8-22 mins        Wray Kearns, PT, DPT Acute Rehabilitation Services Pager [phone] Office [phone]      Marguarite Arbour A Sabra Heck 04/01/2018, 3:45 PM

## 2018-04-01 NOTE — Progress Notes (Signed)
Progress Note  Patient Name: Christopher Butler Date of Encounter: 04/01/2018  Primary Cardiologist: Mertie Moores, MD   Subjective   Pt denies CP   Breathing is OK    Inpatient Medications    Scheduled Meds: . amiodarone  400 mg Oral BID  . aspirin EC  81 mg Oral Daily  . atorvastatin  10 mg Oral Daily  . budesonide (PULMICORT) nebulizer solution  0.5 mg Nebulization BID  . feeding supplement (ENSURE ENLIVE)  237 mL Oral BID BM  . furosemide  40 mg Intravenous BID  . ipratropium  0.5 mg Nebulization Q6H  . levalbuterol  0.63 mg Nebulization Q6H  . levothyroxine  50 mcg Oral QAC breakfast  . losartan  25 mg Oral Daily  . metoprolol tartrate  25 mg Oral Q6H  . nicotine  7 mg Transdermal Daily  . potassium chloride  40 mEq Oral BID  . tamsulosin  0.4 mg Oral QHS  . Warfarin - Pharmacist Dosing Inpatient   Does not apply q1800   Continuous Infusions:  PRN Meds: acetaminophen, guaiFENesin, LORazepam, menthol-cetylpyridinium, nitroGLYCERIN, ondansetron (ZOFRAN) IV, sodium chloride, traMADol   Vital Signs    Vitals:   03/31/18 2031 03/31/18 2035 03/31/18 2042 04/01/18 0341  BP:    (!) 142/85  Pulse:    79  Resp:    (!) 22  Temp:      TempSrc:      SpO2: 95% 94% 90% 92%  Weight:    63.5 kg  Height:        Intake/Output Summary (Last 24 hours) at 04/01/2018 0829 Last data filed at 03/31/2018 2200 Gross per 24 hour  Intake 277.4 ml  Output 1760 ml  Net -1482.6 ml   Filed Weights   03/30/18 0500 03/31/18 0500 04/01/18 0341  Weight: 66.6 kg 65.5 kg 63.5 kg    Telemetry    Afib   Rate 80s   - Personally Reviewed  ECG    Not done  - Personally Reviewed  Physical Exam   GEN: No acute distress.   Neck: No JVD Cardiac: Irreg rate / rhythm  , no murmurs, rubs, or gallops.  Respiratory: Clear to auscultation bilaterally. GI: Soft, nontender, non-distended  MS: No edema; No deformity. Neuro:  Nonfocal  Psych: Normal affect   Labs    Chemistry Recent Labs    Lab 03/28/18 1434  03/29/18 0509 03/29/18 1544 03/31/18 0231  NA 133*   < > 131* 131* 132*  K 3.5   < > 2.9* 3.8 3.8  CL 101   < > 101 103 100  CO2 16*  --  20* 21* 21*  GLUCOSE 228*   < > 109* 133* 107*  BUN 25*   < > 29* 25* 21  CREATININE 1.38*   < > 1.04 1.08 1.09  CALCIUM 8.8*  --  8.4* 8.4* 8.5*  PROT 6.7  --   --   --   --   ALBUMIN 3.5  --   --   --   --   AST 98*  --   --   --   --   ALT 68*  --   --   --   --   ALKPHOS 144*  --   --   --   --   BILITOT 1.4*  --   --   --   --   GFRNONAA 44*  --  >60 60* 59*  GFRAA 51*  --  >60 >  60 >60  ANIONGAP 16*  --  10 7 11    < > = values in this interval not displayed.     Hematology Recent Labs  Lab 03/30/18 0632 03/31/18 0231 04/01/18 0359  WBC 10.1 10.3 13.4*  RBC 3.59* 3.95* 4.09*  HGB 11.7* 12.5* 12.8*  HCT 34.9* 37.2* 38.8*  MCV 97.2 94.2 94.9  MCH 32.6 31.6 31.3  MCHC 33.5 33.6 33.0  RDW 12.5 12.2 12.2  PLT 188 216 267    Cardiac Enzymes Recent Labs  Lab 03/29/18 0509 03/29/18 1841 03/30/18 0015 03/30/18 0632  TROPONINI 3.72* 1.83* 1.13* 0.86*    Recent Labs  Lab 03/28/18 1435  TROPIPOC 0.23*     BNPNo results for input(s): BNP, PROBNP in the last 168 hours.   DDimer No results for input(s): DDIMER in the last 168 hours.   Radiology    No results found.  Cardiac Studies    Patient Profile     84 y.o. male  h/o MI & thrombolytics in 1987, HFrEF, permanent Afib, COPD, HTN, HL, and hypothyroidism admitted w/ sustained VT and NSTEMI  Assessment & Plan   1  VT   Monomorphic   On amiodarone  No recurrance   Continue bid here in hosp  WIll go to qd on d/c   Repeat EKG    2  Syncop   Prob related to VT    On amio  Will check orthostatics and have PT evaluate   2  CAD   Peak troponin 5.46  Probable CAD Denies CP    Plan for medical Rx given age   58    Afib   Permanent  Rates controlled   COntinue amio and anticoagulation    5  Hypokalemia  K 3.8     6  Tob abuse   Pt smokes 1/2 ppd     Will get nicotine patch  PT to see today    Pt lives in [place]   Wife has dementia  For questions or updates, please contact [facility] Please consult [email or web address] for contact info under        Signed, Dorris Carnes, MD  04/01/2018, 8:29 AM

## 2018-04-01 NOTE — Progress Notes (Signed)
ANTICOAGULATION CONSULT NOTE - Follow up [facility] for warfarin Indication: atrial fibrillation  Allergies  Allergen Reactions  . Ceclor [Cefaclor]   . Erythromycin     Patient Measurements: Height: 5\' 3"  (160 cm) Weight: 139 lb 15.9 oz (63.5 kg) IBW/kg (Calculated) : 56.9  Vital Signs: BP: 142/85 (12/02 0341) Pulse Rate: 79 (12/02 0341)  Labs: Recent Labs    03/29/18 1544 03/29/18 1841 03/30/18 0015  03/30/18 0632 03/31/18 0231 04/01/18 0359  HGB  --   --   --    < > 11.7* 12.5* 12.8*  HCT  --   --   --   --  34.9* 37.2* 38.8*  PLT  --   --   --   --  188 216 267  LABPROT  --   --   --   --  32.6* 35.3* 28.4*  INR  --   --   --   --  3.25 3.60 2.72  CREATININE 1.08  --   --   --   --  1.09  --   TROPONINI  --  1.83* 1.13*  --  0.86*  --   --    < > = values in this interval not displayed.    Estimated Creatinine Clearance: 35.5 mL/min (by C-G formula based on SCr of 1.09 mg/dL).   Medical History: Past Medical History:  Diagnosis Date  . Acute myocardial infarction 1987   s/p TPA  . CAD (coronary artery disease)    a. remote inferior wall myocardial infarction (status post streptokinase in 1987-did not require angioplasty).  . Cholelithiases   . Chronic systolic CHF (congestive heart failure) ([place])   . CKD (chronic kidney disease), stage II   . COPD (chronic obstructive pulmonary disease) ([place])   . Disc disease, degenerative, thoracic or thoracolumbar   . ED (erectile dysfunction)   . Emphysema   . HTN (hypertension)   . Hypercholesteremia   . Hypertensive heart disease with chronic systolic congestive heart failure ([place])   . Hyponatremia Sept 2010  . Hypothyroidism   . Permanent atrial fibrillation   . Sleep apnea   . Tobacco abuse   . Tricuspid regurgitation    Assessment: 77 yom presented with possible STEMI. He is on chronic warfarin for history of afib. Home dose warfarin 2.5 mg daily except 5 mg M/W/F.  INR back in  therapeutic range today 3.6>2.72 after holding warfarin last night and reduced doses the previous 2 nights. Pt was instructed to eat a large salad for lunch yesterday. CBC stable, no signs/symptoms of bleeding documented. Noted, patient started on amiodarone, expect to see effects on INR in upcoming weeks.  Goal of Therapy:  INR 2-3 Monitor platelets by anticoagulation protocol: Yes   Plan:  Warfarin 2.5mg  PO x 1 tonight Monitor daily INR, CBC, s/sx bleeding, DDI with amio May need to further reduce dose if INR trends up again  Elicia Lamp, PharmD, BCPS Clinical Pharmacist Clinical phone [phone] Please check AMION for all [facility] contact numbers 04/01/2018 9:25 AM

## 2018-04-01 NOTE — Progress Notes (Signed)
PT Cancellation Note  Patient Details Name: Christopher Butler MRN: [id number] DOB: May 23, 1924   Cancelled Treatment:    Reason Eval/Treat Not Completed: Active bedrest order Pt with bedrest order. Please increase activity orders prior to PT evaluation. Will follow.   Marguarite Arbour A Hays Dunnigan 04/01/2018, 7:27 AM Wray Kearns, PT, DPT Acute Rehabilitation Services Pager [phone] Office [phone]

## 2018-04-01 NOTE — Progress Notes (Signed)
  Was paged by RN regarding orthostatic hypotension and guidance regarding metoprolol that is ordered Q6H. Metoprolol ordered for VT. He was admitted with sustained VT and NSTEMI. Meds reviewed. He is also on Losartan and Flomax. He is dizzy when he stands but otherwise asymptomatic. Given his VT, recommend that he continue metoprolol as scheduled for now. Can hold Flomax tonight and defer further managment to rounding team in the AM. May consider scaling back on Losartan dose tomorrow or consider addition of midodrine and compression stockings to help with orthostatic hypotension. Would advised bed rest and pt assistance when getting out of bed and ambulating to reduce risk of falls. I discussed plan with RN.   Lyda Jester, PA-C 04/01/2018

## 2018-04-02 LAB — PROTIME-INR
INR: 2.48
Prothrombin Time: 26.5 seconds — ABNORMAL HIGH (ref 11.4–15.2)

## 2018-04-02 LAB — CBC
HCT: 37.6 % — ABNORMAL LOW (ref 39.0–52.0)
Hemoglobin: 12.5 g/dL — ABNORMAL LOW (ref 13.0–17.0)
MCH: 31.5 pg (ref 26.0–34.0)
MCHC: 33.2 g/dL (ref 30.0–36.0)
MCV: 94.7 fL (ref 80.0–100.0)
PLATELETS: 291 10*3/uL (ref 150–400)
RBC: 3.97 MIL/uL — ABNORMAL LOW (ref 4.22–5.81)
RDW: 12.2 % (ref 11.5–15.5)
WBC: 11.9 10*3/uL — AB (ref 4.0–10.5)
nRBC: 0 % (ref 0.0–0.2)

## 2018-04-02 MED ORDER — WARFARIN SODIUM 2.5 MG PO TABS
2.5000 mg | ORAL_TABLET | Freq: Once | ORAL | Status: AC
  Administered 2018-04-02: 2.5 mg via ORAL
  Filled 2018-04-02: qty 1

## 2018-04-02 MED ORDER — MAGNESIUM HYDROXIDE 400 MG/5ML PO SUSP
30.0000 mL | Freq: Every day | ORAL | Status: DC | PRN
  Administered 2018-04-03: 30 mL via ORAL
  Filled 2018-04-02 (×2): qty 30

## 2018-04-02 MED ORDER — METOPROLOL TARTRATE 25 MG PO TABS
25.0000 mg | ORAL_TABLET | Freq: Two times a day (BID) | ORAL | Status: DC
  Administered 2018-04-02 – 2018-04-05 (×6): 25 mg via ORAL
  Filled 2018-04-02 (×6): qty 1

## 2018-04-02 NOTE — NC FL2 (Signed)
[place] LEVEL OF CARE SCREENING TOOL     IDENTIFICATION  Patient Name: Christopher Butler Birthdate: 04-May-1924 Sex: male Admission Date (Current Location): 03/28/2018  [facility] and [place] Number:  Herbalist and Address:  [facility] [place], [place] [address], [place], [place] [address]      Provider Number: [id number]  Attending Physician Name and Address:  Sanda Klein, MD  Relative Name and Phone Number:  Jabari Swoveland [phone]    Current Level of Care: Hospital Recommended Level of Care: [place] Prior Approval Number:    Date Approved/Denied:   PASRR Number: [id number]  Discharge Plan: SNF    Current Diagnoses: Patient Active Problem List   Diagnosis Date Noted  . Ventricular tachycardia ([place]) 03/28/2018  . Chronic systolic CHF (congestive heart failure) ([place]) 09/20/2016  . Coronary artery disease involving native coronary artery of native heart without angina pectoris 03/27/2016  . Herpes zoster 12/17/2013  . Encounter for therapeutic drug monitoring 05/30/2013  . Tinnitus 12/20/2012  . Hyposmolality and/or hyponatremia 12/20/2012  . Tinea corporis 04/17/2012  . Permanent atrial fibrillation 10/24/2011  . Ischemic heart disease 08/03/2010  . Tobacco abuse 08/03/2010  . Hypertensive heart disease with CHF (congestive heart failure) ([place]) 08/03/2010  . Hypercholesterolemia 08/03/2010  . Erectile dysfunction 08/03/2010  . Low back pain 08/03/2010  . Cholelithiasis 08/03/2010    Orientation RESPIRATION BLADDER Height & Weight     Self, Time, Situation, Place  O2(Nasal canula 2L/min) Incontinent, External catheter Weight: 63.4 kg Height:  5\' 3"  (160 cm)  BEHAVIORAL SYMPTOMS/MOOD NEUROLOGICAL BOWEL NUTRITION STATUS      Continent Diet  AMBULATORY STATUS COMMUNICATION OF NEEDS Skin   Extensive Assist Verbally Normal                       Personal Care Assistance Level of Assistance  Bathing,  Feeding, Dressing Bathing Assistance: Maximum assistance Feeding assistance: Independent Dressing Assistance: Maximum assistance     Functional Limitations Info  Hearing, Sight, Speech   Hearing Info: Impaired Speech Info: Adequate    SPECIAL CARE FACTORS FREQUENCY  PT (By licensed PT)     PT Frequency: 5x/weekly              Contractures Contractures Info: Present    Additional Factors Info  Code Status, Allergies Code Status Info: Full Allergies Info: Ceclor, Erythoromycin           Current Medications (04/02/2018):  This is the current hospital active medication list Current Facility-Administered Medications  Medication Dose Route Frequency Provider Last Rate Last Dose  . acetaminophen (TYLENOL) tablet 650 mg  650 mg Oral Q4H PRN Theora Gianotti, NP   650 mg at 03/30/18 1643  . amiodarone (PACERONE) tablet 400 mg  400 mg Oral BID Theora Gianotti, NP   400 mg at 04/02/18 0946  . aspirin EC tablet 81 mg  81 mg Oral Daily Theora Gianotti, NP   81 mg at 04/02/18 0946  . atorvastatin (LIPITOR) tablet 10 mg  10 mg Oral Daily Theora Gianotti, NP   10 mg at 04/02/18 0946  . budesonide (PULMICORT) nebulizer solution 0.5 mg  0.5 mg Nebulization BID Theora Gianotti, NP   0.5 mg at 04/02/18 0850  . feeding supplement (ENSURE ENLIVE) (ENSURE ENLIVE) liquid 237 mL  237 mL Oral BID BM Theora Gianotti, NP   237 mL at 04/01/18 1506  . furosemide (LASIX) injection 40  mg  40 mg Intravenous BID Theora Gianotti, NP   40 mg at 04/02/18 0938  . guaiFENesin (ROBITUSSIN) 100 MG/5ML solution 100 mg  5 mL Oral Q4H PRN Theora Gianotti, NP   100 mg at 03/30/18 1643  . ipratropium (ATROVENT) nebulizer solution 0.5 mg  0.5 mg Nebulization Q6H PRN Croitoru, Mihai, MD      . levalbuterol (XOPENEX) nebulizer solution 0.63 mg  0.63 mg Nebulization Q6H PRN Croitoru, Mihai, MD      . levothyroxine (SYNTHROID, LEVOTHROID)  tablet 50 mcg  50 mcg Oral QAC breakfast Theora Gianotti, NP   50 mcg at 04/02/18 0754  . LORazepam (ATIVAN) tablet 0.5 mg  0.5 mg Oral Q8H PRN Theora Gianotti, NP   0.5 mg at 04/02/18 0506  . menthol-cetylpyridinium (CEPACOL) lozenge 3 mg  1 lozenge Oral PRN Theora Gianotti, NP      . metoprolol tartrate (LOPRESSOR) tablet 25 mg  25 mg Oral BID Fay Records, MD      . nicotine (NICODERM CQ - dosed in mg/24 hr) patch 7 mg  7 mg Transdermal Daily Theora Gianotti, NP   7 mg at 04/01/18 1213  . nitroGLYCERIN (NITROSTAT) SL tablet 0.4 mg  0.4 mg Sublingual Q5 min PRN Theora Gianotti, NP      . ondansetron [facility]) injection 4 mg  4 mg Intravenous Q6H PRN Theora Gianotti, NP      . potassium chloride SA (K-DUR,KLOR-CON) CR tablet 40 mEq  40 mEq Oral BID Theora Gianotti, NP   40 mEq at 04/02/18 0946  . sodium chloride (OCEAN) 0.65 % nasal spray 1 spray  1 spray Each Nare PRN Theora Gianotti, NP   1 spray at 04/01/18 0813  . tamsulosin (FLOMAX) capsule 0.4 mg  0.4 mg Oral QHS Theora Gianotti, NP   0.4 mg at 03/31/18 2057  . traMADol (ULTRAM) tablet 50 mg  50 mg Oral BID PRN Theora Gianotti, NP   50 mg at 03/30/18 1719  . Warfarin - Pharmacist Dosing Inpatient   Does not apply q1800 Theora Gianotti, NP         Discharge Medications: Please see discharge summary for a list of discharge medications.  Relevant Imaging Results:  Relevant Lab Results:   Additional Information SSN- [id number]  Estanislado Emms, LCSW

## 2018-04-02 NOTE — Progress Notes (Signed)
Patient states "It feels a little hard to breath", Oxygen sats 91-92% RA, placed on 2L Christopher Butler. Patient coughing small amount of white phlegm.  Lungs CTA.  Given morning lasix 40mg  IV. Will monitor breathing.

## 2018-04-02 NOTE — Progress Notes (Signed)
Patient's family chose [facility] and [place]. [place] started [employer].   Arlis Porta, Social Work Ship broker

## 2018-04-02 NOTE — Progress Notes (Signed)
ANTICOAGULATION CONSULT NOTE - Follow up [place] for warfarin Indication: atrial fibrillation  Allergies  Allergen Reactions  . Ceclor [Cefaclor]   . Erythromycin     Patient Measurements: Height: 5\' 3"  (160 cm) Weight: 139 lb 12.8 oz (63.4 kg) IBW/kg (Calculated) : 56.9  Vital Signs: Temp: 97.6 F (36.4 C) (12/03 0430) Temp Source: Oral (12/03 0430) BP: 124/76 (12/03 0900) Pulse Rate: 63 (12/03 0754)  Labs: Recent Labs    03/31/18 0231 04/01/18 0359 04/02/18 0426  HGB 12.5* 12.8* 12.5*  HCT 37.2* 38.8* 37.6*  PLT 216 267 291  LABPROT 35.3* 28.4* 26.5*  INR 3.60 2.72 2.48  CREATININE 1.09  --   --     Estimated Creatinine Clearance: 35.5 mL/min (by C-G formula based on SCr of 1.09 mg/dL).   Medical History: Past Medical History:  Diagnosis Date  . Acute myocardial infarction 1987   s/p TPA  . CAD (coronary artery disease)    a. remote inferior wall myocardial infarction (status post streptokinase in 1987-did not require angioplasty).  . Cholelithiases   . Chronic systolic CHF (congestive heart failure) ([place])   . CKD (chronic kidney disease), stage II   . COPD (chronic obstructive pulmonary disease) ([place])   . Disc disease, degenerative, thoracic or thoracolumbar   . ED (erectile dysfunction)   . Emphysema   . HTN (hypertension)   . Hypercholesteremia   . Hypertensive heart disease with chronic systolic congestive heart failure ([place])   . Hyponatremia Sept 2010  . Hypothyroidism   . Permanent atrial fibrillation   . Sleep apnea   . Tobacco abuse   . Tricuspid regurgitation    Assessment: 13 yom presented with possible STEMI. He is on chronic warfarin for history of afib. Home dose warfarin 2.5 mg daily except 5 mg M/W/F.  INR remains therapeutic at 2.43. Warfarin resumed last night after holding warfarin 12/1 and reduced doses the previous 2 nights. CBC stable, no signs/symptoms of bleeding documented. Noted, patient started on  amiodarone, expect to see effects on INR in upcoming weeks.  Goal of Therapy:  INR 2-3 Monitor platelets by anticoagulation protocol: Yes   Plan:  Warfarin 2.5mg  PO x 1 tonight Monitor daily INR, CBC, s/sx bleeding, DDI with amio May need to further reduce dose if INR trends up again  Elicia Lamp, PharmD, BCPS Clinical Pharmacist Clinical phone [phone] Please check AMION for all [place] contact numbers 04/02/2018 10:37 AM

## 2018-04-02 NOTE — Progress Notes (Signed)
Progress Note  Patient Name: Christopher Butler Date of Encounter: 04/02/2018  Primary Cardiologist: Mertie Moores, MD   Subjective   Pt a little dizzy yesteday with sitting   Not now in bed  No CP   Breathing is OK    Inpatient Medications    Scheduled Meds: . amiodarone  400 mg Oral BID  . aspirin EC  81 mg Oral Daily  . atorvastatin  10 mg Oral Daily  . budesonide (PULMICORT) nebulizer solution  0.5 mg Nebulization BID  . feeding supplement (ENSURE ENLIVE)  237 mL Oral BID BM  . furosemide  40 mg Intravenous BID  . levothyroxine  50 mcg Oral QAC breakfast  . losartan  25 mg Oral Daily  . metoprolol tartrate  25 mg Oral Q6H  . nicotine  7 mg Transdermal Daily  . potassium chloride  40 mEq Oral BID  . tamsulosin  0.4 mg Oral QHS  . Warfarin - Pharmacist Dosing Inpatient   Does not apply q1800   Continuous Infusions:  PRN Meds: acetaminophen, guaiFENesin, ipratropium, levalbuterol, LORazepam, menthol-cetylpyridinium, nitroGLYCERIN, ondansetron (ZOFRAN) IV, sodium chloride, traMADol   Vital Signs    Vitals:   04/01/18 2106 04/02/18 0427 04/02/18 0430 04/02/18 0754  BP: 108/85  121/77 132/86  Pulse: 70  69 63  Resp: 18   19  Temp: 97.9 F (36.6 C)  97.6 F (36.4 C)   TempSrc: Oral  Oral   SpO2: 92%  93% 92%  Weight:  63.4 kg    Height:        Intake/Output Summary (Last 24 hours) at 04/02/2018 0818 Last data filed at 04/02/2018 0800 Gross per 24 hour  Intake 920 ml  Output 1750 ml  Net -830 ml   Filed Weights   03/31/18 0500 04/01/18 0341 04/02/18 0427  Weight: 65.5 kg 63.5 kg 63.4 kg    Telemetry    Afib   Rate 70s to 90s    - Personally Reviewed  ECG    Not done  - Personally Reviewed  Physical Exam   GEN: No acute distress.   Neck: No JVD Cardiac: Irreg rate / rhythm  , no murmurs, rubs, or gallops.  Respiratory: Clear to auscultation bilaterally. GI: Soft, nontender, non-distended  MS: No signif  edema; No deformity. Neuro:  Nonfocal    Psych: Normal affect   Labs    Chemistry Recent Labs  Lab 03/28/18 1434  03/29/18 0509 03/29/18 1544 03/31/18 0231  NA 133*   < > 131* 131* 132*  K 3.5   < > 2.9* 3.8 3.8  CL 101   < > 101 103 100  CO2 16*  --  20* 21* 21*  GLUCOSE 228*   < > 109* 133* 107*  BUN 25*   < > 29* 25* 21  CREATININE 1.38*   < > 1.04 1.08 1.09  CALCIUM 8.8*  --  8.4* 8.4* 8.5*  PROT 6.7  --   --   --   --   ALBUMIN 3.5  --   --   --   --   AST 98*  --   --   --   --   ALT 68*  --   --   --   --   ALKPHOS 144*  --   --   --   --   BILITOT 1.4*  --   --   --   --   GFRNONAA 44*  --  >60 60*  59*  GFRAA 51*  --  >60 >60 >60  ANIONGAP 16*  --  10 7 11    < > = values in this interval not displayed.     Hematology Recent Labs  Lab 03/31/18 0231 04/01/18 0359 04/02/18 0426  WBC 10.3 13.4* 11.9*  RBC 3.95* 4.09* 3.97*  HGB 12.5* 12.8* 12.5*  HCT 37.2* 38.8* 37.6*  MCV 94.2 94.9 94.7  MCH 31.6 31.3 31.5  MCHC 33.6 33.0 33.2  RDW 12.2 12.2 12.2  PLT 216 267 291    Cardiac Enzymes Recent Labs  Lab 03/29/18 0509 03/29/18 1841 03/30/18 0015 03/30/18 0632  TROPONINI 3.72* 1.83* 1.13* 0.86*    Recent Labs  Lab 03/28/18 1435  TROPIPOC 0.23*     BNPNo results for input(s): BNP, PROBNP in the last 168 hours.   DDimer No results for input(s): DDIMER in the last 168 hours.   Radiology    No results found.  Cardiac Studies    Patient Profile     84 y.o. male  h/o MI & thrombolytics in 1987, HFrEF, permanent Afib, COPD, HTN, HL, and hypothyroidism admitted w/ sustained VT and NSTEMI  Assessment & Plan   1  VT   Monomorphic   On amiodarone  No VT since   Continue bid here in hosp  WIll go to qd on d/c     2  Syncop   Prob related to VT  BP did drop transiently (about 30 pts) with sitting yesterday   Improved with standing    On amio   I would cut back on BP meds before adding another agent   Follow    2  CAD   Peak troponin 5.46  Probable CAD Denies CP    Plan for medical Rx  given age   67    Afib   Permanent  Rates controlled   COntinue amio and anticoagulation    4   PT   Pt has evaluated    Recom SNF unless he can get enough Home health at [place] assist   Need to review with case managere    Tob abuse   Pt smokes 1/2 ppd    Will get nicotine patch   For questions or updates, please contact [facility] Please consult [email or web address] for contact info under        Signed, Dorris Carnes, MD  04/02/2018, 8:18 AM

## 2018-04-02 NOTE — Clinical Social Work Note (Signed)
Clinical Social Work Assessment  Patient Details  Name: Christopher Butler MRN: [id number] Date of Birth: [birth year]  Date of referral:  04/02/18               Reason for consult:  Discharge Planning, Facility Placement                Permission sought to share information with:  Facility Sport and exercise psychologist, Family Supports Permission granted to share information::  Yes, Verbal Permission Granted  Name::     Luna Glasgow, daughter  Agency::  [facility]  Relationship::  Daughter  Contact Information:  [phone]  Housing/Transportation Living arrangements for the past 2 months:  [place] of Information:  Patient Patient Interpreter Needed:    Criminal Activity/Legal Involvement Pertinent to Current Situation/Hospitalization:  No - Comment as needed Significant Relationships:  Adult Children, Spouse Lives with:  Spouse Do you feel safe going back to the place where you live?  Yes Need for family participation in patient care:  Yes (Comment)  Care giving concerns: Patient from [facility], where he is the primary care taker of his wife with dementia.     Social Worker assessment / plan:  SW Intern and CSW met with patient at bedside. Patient was alert and oriented x4. SW Intern and CSW Introduced self and role, and discussed discharge plan. Patient states that he comes from [place], an independent living facility, where he is the primary care taker for his wife who has dementia. Patient explains that before coming in to the hospital he did not need any assistance. SW Intern spoke with patient about physical therapy's recommendation of SNF, and patient was agreeable. However, patient stated that it "depends on where it is," as he would like a facility close to [place]. Patient stated that he has never been to rehab, and CSW explained that he would have to stay overnight and would follow a treatment plan developed at  SNF. Patient stated he understands, and hopes to be able to return back to [place] to care for his wife after rehab. CSW/SW Intern agreed to send out referrals to [facility] facilities, provide him with a list, and follow-up for choice so that facility can start [place] pre-authorization process. [employer] authorization is required before patient admits to SNF.   Employment status:  Retired Medical sales representative) PT Recommendations:  [place] / Referral to community resources:  [place]  Patient/Family's Response to care: Patient appreciative of the care.  Patient/Family's Understanding of and Emotional Response to Diagnosis, Current Treatment, and Prognosis: Patient with good understanding of current treatment, and hopes to return to baseline after rehab.   Emotional Assessment Appearance:  Appears stated age Attitude/Demeanor/Rapport:  Engaged Affect (typically observed):  Accepting, Appropriate, Hopeful Orientation:  Oriented to Self, Oriented to Situation, Oriented to Place, Oriented to  Time Alcohol / Substance use:  Not Applicable Psych involvement (Current and /or in the community):  No (Comment)  Discharge Needs  Concerns to be addressed:  Discharge Planning Concerns, Care Coordination Readmission within the last 30 days:  No Current discharge risk:  None Barriers to Discharge:  Continued Medical Work up, [place], [place] Work 04/02/2018, 10:56 AM

## 2018-04-03 LAB — CBC
HCT: 41.1 % (ref 39.0–52.0)
Hemoglobin: 13.2 g/dL (ref 13.0–17.0)
MCH: 30.8 pg (ref 26.0–34.0)
MCHC: 32.1 g/dL (ref 30.0–36.0)
MCV: 96 fL (ref 80.0–100.0)
Platelets: 337 10*3/uL (ref 150–400)
RBC: 4.28 MIL/uL (ref 4.22–5.81)
RDW: 12.4 % (ref 11.5–15.5)
WBC: 13.4 10*3/uL — ABNORMAL HIGH (ref 4.0–10.5)
nRBC: 0 % (ref 0.0–0.2)

## 2018-04-03 LAB — BASIC METABOLIC PANEL
Anion gap: 13 (ref 5–15)
BUN: 40 mg/dL — AB (ref 8–23)
CO2: 24 mmol/L (ref 22–32)
Calcium: 8.9 mg/dL (ref 8.9–10.3)
Chloride: 97 mmol/L — ABNORMAL LOW (ref 98–111)
Creatinine, Ser: 1.65 mg/dL — ABNORMAL HIGH (ref 0.61–1.24)
GFR calc Af Amer: 41 mL/min — ABNORMAL LOW (ref >60)
GFR calc non Af Amer: 36 mL/min — ABNORMAL LOW (ref >60)
Glucose, Bld: 99 mg/dL (ref 70–99)
Potassium: 4.5 mmol/L (ref 3.5–5.1)
Sodium: 134 mmol/L — ABNORMAL LOW (ref 135–145)

## 2018-04-03 LAB — PROTIME-INR
INR: 2.44
Prothrombin Time: 26.1 seconds — ABNORMAL HIGH (ref 11.4–15.2)

## 2018-04-03 MED ORDER — WARFARIN SODIUM 2.5 MG PO TABS
2.5000 mg | ORAL_TABLET | Freq: Once | ORAL | Status: AC
  Administered 2018-04-03: 2.5 mg via ORAL
  Filled 2018-04-03 (×2): qty 1

## 2018-04-03 MED ORDER — WARFARIN SODIUM 5 MG PO TABS: 5.0000 mg | ORAL_TABLET | Freq: Once | ORAL | Status: DC

## 2018-04-03 NOTE — Progress Notes (Signed)
Physical Therapy Treatment Patient Details Name: Christopher Butler MRN: [id number] DOB: 08-01-1924 Today's Date: 04/03/2018    History of Present Illness Patient is a 84 y/o male who presents with CP, SOB and syncope and found to have ventricular tachycardia with rates >200. PMH includes A-fib, HTN, COPD, CKd, CAD, MI.    PT Comments    Pt admitted with above diagnosis. Pt currently with functional limitations due to the deficits listed below (see PT Problem List). Pt was able to ambulate with RW with min guard assist and cues.  Desat as below with activity on RA therefore needed 2LO2.  Will continue to progress pt. Pt will benefit from skilled PT to increase their independence and safety with mobility to allow discharge to the venue listed below.    SATURATION QUALIFICATIONS: (This note is used to comply with regulatory documentation for home oxygen)  Patient Saturations on Room Air at Rest = 94%  Patient Saturations on Room Air while Ambulating = 85%  Patient Saturations on 2 Liters of oxygen while Ambulating = 91%  Please briefly explain why patient needs home oxygen: Pt needed O2 with ambulation to keep sats >90%.    Follow Up Recommendations  SNF;Supervision for mobility/OOB     Equipment Recommendations  Other (comment)(TBA)    Recommendations for Other Services OT consult     Precautions / Restrictions Precautions Precautions: Fall Precaution Comments: watch HR; BP Restrictions Weight Bearing Restrictions: No    Mobility  Bed Mobility Overal bed mobility: Needs Assistance Bed Mobility: Supine to Sit     Supine to sit: Min guard;HOB elevated     General bed mobility comments: Use of rail, increased time to get to EOB.   Transfers Overall transfer level: Needs assistance Equipment used: 1 person hand held assist Transfers: Sit to/from Stand Sit to Stand: Min assist         General transfer comment: Min A to steady in standing due to slight  posterior  lean.  Ambulation/Gait Ambulation/Gait assistance: Min assist;+2 safety/equipment Gait Distance (Feet): 200 Feet Assistive device: Rolling walker (2 wheeled) Gait Pattern/deviations: Step-through pattern;Decreased stride length;Narrow base of support;Trunk flexed Gait velocity: decreased Gait velocity interpretation: <1.31 ft/sec, indicative of household ambulator General Gait Details: Slow, much steadier gait with RW. Needed cues to stay close to RW.  Pt flexes trunk somewhat.    Stairs             Wheelchair Mobility    Modified Rankin (Stroke Patients Only)       Balance Overall balance assessment: Needs assistance;History of Falls Sitting-balance support: Feet supported;No upper extremity supported Sitting balance-Leahy Scale: Good     Standing balance support: During functional activity;Bilateral upper extremity supported Standing balance-Leahy Scale: Poor Standing balance comment: Requires external support for standing and RW.             High level balance activites: Head turns;Direction changes;Turns;Sudden stops;Backward walking High Level Balance Comments: min assist for challenges to balance            Cognition Arousal/Alertness: Awake/alert Behavior During Therapy: WFL for tasks assessed/performed Overall Cognitive Status: No family/caregiver present to determine baseline cognitive functioning Area of Impairment: Memory;Orientation                 Orientation Level: Disoriented to;Situation   Memory: Decreased short-term memory         General Comments: A&Ox3      Exercises      General Comments General comments (skin integrity,  edema, etc.): Desat on RA with activity. Pt on RA at rest sats 90% and greater.       Pertinent Vitals/Pain Pain Assessment: No/denies pain    Home Living                      Prior Function            PT Goals (current goals can now be found in the care plan section) Acute Rehab PT  Goals Patient Stated Goal: to get better Progress towards PT goals: Progressing toward goals    Frequency    Min 3X/week      PT Plan Current plan remains appropriate    Co-evaluation              AM-PAC PT "6 Clicks" Mobility   Outcome Measure  Help needed turning from your back to your side while in a flat bed without using bedrails?: A Little Help needed moving from lying on your back to sitting on the side of a flat bed without using bedrails?: A Little Help needed moving to and from a bed to a chair (including a wheelchair)?: A Little Help needed standing up from a chair using your arms (e.g., wheelchair or bedside chair)?: A Little Help needed to walk in hospital room?: A Little Help needed climbing 3-5 steps with a railing? : A Lot 6 Click Score: 17    End of Session Equipment Utilized During Treatment: Gait belt;Oxygen Activity Tolerance: Patient limited by fatigue Patient left: in chair;with call bell/phone within reach;with chair alarm set Nurse Communication: Mobility status PT Visit Diagnosis: Unsteadiness on feet (R26.81);History of falling (Z91.81);Difficulty in walking, not elsewhere classified (R26.2)     Time: 3716-9678 PT Time Calculation (min) (ACUTE ONLY): 23 min  Charges:  $Gait Training: 23-37 mins                     [place] Pager:  [phone]  Office:  [place] 04/03/2018, 12:46 PM

## 2018-04-03 NOTE — Progress Notes (Signed)
SATURATION QUALIFICATIONS: (This note is used to comply with regulatory documentation for home oxygen)  Patient Saturations on Room Air at Rest = 94%  Patient Saturations on Room Air while Ambulating = 85%  Patient Saturations on 2 Liters of oxygen while Ambulating = 91%  Please briefly explain why patient needs home oxygen: Pt needed O2 with ambulation to keep sats >90%.  Will need O2.  Thanks.  [place] Pager:  [phone]  Office:  [phone]

## 2018-04-03 NOTE — Progress Notes (Signed)
ANTICOAGULATION CONSULT NOTE - Follow up [place] for warfarin Indication: atrial fibrillation  Allergies  Allergen Reactions  . Ceclor [Cefaclor]   . Erythromycin     Patient Measurements: Height: 5\' 3"  (160 cm) Weight: 136 lb 1.6 oz (61.7 kg) IBW/kg (Calculated) : 56.9  Vital Signs: Temp: 97.6 F (36.4 C) (12/04 0641) Temp Source: Oral (12/04 0641) BP: 114/65 (12/04 0959) Pulse Rate: 71 (12/04 0959)  Labs: Recent Labs    04/01/18 0359 04/02/18 0426 04/03/18 0304  HGB 12.8* 12.5* 13.2  HCT 38.8* 37.6* 41.1  PLT 267 291 337  LABPROT 28.4* 26.5* 26.1*  INR 2.72 2.48 2.44    Estimated Creatinine Clearance: 35.5 mL/min (by C-G formula based on SCr of 1.09 mg/dL).   Medical History: Past Medical History:  Diagnosis Date  . Acute myocardial infarction 1987   s/p TPA  . CAD (coronary artery disease)    a. remote inferior wall myocardial infarction (status post streptokinase in 1987-did not require angioplasty).  . Cholelithiases   . Chronic systolic CHF (congestive heart failure) ([place])   . CKD (chronic kidney disease), stage II   . COPD (chronic obstructive pulmonary disease) ([place])   . Disc disease, degenerative, thoracic or thoracolumbar   . ED (erectile dysfunction)   . Emphysema   . HTN (hypertension)   . Hypercholesteremia   . Hypertensive heart disease with chronic systolic congestive heart failure ([place])   . Hyponatremia Sept 2010  . Hypothyroidism   . Permanent atrial fibrillation   . Sleep apnea   . Tobacco abuse   . Tricuspid regurgitation    Assessment: 73 yom presented with possible STEMI. He is on chronic warfarin for history of afib. Home dose warfarin 2.5 mg daily except 5 mg M/W/F.  INR remains therapeutic at 2.44, stable. Warfarin resumed 12/2 after holding warfarin 12/1 and reduced doses the previous 2 nights. CBC stable, no signs/symptoms of bleeding documented. Noted, patient started on amiodarone, expect to see effects on  INR in upcoming weeks. Plan is amiodarone 400mg  BID inpatient, then 400mg  daily on d/c per Cards note.  Goal of Therapy:  INR 2-3 Monitor platelets by anticoagulation protocol: Yes   Plan:  Warfarin 2.5mg  PO x 1 tonight Monitor daily INR, CBC, s/sx bleeding, DDI with amio May need to further reduce dose if INR trends up again  Elicia Lamp, PharmD, BCPS Clinical Pharmacist Clinical phone [phone] Please check AMION for all [place] contact numbers 04/03/2018 10:12 AM

## 2018-04-03 NOTE — Progress Notes (Signed)
Progress Note  Patient Name: Christopher Butler Date of Encounter: 04/03/2018  Primary Cardiologist: Mertie Moores, MD   Subjective   Getting breathing treatment   Pt dneis SOB   No CP    Inpatient Medications    Scheduled Meds: . amiodarone  400 mg Oral BID  . aspirin EC  81 mg Oral Daily  . atorvastatin  10 mg Oral Daily  . budesonide (PULMICORT) nebulizer solution  0.5 mg Nebulization BID  . feeding supplement (ENSURE ENLIVE)  237 mL Oral BID BM  . furosemide  40 mg Intravenous BID  . levothyroxine  50 mcg Oral QAC breakfast  . metoprolol tartrate  25 mg Oral BID  . nicotine  7 mg Transdermal Daily  . potassium chloride  40 mEq Oral BID  . tamsulosin  0.4 mg Oral QHS  . Warfarin - Pharmacist Dosing Inpatient   Does not apply q1800   Continuous Infusions:  PRN Meds: acetaminophen, guaiFENesin, ipratropium, levalbuterol, LORazepam, magnesium hydroxide, menthol-cetylpyridinium, nitroGLYCERIN, ondansetron (ZOFRAN) IV, sodium chloride, traMADol   Vital Signs    Vitals:   04/02/18 1951 04/02/18 2046 04/03/18 0641 04/03/18 0739  BP: 139/90  120/67 132/78  Pulse: 67  (!) 58 64  Resp: 18  18 (!) 23  Temp: 98 F (36.7 C)  97.6 F (36.4 C)   TempSrc: Oral  Oral   SpO2: 97% 92% 94% 96%  Weight:   61.7 kg   Height:        Intake/Output Summary (Last 24 hours) at 04/03/2018 0827 Last data filed at 04/03/2018 0623 Gross per 24 hour  Intake 720 ml  Output 1701 ml  Net -981 ml   Filed Weights   04/01/18 0341 04/02/18 0427 04/03/18 0641  Weight: 63.5 kg 63.4 kg 61.7 kg    Telemetry     Afib with NSVT - Personally Reviewed  ECG    Not done  - Personally Reviewed  Physical Exam   GEN: No acute distress.   Neck: No JVD Cardiac: Irreg rate / rhythm  , no murmurs, rubs, or gallops.  Respiratory   Rel clear  GI: Soft, nontender, non-distended  MS: No edema; No deformity. Neuro:  Nonfocal  Psych: Normal affect   Labs    Chemistry Recent Labs  Lab  03/28/18 1434  03/29/18 0509 03/29/18 1544 03/31/18 0231  NA 133*   < > 131* 131* 132*  K 3.5   < > 2.9* 3.8 3.8  CL 101   < > 101 103 100  CO2 16*  --  20* 21* 21*  GLUCOSE 228*   < > 109* 133* 107*  BUN 25*   < > 29* 25* 21  CREATININE 1.38*   < > 1.04 1.08 1.09  CALCIUM 8.8*  --  8.4* 8.4* 8.5*  PROT 6.7  --   --   --   --   ALBUMIN 3.5  --   --   --   --   AST 98*  --   --   --   --   ALT 68*  --   --   --   --   ALKPHOS 144*  --   --   --   --   BILITOT 1.4*  --   --   --   --   GFRNONAA 44*  --  >60 60* 59*  GFRAA 51*  --  >60 >60 >60  ANIONGAP 16*  --  10 7 11    < > =  values in this interval not displayed.     Hematology Recent Labs  Lab 04/01/18 0359 04/02/18 0426 04/03/18 0304  WBC 13.4* 11.9* 13.4*  RBC 4.09* 3.97* 4.28  HGB 12.8* 12.5* 13.2  HCT 38.8* 37.6* 41.1  MCV 94.9 94.7 96.0  MCH 31.3 31.5 30.8  MCHC 33.0 33.2 32.1  RDW 12.2 12.2 12.4  PLT 267 291 337    Cardiac Enzymes Recent Labs  Lab 03/29/18 0509 03/29/18 1841 03/30/18 0015 03/30/18 0632  TROPONINI 3.72* 1.83* 1.13* 0.86*    Recent Labs  Lab 03/28/18 1435  TROPIPOC 0.23*     BNPNo results for input(s): BNP, PROBNP in the last 168 hours.   DDimer No results for input(s): DDIMER in the last 168 hours.   Radiology    No results found.  Cardiac Studies    Patient Profile     84 y.o. male  h/o MI & thrombolytics in 1987, HFrEF, permanent Afib, COPD, HTN, HL, and hypothyroidism admitted w/ sustained VT and NSTEMI  Assessment & Plan   1  VT  On admit had fast monomorphic VT   He has not had this since   He has some irreg NSVT   Continue on amiodarone 400 bid here in hosp then 400 daily   2  Syncope   May have been multifactoral     He did not meet criteria for orhtostatic hypotension     3  CAD   Peak troponin 5.46  Probable CAD Denies CP    Plan for medical Rx given age   20    Afib   Permanent  Rates controlled   COntinue amio and anticoagulation    5  Chronic systolic  CHF   LVEF on echo 20 to 25%   Diffuse hypokinesis, inferolateral aakinesis.   Probable CAD   Will stop IV lasix (40 bid started on 11/30)    Volume will need to be followed closely as outpt  Would prob put on 40 po lasix daily   On metoprolol  Not on ARB    Will recheck orhtostatics   6Hypokalemia   Check BMET   7   Tob abuse    nicotine patch \ 8   Dispo   Note plans for [facility]   Probable d/c tomorrow.    For questions or updates, please contact [place] Please consult [email or web address] for contact info under        Signed, Dorris Carnes, MD  04/03/2018, 8:27 AM

## 2018-04-03 NOTE — Care Management Important Message (Signed)
Important Message  Patient Details  Name: Christopher Butler MRN: [id number] Date of Birth: [birth year]   Medicare Important Message Given:  Yes    Ferry Matthis 04/03/2018, 10:45 AM

## 2018-04-03 NOTE — Care Management Note (Signed)
Case Management Note  Patient Details  Name: Christopher Butler MRN: [id number] Date of Birth: 06-02-1924  Subjective/Objective: Pt presented for VT- Awaiting for insurance authorization. CSW following for transition of care needs.                    Action/Plan: No further needs from CM at this time.   Expected Discharge Date:                  Expected Discharge Plan:  Skilled Nursing Facility  In-House Referral:  Clinical Social Work  Discharge planning Services  CM Consult  Post Acute Care Choice:  NA Choice offered to:  NA  DME Arranged:  N/A DME Agency:  NA  HH Arranged:  NA HH Agency:  NA  Status of Service:  Completed, signed off  If discussed at [place] of Stay Meetings, dates discussed:    Additional Comments:  Bethena Roys, RN 04/03/2018, 2:39 PM

## 2018-04-03 NOTE — Progress Notes (Signed)
Continuing to await [employer] authorization for [employer]. Auth required before patient can admit to SNF. Will follow and support with discharge planning.  Estanislado Emms, LCSW [phone]

## 2018-04-04 LAB — PROTIME-INR
INR: 2.71
Prothrombin Time: 28.4 seconds — ABNORMAL HIGH (ref 11.4–15.2)

## 2018-04-04 MED ORDER — SODIUM CHLORIDE 0.9 % IV SOLN
INTRAVENOUS | Status: AC
  Administered 2018-04-04: 09:00:00 via INTRAVENOUS

## 2018-04-04 MED ORDER — WARFARIN SODIUM 2.5 MG PO TABS
2.5000 mg | ORAL_TABLET | Freq: Once | ORAL | Status: AC
  Administered 2018-04-04: 2.5 mg via ORAL
  Filled 2018-04-04: qty 1

## 2018-04-04 NOTE — Progress Notes (Signed)
ANTICOAGULATION CONSULT NOTE - Follow up [place] for warfarin Indication: atrial fibrillation  Allergies  Allergen Reactions  . Ceclor [Cefaclor]   . Erythromycin     Patient Measurements: Height: 5\' 3"  (160 cm) Weight: 132 lb 11.2 oz (60.2 kg) IBW/kg (Calculated) : 56.9  Vital Signs: Temp: 98 F (36.7 C) (12/05 0450) Temp Source: Oral (12/05 0450) BP: 115/79 (12/05 0450) Pulse Rate: 65 (12/05 0826)  Labs: Recent Labs    04/02/18 0426 04/03/18 0304 04/03/18 1008 04/04/18 0357  HGB 12.5* 13.2  --   --   HCT 37.6* 41.1  --   --   PLT 291 337  --   --   LABPROT 26.5* 26.1*  --  28.4*  INR 2.48 2.44  --  2.71  CREATININE  --   --  1.65*  --     Estimated Creatinine Clearance: 23.5 mL/min (A) (by C-G formula based on SCr of 1.65 mg/dL (H)).   Medical History: Past Medical History:  Diagnosis Date  . Acute myocardial infarction 1987   s/p TPA  . CAD (coronary artery disease)    a. remote inferior wall myocardial infarction (status post streptokinase in 1987-did not require angioplasty).  . Cholelithiases   . Chronic systolic CHF (congestive heart failure) ([place])   . CKD (chronic kidney disease), stage II   . COPD (chronic obstructive pulmonary disease) ([place])   . Disc disease, degenerative, thoracic or thoracolumbar   . ED (erectile dysfunction)   . Emphysema   . HTN (hypertension)   . Hypercholesteremia   . Hypertensive heart disease with chronic systolic congestive heart failure ([place])   . Hyponatremia Sept 2010  . Hypothyroidism   . Permanent atrial fibrillation   . Sleep apnea   . Tobacco abuse   . Tricuspid regurgitation    Assessment: 46 yom presented with possible STEMI. He is on chronic warfarin for history of afib. Home dose warfarin 2.5 mg daily except 5 mg M/W/F.  INR remains therapeutic, increased to 2.71. Warfarin resumed 12/2 after holding warfarin 12/1 and reduced doses the previous 2 nights. CBC stable, no signs/symptoms of  bleeding documented. Noted, patient started on amiodarone, expect to see effects on INR in upcoming weeks. Plan is amiodarone 400mg  BID inpatient, then 400mg  daily on d/c per Cards note.  Goal of Therapy:  INR 2-3 Monitor platelets by anticoagulation protocol: Yes   Plan:  Warfarin 2.5mg  PO x 1 tonight Monitor daily INR, CBC, s/sx bleeding, DDI with amio May need to further reduce dose if INR trends up again  Elicia Lamp, PharmD, BCPS Clinical Pharmacist Clinical phone [phone] Please check AMION for all [place] contact numbers 04/04/2018 9:16 AM

## 2018-04-04 NOTE — Progress Notes (Signed)
Progress Note  Patient Name: Christopher Butler Date of Encounter: 04/04/2018  Primary Cardiologist: Mertie Moores, MD   Subjective   No SOB  No CP    Inpatient Medications    Scheduled Meds: . amiodarone  400 mg Oral BID  . aspirin EC  81 mg Oral Daily  . atorvastatin  10 mg Oral Daily  . budesonide (PULMICORT) nebulizer solution  0.5 mg Nebulization BID  . feeding supplement (ENSURE ENLIVE)  237 mL Oral BID BM  . levothyroxine  50 mcg Oral QAC breakfast  . metoprolol tartrate  25 mg Oral BID  . nicotine  7 mg Transdermal Daily  . potassium chloride  40 mEq Oral BID  . tamsulosin  0.4 mg Oral QHS  . Warfarin - Pharmacist Dosing Inpatient   Does not apply q1800   Continuous Infusions:  PRN Meds: acetaminophen, guaiFENesin, ipratropium, levalbuterol, LORazepam, magnesium hydroxide, menthol-cetylpyridinium, nitroGLYCERIN, ondansetron (ZOFRAN) IV, sodium chloride, traMADol   Vital Signs    Vitals:   04/03/18 1954 04/03/18 2115 04/04/18 0450 04/04/18 0826  BP: 124/69  115/79   Pulse: 60  (!) 55 65  Resp: 18  18 18   Temp: 98.3 F (36.8 C)  98 F (36.7 C)   TempSrc: Oral  Oral   SpO2: 95% 95% 94% 95%  Weight:   60.2 kg   Height:        Intake/Output Summary (Last 24 hours) at 04/04/2018 0828 Last data filed at 04/04/2018 0700 Gross per 24 hour  Intake 1298 ml  Output 1500 ml  Net -202 ml   Filed Weights   04/02/18 0427 04/03/18 0641 04/04/18 0450  Weight: 63.4 kg 61.7 kg 60.2 kg    Telemetry     Afib with NSVT   Rates 80s  - Personally Reviewed  ECG    Not done  - Personally Reviewed  Physical Exam   GEN: No acute distress.   Neck: No JVD Cardiac: Irreg rate / rhythm  , no murmurs, rubs, or gallops.  Respiratory   Rel clear  GI: Soft, nontender, non-distended  MS: No edema; No deformity. Neuro:  Nonfocal  Psych: Normal affect   Labs    Chemistry Recent Labs  Lab 03/28/18 1434  03/29/18 1544 03/31/18 0231 04/03/18 1008  NA 133*   < > 131*  132* 134*  K 3.5   < > 3.8 3.8 4.5  CL 101   < > 103 100 97*  CO2 16*   < > 21* 21* 24  GLUCOSE 228*   < > 133* 107* 99  BUN 25*   < > 25* 21 40*  CREATININE 1.38*   < > 1.08 1.09 1.65*  CALCIUM 8.8*   < > 8.4* 8.5* 8.9  PROT 6.7  --   --   --   --   ALBUMIN 3.5  --   --   --   --   AST 98*  --   --   --   --   ALT 68*  --   --   --   --   ALKPHOS 144*  --   --   --   --   BILITOT 1.4*  --   --   --   --   GFRNONAA 44*   < > 60* 59* 36*  GFRAA 51*   < > >60 >60 41*  ANIONGAP 16*   < > 7 11 13    < > = values in this  interval not displayed.     Hematology Recent Labs  Lab 04/01/18 0359 04/02/18 0426 04/03/18 0304  WBC 13.4* 11.9* 13.4*  RBC 4.09* 3.97* 4.28  HGB 12.8* 12.5* 13.2  HCT 38.8* 37.6* 41.1  MCV 94.9 94.7 96.0  MCH 31.3 31.5 30.8  MCHC 33.0 33.2 32.1  RDW 12.2 12.2 12.4  PLT 267 291 337    Cardiac Enzymes Recent Labs  Lab 03/29/18 0509 03/29/18 1841 03/30/18 0015 03/30/18 0632  TROPONINI 3.72* 1.83* 1.13* 0.86*    Recent Labs  Lab 03/28/18 1435  TROPIPOC 0.23*     BNPNo results for input(s): BNP, PROBNP in the last 168 hours.   DDimer No results for input(s): DDIMER in the last 168 hours.   Radiology    No results found.  Cardiac Studies    Patient Profile     84 y.o. male  h/o MI & thrombolytics in 1987, HFrEF, permanent Afib, COPD, HTN, HL, and hypothyroidism admitted w/ sustained VT and NSTEMI  Assessment & Plan   1  VT  On admit had fast monomorphic VT   He has not had this since   He has some irreg NSVT   Continue on amiodarone 400 bid here in hosp then 400 daily   2  Syncope   May have been multifactoral     He did meet criteria for orhtostatic hypotension  BP dropped to 86 with standing    3  CAD   Peak troponin 5.46  Probable CAD Denies CP    Plan for medical Rx given age   21    Afib   Permanent  Rates controlled   COntinue amio and anticoagulation    5  Chronic systolic CHF   LVEF on echo 20 to 25%   Diffuse hypokinesis,  inferolateral aakinesis.   Probable CAD    Stopped  IV lasix yesterday  (40 bid started on 11/30)    Cr was 1,6 today   With orhtostasis will give some IV fluids today    Check orthostatics later  6Hypokalemia   Check BMET   7   Tob abuse    nicotine patch \ 8   Dispo   Note plans for [place] place   HOld on d/c until after IV   Make sure not hyptensive  When does leave will need close outpt f/u   Will make sure he has in cardiology   For questions or updates, please contact [place] Please consult [email or web address] for contact info under        Signed, Dorris Carnes, MD  04/04/2018, 8:28 AM

## 2018-04-04 NOTE — Clinical Social Work Note (Addendum)
Insurance authorization still pending.  Dayton Scrape, [place] [phone]  1:11 pm Insurance denied authorization for SNF admission. Patient, daughter, and MD aware. Daughter stated patient lives at [place] and mentioned [employer]. RNCM will follow up to arrange home health needs.  CSW signing off.   Dayton Scrape, [place]

## 2018-04-04 NOTE — Care Management Note (Addendum)
Case Management Note  Patient Details  Name: Christopher Butler MRN: [id number] Date of Birth: Dec 03, 1924  Subjective/Objective:   VT                Action/Plan: Received call from Annye Rusk that patient was denied for SNF placement; CM talked to patient at the bedside for [facility] choices, also pt gave CM permission to call his daughter Noreene Larsson; they chose [facility]; [place] with [facility] called for arrangements; [facility] list given to patient and copy placed on shadow chart. CM will continue to follow for progression of care. B Haleigh Desmith RN,MA,BSN  Late entry 04/04/2018 3:50 pm- Received call from [place] with [facility], they are out of network with the patient's insurance ( 40% out of pocket); CM talked to patient again for [facility] choices, he chose [place]; [place] with [facility] called for arrangements. Mindi Slicker RN,MHA,BSN  Expected Discharge Date:    possibly 04/07/2018              Expected Discharge Plan:  Home with [facility] In-House Referral:  Clinical Social Work  Discharge planning Services  CM Consult  Choice offered to:  Patient, Adult Children  HH Arranged:  RN, PT, OT, Nurse's Aide [place] Agency:  [facility]  Status of Service:  In process, will continue to follow  Sherrilyn Rist [phone] 04/04/2018, 1:30 PM

## 2018-04-05 LAB — BASIC METABOLIC PANEL
Anion gap: 9 (ref 5–15)
BUN: 37 mg/dL — ABNORMAL HIGH (ref 8–23)
CO2: 20 mmol/L — ABNORMAL LOW (ref 22–32)
CREATININE: 1.34 mg/dL — AB (ref 0.61–1.24)
Calcium: 8.5 mg/dL — ABNORMAL LOW (ref 8.9–10.3)
Chloride: 106 mmol/L (ref 98–111)
GFR calc Af Amer: 53 mL/min — ABNORMAL LOW (ref >60)
GFR calc non Af Amer: 46 mL/min — ABNORMAL LOW (ref >60)
Glucose, Bld: 151 mg/dL — ABNORMAL HIGH (ref 70–99)
Potassium: 4.8 mmol/L (ref 3.5–5.1)
Sodium: 135 mmol/L (ref 135–145)

## 2018-04-05 LAB — PROTIME-INR
INR: 2.73
PROTHROMBIN TIME: 28.6 s — AB (ref 11.4–15.2)

## 2018-04-05 MED ORDER — AMIODARONE HCL 400 MG PO TABS: 400.0000 mg | ORAL_TABLET | Freq: Every day | ORAL | 5 refills | Status: DC

## 2018-04-05 MED ORDER — NITROGLYCERIN 0.4 MG SL SUBL: 0.4000 mg | SUBLINGUAL_TABLET | SUBLINGUAL | 2 refills | Status: AC | PRN

## 2018-04-05 MED ORDER — POTASSIUM CHLORIDE CRYS ER 20 MEQ PO TBCR: 40.0000 meq | EXTENDED_RELEASE_TABLET | Freq: Two times a day (BID) | ORAL | 5 refills | Status: DC

## 2018-04-05 MED ORDER — METOPROLOL SUCCINATE ER 50 MG PO TB24: 50.0000 mg | ORAL_TABLET | Freq: Two times a day (BID) | ORAL | 5 refills | Status: AC

## 2018-04-05 MED ORDER — AMIODARONE HCL 200 MG PO TABS
400.0000 mg | ORAL_TABLET | Freq: Every day | ORAL | Status: DC
  Administered 2018-04-05: 400 mg via ORAL
  Filled 2018-04-05: qty 2

## 2018-04-05 MED ORDER — ASPIRIN 81 MG PO TBEC: 81.0000 mg | DELAYED_RELEASE_TABLET | Freq: Every day | ORAL | Status: AC

## 2018-04-05 MED ORDER — METOPROLOL SUCCINATE ER 50 MG PO TB24: 50.0000 mg | ORAL_TABLET | Freq: Two times a day (BID) | ORAL | Status: DC

## 2018-04-05 NOTE — Care Management Important Message (Signed)
Important Message  Patient Details  Name: Christopher Butler MRN: [id number] Date of Birth: Apr 20, 1925   Medicare Important Message Given:  Yes    Nolia Tschantz P [place] 04/05/2018, 1:20 PM

## 2018-04-05 NOTE — Progress Notes (Signed)
Physical Therapy Treatment Patient Details Name: Christopher Butler MRN: [id number] DOB: Jul 17, 1924 Today's Date: 04/05/2018    History of Present Illness Patient is a 84 y/o male who presents with CP, SOB and syncope and found to have ventricular tachycardia with rates >200. PMH includes A-fib, HTN, COPD, CKd, CAD, MI.    PT Comments    Pt admitted with above diagnosis. Pt currently with functional limitations due to the deficits listed below (see PT Problem List). Pt was able to ambulate with RW with occasional cues to stay close to RW.  Did not lose balance in controlled environment.  Still feel that ultimately pt would benefit from short SNF stay however pt was denied therefore [facility] services recommended.   Pt will benefit from skilled PT to increase their independence and safety with mobility to allow discharge to the venue listed below.     Follow Up Recommendations  Supervision for mobility/OOB;Home health PT, HHOT, HHaide     Equipment Recommendations  Rolling walker with 5" wheels    Recommendations for Other Services       Precautions / Restrictions Precautions Precautions: Fall Restrictions Weight Bearing Restrictions: No    Mobility  Bed Mobility Overal bed mobility: Needs Assistance Bed Mobility: Supine to Sit     Supine to sit: Supervision        Transfers Overall transfer level: Needs assistance Equipment used: Rolling walker (2 wheeled) Transfers: Sit to/from Stand Sit to Stand: Supervision         General transfer comment: Incr time for pt to get balance but did so on his own.   Ambulation/Gait Ambulation/Gait assistance: Supervision;Min guard Gait Distance (Feet): 200 Feet Assistive device: Rolling walker (2 wheeled) Gait Pattern/deviations: Step-through pattern;Decreased stride length;Narrow base of support;Trunk flexed Gait velocity: decreased Gait velocity interpretation: <1.31 ft/sec, indicative of household ambulator General Gait Details:  Steady gait with RW. Still needed cues to stay close to RW.  Pt flexes trunk somewhat but has stated it is premorbid.    Stairs             Wheelchair Mobility    Modified Rankin (Stroke Patients Only)       Balance Overall balance assessment: Needs assistance;History of Falls Sitting-balance support: Feet supported;No upper extremity supported Sitting balance-Leahy Scale: Good     Standing balance support: During functional activity;Bilateral upper extremity supported Standing balance-Leahy Scale: Poor Standing balance comment: Requires external support for standing and RW.             High level balance activites: Direction changes;Turns;Sudden stops High Level Balance Comments: min guard assist for challenges to balance            Cognition Arousal/Alertness: Awake/alert Behavior During Therapy: WFL for tasks assessed/performed Overall Cognitive Status: No family/caregiver present to determine baseline cognitive functioning Area of Impairment: Memory                     Memory: Decreased short-term memory         General Comments: A&Ox3      Exercises      General Comments General comments (skin integrity, edema, etc.): Pt sats >90% on RA at rest and with activity.       Pertinent Vitals/Pain Pain Assessment: No/denies pain    Home Living                      Prior Function  PT Goals (current goals can now be found in the care plan section) Acute Rehab PT Goals Patient Stated Goal: to get better Progress towards PT goals: Progressing toward goals    Frequency    Min 3X/week      PT Plan Discharge plan needs to be updated    Co-evaluation              AM-PAC PT "6 Clicks" Mobility   Outcome Measure  Help needed turning from your back to your side while in a flat bed without using bedrails?: None Help needed moving from lying on your back to sitting on the side of a flat bed without using  bedrails?: None Help needed moving to and from a bed to a chair (including a wheelchair)?: None Help needed standing up from a chair using your arms (e.g., wheelchair or bedside chair)?: None Help needed to walk in hospital room?: A Little Help needed climbing 3-5 steps with a railing? : A Lot 6 Click Score: 21    End of Session Equipment Utilized During Treatment: Gait belt Activity Tolerance: Patient limited by fatigue Patient left: in chair;with call bell/phone within reach;with chair alarm set Nurse Communication: Mobility status PT Visit Diagnosis: Unsteadiness on feet (R26.81);History of falling (Z91.81);Difficulty in walking, not elsewhere classified (R26.2)     Time: 0814-4818 PT Time Calculation (min) (ACUTE ONLY): 15 min  Charges:  $Gait Training: 8-22 mins                     [place] Pager:  [phone]  Office:  [place] 04/05/2018, 11:39 AM

## 2018-04-05 NOTE — Progress Notes (Signed)
Progress Note  Patient Name: Christopher Butler Date of Encounter: 04/05/2018  Primary Cardiologist: Mertie Moores, MD   Subjective   Breathing is good   No CP    Inpatient Medications    Scheduled Meds: . amiodarone  400 mg Oral BID  . aspirin EC  81 mg Oral Daily  . atorvastatin  10 mg Oral Daily  . budesonide (PULMICORT) nebulizer solution  0.5 mg Nebulization BID  . feeding supplement (ENSURE ENLIVE)  237 mL Oral BID BM  . levothyroxine  50 mcg Oral QAC breakfast  . metoprolol tartrate  25 mg Oral BID  . nicotine  7 mg Transdermal Daily  . potassium chloride  40 mEq Oral BID  . tamsulosin  0.4 mg Oral QHS  . Warfarin - Pharmacist Dosing Inpatient   Does not apply q1800   Continuous Infusions:  PRN Meds: acetaminophen, guaiFENesin, ipratropium, levalbuterol, LORazepam, magnesium hydroxide, menthol-cetylpyridinium, nitroGLYCERIN, ondansetron (ZOFRAN) IV, sodium chloride, traMADol   Vital Signs    Vitals:   04/04/18 2036 04/04/18 2152 04/04/18 2156 04/05/18 0638  BP: 113/76   139/80  Pulse: 67   (!) 59  Resp: 16   16  Temp: 98.2 F (36.8 C)   98 F (36.7 C)  TempSrc: Oral   Oral  SpO2: 96% 94% 94% 97%  Weight:    63.1 kg  Height:        Intake/Output Summary (Last 24 hours) at 04/05/2018 0749 Last data filed at 04/05/2018 0640 Gross per 24 hour  Intake 720 ml  Output 600 ml  Net 120 ml   Filed Weights   04/03/18 0641 04/04/18 0450 04/05/18 0638  Weight: 61.7 kg 60.2 kg 63.1 kg    Telemetry    Afb    PVCs  80s   - Personally Reviewed  ECG      Physical Exam   GEN: No acute distress.   Neck: No JVD Cardiac: Irreg rate / rhythm  , no murmurs, rubs, or gallops.  Respiratory   Minimal rale R base  GI: Soft, nontender, non-distended  MS: No edema; No deformity. Neuro:  Nonfocal  Psych: Normal affect   Labs    Chemistry Recent Labs  Lab 03/29/18 1544 03/31/18 0231 04/03/18 1008  NA 131* 132* 134*  K 3.8 3.8 4.5  CL 103 100 97*  CO2 21*  21* 24  GLUCOSE 133* 107* 99  BUN 25* 21 40*  CREATININE 1.08 1.09 1.65*  CALCIUM 8.4* 8.5* 8.9  GFRNONAA 60* 59* 36*  GFRAA >60 >60 41*  ANIONGAP 7 11 13      Hematology Recent Labs  Lab 04/01/18 0359 04/02/18 0426 04/03/18 0304  WBC 13.4* 11.9* 13.4*  RBC 4.09* 3.97* 4.28  HGB 12.8* 12.5* 13.2  HCT 38.8* 37.6* 41.1  MCV 94.9 94.7 96.0  MCH 31.3 31.5 30.8  MCHC 33.0 33.2 32.1  RDW 12.2 12.2 12.4  PLT 267 291 337    Cardiac Enzymes Recent Labs  Lab 03/29/18 1841 03/30/18 0015 03/30/18 0632  TROPONINI 1.83* 1.13* 0.86*    No results for input(s): TROPIPOC in the last 168 hours.   BNPNo results for input(s): BNP, PROBNP in the last 168 hours.   DDimer No results for input(s): DDIMER in the last 168 hours.   Radiology    No results found.  Cardiac Studies    Patient Profile     84 y.o. male  h/o MI & thrombolytics in 1987, HFrEF, permanent Afib, COPD, HTN, HL, and  hypothyroidism admitted w/ sustained VT and NSTEMI  Assessment & Plan   1  VT  On admit had fast monomorphic VT   He has not had this since   He has some irreg NSVT   Continue on amiodarone 400 mg daily   WIll need outpt f/u for taper    2  Syncope   May have been multifactoral    VT  ANd He did meet criteria for orhtostatic hypotension  BP dropped to 86 with standing  This has improved  Follow   upt sow    3  CAD   Peak troponin 5.46  Probable CAD Denies CP    Plan for medical Rx given age   59    Afib   Permanent  Rates controlled   COntinue amio and anticoagulation    5  Chronic systolic CHF   LVEF on echo 20 to 25%   Diffuse hypokinesis, inferolateral akinesis.   Probable CAD   Volume status is fair   He was not on lasix at home   WIll need to be followed closely   Low NA diet     6 Tob abuse    nicotine patch \ 8   Dispo  D/C to assisted living   OUtpt f/u will be needed.    For questions or updates, please contact [place] Please consult [email or web address] for contact info under         Signed, Dorris Carnes, MD  04/05/2018, 7:49 AM

## 2018-04-05 NOTE — Discharge Summary (Signed)
Discharge Summary    Patient ID: Christopher Butler MRN: [id number]; DOB: Oct 22, 1924  Admit date: 03/28/2018 Discharge date: 04/05/2018  Primary Care Provider: Lajean Manes, MD  Primary Cardiologist: Mertie Moores, MD  Primary Electrophysiologist:  None   Discharge Diagnoses    Active Problems:   Ventricular tachycardia ([place])   Allergies Allergies  Allergen Reactions  . Ceclor [Cefaclor]   . Erythromycin     Diagnostic Studies/Procedures    2D Echo 03/29/18 Study Conclusions  - Left ventricle: The cavity size was moderately dilated. Wall   thickness was normal. Systolic function was severely reduced. The   estimated ejection fraction was in the range of 20% to 25%.   Diffuse hypokinesis with inferolateral akinesis. The study is not   technically sufficient to allow evaluation of LV diastolic   function. - Mitral valve: Mildly thickened leaflets . There was moderate   regurgitation. - Left atrium: Massively dilated. - Tricuspid valve: There was mild regurgitation. - Pulmonary arteries: PA peak pressure: 46 mm Hg (S). - Systemic veins: Not visualized.  Impressions:  - Technically difficult study. LVEF 20-25%, severe global   hypokinesis with inferolateral akinesis, moderately dilated LV,   moderate MR, severe LAE, mild TR, RVSP 46 mmHg + RAP, no   pericardial effusion.   History of Present Illness     Christopher Butler is a 84 y.o. male with a history of acute myocardial infarction treated with thrombolytics in 0347, chronic systolic heart failure with LVEF 35%, permanent atrial fibrillation, history of COPD, hypertension and hypercholesterolemia, hypothyroidism, admitted after several syncopal events and found to have sustained ventricular tachycardia.  Christopher Butler called emergency medical services day of admission, 03/28/18, for severe weakness, dizziness and near syncope, severe diaphoresis.  Upon EMS arrival he was found to have wide-complex tachycardia with a  rate of approximately 210 bpm, but was alert and oriented.  He received a single dose of amiodarone 150 mg intravenously, converting back to his baseline rhythm of atrial fibrillation with controlled ventricular response and with resolution of his complaints.  ECG obtained by EMS showed monomorphic VT at approximately 212 bpm with right superior axis, positive concordance and very broad QRS well over 150 ms.  The subsequent ECG performed in the ED showed atrial fibrillation with PVCs and prominent ST depression across the anterior precordial leads, QTC 441 ms. Electrolytes were stable.  On arrival to the emergency room 1 of his ECGs was worrisome for ST segment elevation in leads V4 V5, but repeat electrocardiogram roughly 10 minutes later showed resolution of the abnormality with return to baseline.   On cardiology's evaluation in the ED, he was in atrial fibrillation and denied angina, dyspnea, diaphoresis, nausea, dizziness/near syncope or palpitations. He did report a syncopal episode the day prior as well.  Pt was evaluated in the ED by Dr. Sallyanne Kuster. He felt his VT was c/w inferior scar. He was admitted by general cardiology and placed on amiodarone drip.    Hospital Course     Pt was monitored on tele and continued on amiodarone drip. He was noted to have elevated troponins, peaking at 5.46, but this was felt related to demand ischemia from VT. He denied recent or active anginal symptoms. Echo showed reduced LVEF at 20-25%. Repeat cath was not recommended given lack of symptoms. He was seen by EP who recommended continuation of amiodarone. It was determined he is not a candidate for ICD due to advanced age. He did well on IV amiodarone with no significant  recurrence. Some NSVT but no sustained VT. After IV amio infusion, he was transitioned to 400 mg BID for the remainder of his hospital stay. Dose was reduced down to 400 mg daily at time of discharge.   Pt also had issues with orthostatic  hypotension w/ SBPs dropping into the 80s upon standing. His meds were adjusted. His home lisinopril and amlodipine were discontinued. Metoprolol was continued for VT but he tolerated ok. Flomax also continued for BPH. BP was stable day of d/c.   His atrial fibrillation remained well controlled from a rate standpoint. Coumadin was continued. INR therapeutic day of discharge at 2.73. Hgb stable during admission.   From a HF standpoint, his volume was felt to be ok. He was not previously on lasix at home. He was advised to maintain a low salt diet and monitor weights closely.   On 04/05/18, he was seen and examined by Dr. Harrington Challenger, who determined he was stable for discharge home. Post hospital f/u has been arranged.    Consultants: Electrophysiology    Discharge Vitals Blood pressure 120/73, pulse 68, temperature 98 F (36.7 C), temperature source Oral, resp. rate 16, height 5\' 3"  (1.6 m), weight 63.1 kg, SpO2 96 %.  Filed Weights   04/03/18 0641 04/04/18 0450 04/05/18 0638  Weight: 61.7 kg 60.2 kg 63.1 kg    Labs & Radiologic Studies    CBC Recent Labs    04/03/18 0304  WBC 13.4*  HGB 13.2  HCT 41.1  MCV 96.0  PLT 938   Basic Metabolic Panel Recent Labs    04/03/18 1008 04/05/18 0836  NA 134* 135  K 4.5 4.8  CL 97* 106  CO2 24 20*  GLUCOSE 99 151*  BUN 40* 37*  CREATININE 1.65* 1.34*  CALCIUM 8.9 8.5*   Liver Function Tests No results for input(s): AST, ALT, ALKPHOS, BILITOT, PROT, ALBUMIN in the last 72 hours. No results for input(s): LIPASE, AMYLASE in the last 72 hours. Cardiac Enzymes No results for input(s): CKTOTAL, CKMB, CKMBINDEX, TROPONINI in the last 72 hours. BNP Invalid input(s): POCBNP D-Dimer No results for input(s): DDIMER in the last 72 hours. Hemoglobin A1C No results for input(s): HGBA1C in the last 72 hours. Fasting Lipid Panel No results for input(s): CHOL, HDL, LDLCALC, TRIG, CHOLHDL, LDLDIRECT in the last 72 hours. Thyroid Function Tests No  results for input(s): TSH, T4TOTAL, T3FREE, THYROIDAB in the last 72 hours.  Invalid input(s): FREET3 _____________  Dg Chest 2 View  Result Date: 03/27/2018 CLINICAL DATA:  Cough, shortness of breath. EXAM: CHEST - 2 VIEW COMPARISON:  Radiographs of October 24, 2011. FINDINGS: Stable cardiomegaly. No pneumothorax or pleural effusion is noted. Atherosclerosis of thoracic aorta is noted. Mild bibasilar subsegmental atelectasis or scarring is noted. Bony thorax is unremarkable. IMPRESSION: Mild bibasilar subsegmental atelectasis or scarring. Aortic Atherosclerosis (ICD10-I70.0). Electronically Signed   By: Marijo Conception, M.D.   On: 03/27/2018 10:30   Dg Chest Port 1 View  Result Date: 03/29/2018 CLINICAL DATA:  Wheezing EXAM: PORTABLE CHEST 1 VIEW COMPARISON:  March 28, 2018 FINDINGS: There is interstitial prominence in the bases, likely mild interstitial edema. There is no frank airspace consolidation. There is cardiomegaly with pulmonary vascularity within normal limits. There is aortic atherosclerosis. No adenopathy. No bone lesions. IMPRESSION: Cardiomegaly with suspected bibasilar interstitial edema. Lungs elsewhere clear. There is aortic atherosclerosis. Aortic Atherosclerosis (ICD10-I70.0). Electronically Signed   By: Lowella Grip III M.D.   On: 03/29/2018 09:30   Dg Chest Port 1  View  Result Date: 03/28/2018 CLINICAL DATA:  Chest pain EXAM: PORTABLE CHEST 1 VIEW COMPARISON:  March 27, 2018 FINDINGS: The study is limited due to patient rotation. Cardiomegaly remains. There is a tortuous thoracic aorta which is similar in the interval. No change in the heart, hila, or mediastinum. No pneumothorax. No overt edema. Mild bibasilar atelectasis. No other acute abnormalities. IMPRESSION: 1. Limited study due to the low volume portable technique. Within these limitations, there appears to be mild bibasilar atelectasis and no other change. 2. The torturous thoracic aorta is stable  radiographically. Electronically Signed   By: Dorise Bullion III M.D   On: 03/28/2018 14:55   Disposition   Pt is being discharged home today in good condition.  Follow-up Plans & Appointments     Contact information for follow-up providers    Consuelo Pandy, PA-C Follow up on 04/18/2018.   Specialties:  Cardiology, Radiology Why:  12:00 PM  Contact information: [place] [address] [phone]        [facility] Follow up.   Why:  They will do your home health care at your home Contact information: [address] [place] [address] [phone]            Contact information for after-discharge care    Destination    [facility] .   Service:  Skilled Nursing Contact information: [place] [phone]                 Discharge Instructions    Diet - low sodium heart healthy   Complete by:  As directed    Face-to-face encounter (required for Medicare/Medicaid patients)   Complete by:  As directed    I Lyda Jester certify that this patient is under my care and that I, or a nurse practitioner or physician's assistant working with me, had a face-to-face encounter that meets the physician face-to-face encounter requirements with this patient on 04/05/2018. The encounter with the patient was in whole, or in part for the following medical condition(s) which is the primary reason for home health care (List medical condition): chronic systolic HF   The encounter with the patient was in whole, or in part, for the following medical condition, which is the primary reason for home health care:  chronic systolic heart failure   I certify that, based on my findings, the following services are medically necessary home health services:   Nursing Physical therapy     Reason for Medically Necessary Home Health Services:  Therapy- Home Adaptation to  Facilitate Safety   My clinical findings support the need for the above services:  Unable to leave home safely without assistance and/or assistive device   Further, I certify that my clinical findings support that this patient is homebound due to:  Unable to leave home safely without assistance   For home use only DME 4 wheeled rolling walker with seat   Complete by:  As directed    Patient needs a walker to treat with the following condition:  Chronic systolic heart failure ([place])   Home Health   Complete by:  As directed    To provide the following care/treatments:   PT [place]     Increase activity slowly   Complete by:  As directed       Discharge Medications   Allergies as of 04/05/2018  Reactions   Ceclor [cefaclor]    Erythromycin       Medication List    STOP taking these medications   amLODipine 5 MG tablet Commonly known as:  NORVASC   lisinopril 5 MG tablet Commonly known as:  PRINIVIL,ZESTRIL   metoprolol tartrate 25 MG tablet Commonly known as:  LOPRESSOR     TAKE these medications   amiodarone 400 MG tablet Commonly known as:  PACERONE Take 1 tablet (400 mg total) by mouth daily. Start taking on:  04/06/2018   aspirin 81 MG EC tablet Take 1 tablet (81 mg total) by mouth daily. Start taking on:  04/06/2018   atorvastatin 10 MG tablet Commonly known as:  LIPITOR Take 1 tablet (10 mg total) by mouth daily.   levothyroxine 50 MCG tablet Commonly known as:  SYNTHROID, LEVOTHROID Take 50 mcg by mouth daily before breakfast.   LORazepam 0.5 MG tablet Commonly known as:  ATIVAN Take 0.5 mg by mouth daily as needed for anxiety.   metoprolol succinate 50 MG 24 hr tablet Commonly known as:  TOPROL-XL Take 1 tablet (50 mg total) by mouth 2 (two) times daily with a meal. Take with or immediately following a meal.   nitroGLYCERIN 0.4 MG SL tablet Commonly known as:  NITROSTAT Place 1 tablet (0.4 mg total) under the tongue every 5 (five)  minutes as needed for chest pain.   potassium chloride SA 20 MEQ tablet Commonly known as:  K-DUR,KLOR-CON Take 2 tablets (40 mEq total) by mouth 2 (two) times daily.   tamsulosin 0.4 MG Caps capsule Commonly known as:  FLOMAX Take 0.4 mg by mouth at bedtime.   traMADol 50 MG tablet Commonly known as:  ULTRAM Take 1 tablet (50 mg total) by mouth 2 (two) times daily as needed (for pain.). ADDITIONAL REFILLS FROM PRIMARY CARE PHYSICIAN What changed:  additional instructions   warfarin 5 MG tablet Commonly known as:  COUMADIN Take as directed. If you are unsure how to take this medication, talk to your nurse or doctor. Original instructions:  TAKE AS DIRECTED BY COUMADIN CLINIC What changed:  See the new instructions.            Durable Medical Equipment  (From admission, onward)         Start     Ordered   04/05/18 1150  For home use only DME Walker rolling  Once    Question:  Patient needs a walker to treat with the following condition  Answer:  VT (ventricular tachycardia) ([place])   04/05/18 1149   04/05/18 0000  For home use only DME 4 wheeled rolling walker with seat    Question:  Patient needs a walker to treat with the following condition  Answer:  Chronic systolic heart failure ([place])   04/05/18 1158           Acute coronary syndrome (MI, NSTEMI, STEMI, etc) this admission?:  No.  The elevated Troponin was due to the acute medical illness or demand ischemia.    Outstanding Labs/Studies   None   Duration of Discharge Encounter   Greater than 30 minutes including physician time.  Signed, Lyda Jester, PA-C 04/05/2018, 11:59 AM

## 2018-04-05 NOTE — Discharge Instructions (Signed)

## 2018-04-06 NOTE — Care Management (Signed)
04/06/18- Received call from pt's son.  He was not aware that home health agency was changed from [facility] to [facility] and patient is very anxious for [facility] services to start.  Son reports patient may not completely understand how to take medications and would like for a nurse to call to review.  Christopher Butler with [facility] will request that nurse make contact with patient this weekend.  SOC is scheduled for Monday morning.

## 2018-04-09 DIAGNOSIS — N182 Chronic kidney disease, stage 2 (mild): Secondary | ICD-10-CM | POA: Diagnosis not present

## 2018-04-09 DIAGNOSIS — I2510 Atherosclerotic heart disease of native coronary artery without angina pectoris: Secondary | ICD-10-CM | POA: Diagnosis not present

## 2018-04-09 DIAGNOSIS — I252 Old myocardial infarction: Secondary | ICD-10-CM | POA: Diagnosis not present

## 2018-04-09 DIAGNOSIS — I4821 Permanent atrial fibrillation: Secondary | ICD-10-CM | POA: Diagnosis not present

## 2018-04-09 DIAGNOSIS — I361 Nonrheumatic tricuspid (valve) insufficiency: Secondary | ICD-10-CM | POA: Diagnosis not present

## 2018-04-09 DIAGNOSIS — I771 Stricture of artery: Secondary | ICD-10-CM | POA: Diagnosis not present

## 2018-04-09 DIAGNOSIS — I130 Hypertensive heart and chronic kidney disease with heart failure and stage 1 through stage 4 chronic kidney disease, or unspecified chronic kidney disease: Secondary | ICD-10-CM | POA: Diagnosis not present

## 2018-04-09 DIAGNOSIS — I5022 Chronic systolic (congestive) heart failure: Secondary | ICD-10-CM | POA: Diagnosis not present

## 2018-04-09 DIAGNOSIS — I472 Ventricular tachycardia: Secondary | ICD-10-CM | POA: Diagnosis not present

## 2018-04-09 DIAGNOSIS — I429 Cardiomyopathy, unspecified: Secondary | ICD-10-CM | POA: Diagnosis not present

## 2018-04-10 DIAGNOSIS — I361 Nonrheumatic tricuspid (valve) insufficiency: Secondary | ICD-10-CM | POA: Diagnosis not present

## 2018-04-10 DIAGNOSIS — I472 Ventricular tachycardia: Secondary | ICD-10-CM | POA: Diagnosis not present

## 2018-04-10 DIAGNOSIS — I2510 Atherosclerotic heart disease of native coronary artery without angina pectoris: Secondary | ICD-10-CM | POA: Diagnosis not present

## 2018-04-10 DIAGNOSIS — I130 Hypertensive heart and chronic kidney disease with heart failure and stage 1 through stage 4 chronic kidney disease, or unspecified chronic kidney disease: Secondary | ICD-10-CM | POA: Diagnosis not present

## 2018-04-10 DIAGNOSIS — I429 Cardiomyopathy, unspecified: Secondary | ICD-10-CM | POA: Diagnosis not present

## 2018-04-10 DIAGNOSIS — N182 Chronic kidney disease, stage 2 (mild): Secondary | ICD-10-CM | POA: Diagnosis not present

## 2018-04-10 DIAGNOSIS — I4821 Permanent atrial fibrillation: Secondary | ICD-10-CM | POA: Diagnosis not present

## 2018-04-10 DIAGNOSIS — I5022 Chronic systolic (congestive) heart failure: Secondary | ICD-10-CM | POA: Diagnosis not present

## 2018-04-10 DIAGNOSIS — I252 Old myocardial infarction: Secondary | ICD-10-CM | POA: Diagnosis not present

## 2018-04-10 DIAGNOSIS — I771 Stricture of artery: Secondary | ICD-10-CM | POA: Diagnosis not present

## 2018-04-15 ENCOUNTER — Emergency Department (HOSPITAL_COMMUNITY): Payer: Medicare HMO

## 2018-04-15 ENCOUNTER — Inpatient Hospital Stay (HOSPITAL_COMMUNITY)
Admission: EM | Admit: 2018-04-15 | Discharge: 2018-04-18 | DRG: 292 | Disposition: A | Discharge status: Home Health | Payer: Medicare HMO | Attending: Family Medicine | Admitting: Family Medicine

## 2018-04-15 ENCOUNTER — Encounter (HOSPITAL_COMMUNITY): Payer: Self-pay | Admitting: Emergency Medicine

## 2018-04-15 ENCOUNTER — Other Ambulatory Visit: Payer: Self-pay

## 2018-04-15 DIAGNOSIS — I4821 Permanent atrial fibrillation: Secondary | ICD-10-CM | POA: Diagnosis not present

## 2018-04-15 DIAGNOSIS — I447 Left bundle-branch block, unspecified: Secondary | ICD-10-CM | POA: Diagnosis not present

## 2018-04-15 DIAGNOSIS — Z7901 Long term (current) use of anticoagulants: Secondary | ICD-10-CM

## 2018-04-15 DIAGNOSIS — E039 Hypothyroidism, unspecified: Secondary | ICD-10-CM | POA: Diagnosis not present

## 2018-04-15 DIAGNOSIS — E871 Hypo-osmolality and hyponatremia: Secondary | ICD-10-CM | POA: Diagnosis present

## 2018-04-15 DIAGNOSIS — R791 Abnormal coagulation profile: Secondary | ICD-10-CM | POA: Diagnosis present

## 2018-04-15 DIAGNOSIS — M5135 Other intervertebral disc degeneration, thoracolumbar region: Secondary | ICD-10-CM | POA: Diagnosis present

## 2018-04-15 DIAGNOSIS — I10 Essential (primary) hypertension: Secondary | ICD-10-CM | POA: Diagnosis present

## 2018-04-15 DIAGNOSIS — I110 Hypertensive heart disease with heart failure: Principal | ICD-10-CM | POA: Diagnosis present

## 2018-04-15 DIAGNOSIS — Z66 Do not resuscitate: Secondary | ICD-10-CM | POA: Diagnosis not present

## 2018-04-15 DIAGNOSIS — R64 Cachexia: Secondary | ICD-10-CM | POA: Diagnosis present

## 2018-04-15 DIAGNOSIS — I472 Ventricular tachycardia: Secondary | ICD-10-CM | POA: Diagnosis not present

## 2018-04-15 DIAGNOSIS — Z9089 Acquired absence of other organs: Secondary | ICD-10-CM

## 2018-04-15 DIAGNOSIS — E785 Hyperlipidemia, unspecified: Secondary | ICD-10-CM | POA: Diagnosis present

## 2018-04-15 DIAGNOSIS — J80 Acute respiratory distress syndrome: Secondary | ICD-10-CM | POA: Diagnosis not present

## 2018-04-15 DIAGNOSIS — R079 Chest pain, unspecified: Secondary | ICD-10-CM | POA: Diagnosis not present

## 2018-04-15 DIAGNOSIS — Z881 Allergy status to other antibiotic agents status: Secondary | ICD-10-CM

## 2018-04-15 DIAGNOSIS — J9601 Acute respiratory failure with hypoxia: Secondary | ICD-10-CM | POA: Diagnosis not present

## 2018-04-15 DIAGNOSIS — Z7189 Other specified counseling: Secondary | ICD-10-CM

## 2018-04-15 DIAGNOSIS — H9190 Unspecified hearing loss, unspecified ear: Secondary | ICD-10-CM | POA: Diagnosis present

## 2018-04-15 DIAGNOSIS — Z9049 Acquired absence of other specified parts of digestive tract: Secondary | ICD-10-CM

## 2018-04-15 DIAGNOSIS — J439 Emphysema, unspecified: Secondary | ICD-10-CM | POA: Diagnosis present

## 2018-04-15 DIAGNOSIS — I4891 Unspecified atrial fibrillation: Secondary | ICD-10-CM | POA: Diagnosis not present

## 2018-04-15 DIAGNOSIS — Z79899 Other long term (current) drug therapy: Secondary | ICD-10-CM

## 2018-04-15 DIAGNOSIS — I252 Old myocardial infarction: Secondary | ICD-10-CM

## 2018-04-15 DIAGNOSIS — E7800 Pure hypercholesterolemia, unspecified: Secondary | ICD-10-CM | POA: Diagnosis present

## 2018-04-15 DIAGNOSIS — I5023 Acute on chronic systolic (congestive) heart failure: Secondary | ICD-10-CM | POA: Diagnosis not present

## 2018-04-15 DIAGNOSIS — R0789 Other chest pain: Secondary | ICD-10-CM | POA: Diagnosis not present

## 2018-04-15 DIAGNOSIS — Z9842 Cataract extraction status, left eye: Secondary | ICD-10-CM

## 2018-04-15 DIAGNOSIS — Z9841 Cataract extraction status, right eye: Secondary | ICD-10-CM

## 2018-04-15 DIAGNOSIS — N179 Acute kidney failure, unspecified: Secondary | ICD-10-CM | POA: Diagnosis present

## 2018-04-15 DIAGNOSIS — Z515 Encounter for palliative care: Secondary | ICD-10-CM | POA: Diagnosis not present

## 2018-04-15 DIAGNOSIS — R0602 Shortness of breath: Secondary | ICD-10-CM | POA: Diagnosis not present

## 2018-04-15 DIAGNOSIS — Z720 Tobacco use: Secondary | ICD-10-CM | POA: Diagnosis present

## 2018-04-15 DIAGNOSIS — Z79891 Long term (current) use of opiate analgesic: Secondary | ICD-10-CM

## 2018-04-15 DIAGNOSIS — Z79890 Hormone replacement therapy (postmenopausal): Secondary | ICD-10-CM

## 2018-04-15 DIAGNOSIS — T501X5A Adverse effect of loop [high-ceiling] diuretics, initial encounter: Secondary | ICD-10-CM | POA: Diagnosis not present

## 2018-04-15 DIAGNOSIS — I2510 Atherosclerotic heart disease of native coronary artery without angina pectoris: Secondary | ICD-10-CM | POA: Diagnosis present

## 2018-04-15 DIAGNOSIS — F17210 Nicotine dependence, cigarettes, uncomplicated: Secondary | ICD-10-CM | POA: Diagnosis present

## 2018-04-15 DIAGNOSIS — N400 Enlarged prostate without lower urinary tract symptoms: Secondary | ICD-10-CM | POA: Diagnosis present

## 2018-04-15 DIAGNOSIS — Y92230 Patient room in hospital as the place of occurrence of the external cause: Secondary | ICD-10-CM | POA: Diagnosis not present

## 2018-04-15 DIAGNOSIS — Z6824 Body mass index (BMI) 24.0-24.9, adult: Secondary | ICD-10-CM

## 2018-04-15 DIAGNOSIS — I081 Rheumatic disorders of both mitral and tricuspid valves: Secondary | ICD-10-CM | POA: Diagnosis present

## 2018-04-15 DIAGNOSIS — M419 Scoliosis, unspecified: Secondary | ICD-10-CM | POA: Diagnosis present

## 2018-04-15 DIAGNOSIS — Z7982 Long term (current) use of aspirin: Secondary | ICD-10-CM

## 2018-04-15 HISTORY — DX: Headache, unspecified: R51.9

## 2018-04-15 HISTORY — DX: Unspecified osteoarthritis, unspecified site: M19.90

## 2018-04-15 HISTORY — DX: Other chronic pain: G89.29

## 2018-04-15 HISTORY — DX: Low back pain: M54.5

## 2018-04-15 HISTORY — DX: Low back pain, unspecified: M54.50

## 2018-04-15 HISTORY — DX: Anxiety disorder, unspecified: F41.9

## 2018-04-15 HISTORY — DX: Headache: R51

## 2018-04-15 LAB — CBC WITH DIFFERENTIAL/PLATELET
Abs Immature Granulocytes: 0.07 10*3/uL (ref 0.00–0.07)
Basophils Absolute: 0 10*3/uL (ref 0.0–0.1)
Basophils Relative: 0 %
Eosinophils Absolute: 0 10*3/uL (ref 0.0–0.5)
Eosinophils Relative: 0 %
HCT: 36 % — ABNORMAL LOW (ref 39.0–52.0)
Hemoglobin: 11.5 g/dL — ABNORMAL LOW (ref 13.0–17.0)
Immature Granulocytes: 1 %
Lymphocytes Relative: 10 %
Lymphs Abs: 1.2 10*3/uL (ref 0.7–4.0)
MCH: 31.8 pg (ref 26.0–34.0)
MCHC: 31.9 g/dL (ref 30.0–36.0)
MCV: 99.4 fL (ref 80.0–100.0)
Monocytes Absolute: 1.1 10*3/uL — ABNORMAL HIGH (ref 0.1–1.0)
Monocytes Relative: 10 %
Neutro Abs: 9 10*3/uL — ABNORMAL HIGH (ref 1.7–7.7)
Neutrophils Relative %: 79 %
Platelets: 252 10*3/uL (ref 150–400)
RBC: 3.62 MIL/uL — ABNORMAL LOW (ref 4.22–5.81)
RDW: 13.9 % (ref 11.5–15.5)
WBC: 11.3 10*3/uL — ABNORMAL HIGH (ref 4.0–10.5)
nRBC: 0 % (ref 0.0–0.2)

## 2018-04-15 LAB — COMPREHENSIVE METABOLIC PANEL
ALT: 26 U/L (ref 0–44)
AST: 23 U/L (ref 15–41)
Albumin: 2.9 g/dL — ABNORMAL LOW (ref 3.5–5.0)
Alkaline Phosphatase: 121 U/L (ref 38–126)
Anion gap: 15 (ref 5–15)
BUN: 31 mg/dL — ABNORMAL HIGH (ref 8–23)
CO2: 17 mmol/L — ABNORMAL LOW (ref 22–32)
CREATININE: 1.24 mg/dL (ref 0.61–1.24)
Calcium: 8.6 mg/dL — ABNORMAL LOW (ref 8.9–10.3)
Chloride: 98 mmol/L (ref 98–111)
GFR calc Af Amer: 59 mL/min — ABNORMAL LOW (ref >60)
GFR calc non Af Amer: 51 mL/min — ABNORMAL LOW (ref >60)
Glucose, Bld: 125 mg/dL — ABNORMAL HIGH (ref 70–99)
Potassium: 5 mmol/L (ref 3.5–5.1)
Sodium: 130 mmol/L — ABNORMAL LOW (ref 135–145)
TOTAL PROTEIN: 6.1 g/dL — AB (ref 6.5–8.1)
Total Bilirubin: 1.7 mg/dL — ABNORMAL HIGH (ref 0.3–1.2)

## 2018-04-15 LAB — PROTIME-INR
INR: 4.95
Prothrombin Time: 45.3 seconds — ABNORMAL HIGH (ref 11.4–15.2)

## 2018-04-15 LAB — TROPONIN I
Troponin I: <0.03 ng/mL (ref <0.03)
Troponin I: <0.03 ng/mL (ref <0.03)

## 2018-04-15 LAB — BRAIN NATRIURETIC PEPTIDE: B Natriuretic Peptide: 856.7 pg/mL — ABNORMAL HIGH (ref 0.0–100.0)

## 2018-04-15 MED ORDER — LORAZEPAM 0.5 MG PO TABS
0.5000 mg | ORAL_TABLET | Freq: Every day | ORAL | Status: DC | PRN
  Administered 2018-04-15 – 2018-04-17 (×3): 0.5 mg via ORAL
  Filled 2018-04-15 (×3): qty 1

## 2018-04-15 MED ORDER — SODIUM CHLORIDE 0.9% FLUSH
3.0000 mL | INTRAVENOUS | Status: DC | PRN
  Administered 2018-04-17: 3 mL via INTRAVENOUS
  Filled 2018-04-15: qty 3

## 2018-04-15 MED ORDER — FUROSEMIDE 10 MG/ML IJ SOLN
20.0000 mg | Freq: Once | INTRAMUSCULAR | Status: AC
  Administered 2018-04-15: 20 mg via INTRAVENOUS
  Filled 2018-04-15: qty 2

## 2018-04-15 MED ORDER — ATORVASTATIN CALCIUM 10 MG PO TABS
10.0000 mg | ORAL_TABLET | Freq: Every day | ORAL | Status: DC
  Administered 2018-04-15 – 2018-04-18 (×4): 10 mg via ORAL
  Filled 2018-04-15 (×4): qty 1

## 2018-04-15 MED ORDER — ACETAMINOPHEN 325 MG PO TABS: 650.0000 mg | ORAL_TABLET | ORAL | Status: DC | PRN

## 2018-04-15 MED ORDER — SODIUM CHLORIDE 0.9% FLUSH
3.0000 mL | Freq: Two times a day (BID) | INTRAVENOUS | Status: DC
  Administered 2018-04-15 – 2018-04-17 (×5): 3 mL via INTRAVENOUS

## 2018-04-15 MED ORDER — AMIODARONE HCL 200 MG PO TABS
400.0000 mg | ORAL_TABLET | Freq: Every day | ORAL | Status: DC
  Administered 2018-04-15 – 2018-04-16 (×2): 400 mg via ORAL
  Filled 2018-04-15 (×2): qty 2

## 2018-04-15 MED ORDER — POLYETHYLENE GLYCOL 3350 17 G PO PACK
17.0000 g | PACK | Freq: Every day | ORAL | Status: DC
  Administered 2018-04-16 – 2018-04-17 (×2): 17 g via ORAL
  Filled 2018-04-15 (×3): qty 1

## 2018-04-15 MED ORDER — ASPIRIN EC 81 MG PO TBEC
81.0000 mg | DELAYED_RELEASE_TABLET | Freq: Every day | ORAL | Status: DC
  Administered 2018-04-15 – 2018-04-18 (×4): 81 mg via ORAL
  Filled 2018-04-15 (×4): qty 1

## 2018-04-15 MED ORDER — WARFARIN - PHARMACIST DOSING INPATIENT: Freq: Every day | Status: DC

## 2018-04-15 MED ORDER — SODIUM CHLORIDE 0.9 % IV SOLN: 250.0000 mL | INTRAVENOUS | Status: DC | PRN

## 2018-04-15 MED ORDER — BISACODYL 5 MG PO TBEC
5.0000 mg | DELAYED_RELEASE_TABLET | Freq: Every day | ORAL | Status: DC | PRN
  Administered 2018-04-15: 5 mg via ORAL
  Filled 2018-04-15: qty 1

## 2018-04-15 MED ORDER — METOPROLOL SUCCINATE ER 25 MG PO TB24
50.0000 mg | ORAL_TABLET | Freq: Two times a day (BID) | ORAL | Status: DC
  Administered 2018-04-15 – 2018-04-18 (×7): 50 mg via ORAL
  Filled 2018-04-15 (×6): qty 2

## 2018-04-15 MED ORDER — NICOTINE 14 MG/24HR TD PT24
14.0000 mg | MEDICATED_PATCH | Freq: Every day | TRANSDERMAL | Status: DC
  Administered 2018-04-15 – 2018-04-18 (×4): 14 mg via TRANSDERMAL
  Filled 2018-04-15 (×4): qty 1

## 2018-04-15 MED ORDER — TRAMADOL HCL 50 MG PO TABS
50.0000 mg | ORAL_TABLET | Freq: Two times a day (BID) | ORAL | Status: DC | PRN
  Administered 2018-04-17: 50 mg via ORAL
  Filled 2018-04-15: qty 1

## 2018-04-15 MED ORDER — DOCUSATE SODIUM 100 MG PO CAPS
100.0000 mg | ORAL_CAPSULE | Freq: Two times a day (BID) | ORAL | Status: DC
  Administered 2018-04-15 – 2018-04-18 (×6): 100 mg via ORAL
  Filled 2018-04-15 (×6): qty 1

## 2018-04-15 MED ORDER — LEVOTHYROXINE SODIUM 50 MCG PO TABS
50.0000 ug | ORAL_TABLET | Freq: Every day | ORAL | Status: DC
  Administered 2018-04-17 – 2018-04-18 (×2): 50 ug via ORAL
  Filled 2018-04-15 (×2): qty 1

## 2018-04-15 MED ORDER — ONDANSETRON HCL 4 MG/2ML IJ SOLN: 4.0000 mg | Freq: Four times a day (QID) | INTRAMUSCULAR | Status: DC | PRN

## 2018-04-15 MED ORDER — FUROSEMIDE 10 MG/ML IJ SOLN
40.0000 mg | Freq: Two times a day (BID) | INTRAMUSCULAR | Status: DC
  Administered 2018-04-15 – 2018-04-17 (×4): 40 mg via INTRAVENOUS
  Filled 2018-04-15 (×4): qty 4

## 2018-04-15 MED ORDER — TAMSULOSIN HCL 0.4 MG PO CAPS
0.4000 mg | ORAL_CAPSULE | Freq: Every day | ORAL | Status: DC
  Administered 2018-04-15 – 2018-04-17 (×3): 0.4 mg via ORAL
  Filled 2018-04-15 (×3): qty 1

## 2018-04-15 NOTE — ED Notes (Signed)
Heart Healthy Diet was ordered for Lunch. 

## 2018-04-15 NOTE — ED Notes (Signed)
Condom Cath was placed on patient.

## 2018-04-15 NOTE — ED Triage Notes (Addendum)
Pt in from [place] via [place] with c/o increased sob x 1wk. Has hx of CHF, states he was having trouble going to sleep tonight, needed 3 pillows to breathe lying. Sats were 83% on RA when EMS arrived, placed on 3L [place] and sats now 94%. Also c/o chest dull ache - given 324 ASA and 1 NTG, relief of pain afterward. Also c/o BLE swelling.

## 2018-04-15 NOTE — Progress Notes (Signed)
ANTICOAGULATION CONSULT NOTE - Initial Consult  Pharmacy Consult for warfarin Indication: atrial fibrillation   Patient Measurements: Weight: 140 lb 14 oz (63.9 kg)   Vital Signs: Temp: 97.6 F (36.4 C) (12/16 0416) Temp Source: Oral (12/16 0416) BP: 141/84 (12/16 0830) Pulse Rate: 69 (12/16 0830)  Labs: Recent Labs    04/15/18 0415  HGB 11.5*  HCT 36.0*  PLT 252  LABPROT 45.3*  INR 4.95*  CREATININE 1.24  TROPONINI <0.03    Estimated Creatinine Clearance: 31.2 mL/min (by C-G formula based on SCr of 1.24 mg/dL).   Medical History: Past Medical History:  Diagnosis Date  . Acute myocardial infarction 1987   s/p TPA  . CAD (coronary artery disease)    a. remote inferior wall myocardial infarction (status post streptokinase in 1987-did not require angioplasty).  . Cholelithiases   . Chronic systolic CHF (congestive heart failure) ([place])   . CKD (chronic kidney disease), stage II   . COPD (chronic obstructive pulmonary disease) ([place])   . Disc disease, degenerative, thoracic or thoracolumbar   . ED (erectile dysfunction)   . Emphysema   . HTN (hypertension)   . Hypercholesteremia   . Hypertensive heart disease with chronic systolic congestive heart failure ([place])   . Hyponatremia Sept 2010  . Hypothyroidism   . Permanent atrial fibrillation   . Sleep apnea   . Tobacco abuse   . Tricuspid regurgitation      Assessment: 84 yo male admitted with SOB, dull chest pain.   On warfarin PTA for AFib. INR 4.95. Last dose 12/15. PTA warfarin: 5 mg on Mon/Wed/Fri, 2.5 mg on all other days.    Goal of Therapy:  Heparin level 0.3-0.7 units/ml Monitor platelets by anticoagulation protocol: Yes    Plan:  -Hold warfarin today -Daily INR   Maejor Erven, Jake Church 04/15/2018,10:49 AM

## 2018-04-15 NOTE — H&P (Signed)
History and Physical    Christopher Butler XLK:[id number] DOB: 1925-03-18 DOA: 04/15/2018  PCP: Lajean Manes, MD Consultants:  Nahser - cardiology Patient coming from:  Home - lives with wife; NOK: Daughter  Chief Complaint:  SOB and CP  HPI: Christopher Butler is a 84 y.o. male with medical history significant of acute myocardial infarction treated with thrombolytics in 3664, chronic systolic heart failure with LVEF 35%, permanent atrial fibrillation, history of COPD, hypertension and hypercholesterolemia, hypothyroidism who was admitted from 11/28- 12/6 for vtach.  He presented today with SOB and CP.  Patient developed SOB and aching in his chest.  He felt like he was going to quit breathing - he would take a little snooze and wake up like he was stopping breathing.  +PND.  Mild orthopnea.  He noticed onset of symptoms shortly after his last discharge and it progressed.  He thinks he fell asleep and was breathing fast but had been feeling better while in the hospital.  +cough, occasionally productive of light-colored sputum.  +night sweats, uncertain about fevers.   ED Course: Carryover, per Dr. Alcario Drought:  84 yo M with worsening CHF symptoms over past week. Recently admitted to [facility] for new onset V.Tach. Also has INR of 5. 3L 138/82. No CP currently. Not on outpatient diuretics despite 20-25% - likely because of orthostatic hypotension during last hospitalization. Not AICD candidate due to age.  Got 20 lasix in ED.   Review of Systems: As per HPI; otherwise review of systems reviewed and negative.   Ambulatory Status:  Ambulated without assistance until last hospitalization, walker since then  Past Medical History:  Diagnosis Date  . Acute myocardial infarction 1987   s/p TPA  . Anxiety   . Arthritis    "probably in my back" (04/15/2018)  . CAD (coronary artery disease)    a. remote inferior wall myocardial infarction (status post streptokinase in 1987-did not require angioplasty).   . Cholelithiases   . Chronic lower back pain   . Chronic systolic CHF (congestive heart failure) ([place])   . CKD (chronic kidney disease), stage II   . COPD (chronic obstructive pulmonary disease) ([place])   . Disc disease, degenerative, thoracic or thoracolumbar   . ED (erectile dysfunction)   . Emphysema   . Headache    "usually 2/wk" (04/15/2018)  . HTN (hypertension)   . Hypercholesteremia   . Hypertensive heart disease with chronic systolic congestive heart failure ([place])   . Hyponatremia Sept 2010  . Hypothyroidism   . Permanent atrial fibrillation   . Sleep apnea    pt denies this hx on 04/15/2018  . Tobacco abuse   . Tricuspid regurgitation     Past Surgical History:  Procedure Laterality Date  . APPENDECTOMY    . CARDIAC CATHETERIZATION  1987  . CATARACT EXTRACTION, BILATERAL Bilateral ~ 2018  . TONSILLECTOMY      Social History   Socioeconomic History  . Marital status: Married    Spouse name: Not on file  . Number of children: Not on file  . Years of education: Not on file  . Highest education level: Not on file  Occupational History  . Not on file  Social Needs  . Financial resource strain: Not on file  . Food insecurity:    Worry: Not on file    Inability: Not on file  . Transportation needs:    Medical: Not on file    Non-medical: Not on file  Tobacco Use  . Smoking  status: Current Every Day Smoker    Packs/day: 0.50    Years: 70.00    Pack years: 35.00  . Smokeless tobacco: Never Used  Substance and Sexual Activity  . Alcohol use: Yes    Comment: 04/15/2018 couple of drinks most nights prior to Thanksgiving 2019"; nothing since  . Drug use: No  . Sexual activity: Not Currently  Lifestyle  . Physical activity:    Days per week: Not on file    Minutes per session: Not on file  . Stress: Not on file  Relationships  . Social connections:    Talks on phone: Not on file    Gets together: Not on file    Attends religious service: Not on file     Active member of club or organization: Not on file    Attends meetings of clubs or organizations: Not on file    Relationship status: Not on file  . Intimate partner violence:    Fear of current or ex partner: Not on file    Emotionally abused: Not on file    Physically abused: Not on file    Forced sexual activity: Not on file  Other Topics Concern  . Not on file  Social History Narrative  . Not on file    Allergies  Allergen Reactions  . Ceclor [Cefaclor]   . Erythromycin     Family History  Family history unknown: Yes    Prior to Admission medications   Medication Sig Start Date End Date Taking? Authorizing Provider  amiodarone (PACERONE) 400 MG tablet Take 1 tablet (400 mg total) by mouth daily. 04/06/18  Yes Lyda Jester M, PA-C  aspirin EC 81 MG EC tablet Take 1 tablet (81 mg total) by mouth daily. 04/06/18  Yes Lyda Jester M, PA-C  atorvastatin (LIPITOR) 10 MG tablet Take 1 tablet (10 mg total) by mouth daily. 06/26/17  Yes Dunn, Dayna N, PA-C  levothyroxine (SYNTHROID, LEVOTHROID) 50 MCG tablet Take 50 mcg by mouth daily before breakfast.   Yes [provider]  LORazepam (ATIVAN) 0.5 MG tablet Take 0.5 mg by mouth daily as needed for anxiety.   Yes [provider]  metoprolol succinate (TOPROL-XL) 50 MG 24 hr tablet Take 1 tablet (50 mg total) by mouth 2 (two) times daily with a meal. Take with or immediately following a meal. 04/05/18  Yes Lyda Jester M, PA-C  nitroGLYCERIN (NITROSTAT) 0.4 MG SL tablet Place 1 tablet (0.4 mg total) under the tongue every 5 (five) minutes as needed for chest pain. 04/05/18  Yes Lyda Jester M, PA-C  potassium chloride SA (K-DUR,KLOR-CON) 20 MEQ tablet Take 2 tablets (40 mEq total) by mouth 2 (two) times daily. 04/05/18  Yes Lyda Jester M, PA-C  tamsulosin (FLOMAX) 0.4 MG CAPS capsule Take 0.4 mg by mouth at bedtime.   Yes [provider]  traMADol (ULTRAM) 50 MG tablet Take 1 tablet (50  mg total) by mouth 2 (two) times daily as needed (for pain.). ADDITIONAL REFILLS FROM PRIMARY CARE PHYSICIAN Patient taking differently: Take 50 mg by mouth 2 (two) times daily as needed (for pain.).  06/04/15  Yes Darlin Coco, MD  warfarin (COUMADIN) 5 MG tablet TAKE AS DIRECTED BY COUMADIN CLINIC Patient taking differently: Take 2.5-5 mg by mouth See admin instructions. TAKE 5 mg on mon. Wed. Fri.  Take 2.5 mg tues thurs,fri. sat and sun 12/24/17  Yes Nahser, Wonda Cheng, MD    Physical Exam: Vitals:   04/15/18 1345 04/15/18 1348  04/15/18 1413 04/15/18 1551  BP:   119/67 118/78  Pulse: 64 71 68 61  Resp: 16 (!) 24 (!) 23 (!) 22  Temp:   97.6 F (36.4 C) 97.6 F (36.4 C)  TempSrc:   Oral Oral  SpO2: 91% 92% 93% 95%  Weight:         General:  Appears calm and comfortable and is NAD Eyes:  PERRL, EOMI, normal lids, iris ENT:  Hard of hearing, normal lips & tongue, mmm; artificial dentition Neck:  no LAD, masses or thyromegaly Cardiovascular:  Irregularly irregular rhythm with controlled rate, no m/r/g. 1-2+ LE edema.  Respiratory:  Scant bibasilar crackles.  Normal respiratory effort. Abdomen:  soft, NT, ND, NABS Back:   normal alignment, no CVAT Skin:  no rash or induration seen on limited exam Musculoskeletal:  grossly normal tone BUE/BLE, good ROM, no bony abnormality Lower extremity:  1-2+ LE edema.  Limited foot exam with no ulcerations.  2+ distal pulses. Psychiatric:  grossly normal mood and affect, speech fluent and appropriate, AOx3 Neurologic:  CN 2-12 grossly intact, moves all extremities in coordinated fashion, sensation intact    Radiological Exams on Admission: Dg Chest Portable 1 View  Result Date: 04/15/2018 CLINICAL DATA:  Chest pain tonight. EXAM: PORTABLE CHEST 1 VIEW COMPARISON:  Radiograph 03/29/2018 FINDINGS: Unchanged cardiomegaly with aortic atherosclerosis and tortuosity. Progression in reticular opacities consistent with pulmonary edema. Blunting of  the costophrenic angles appears similar to prior exam. No new airspace disease or pneumothorax. Unchanged osseous structures with significant scoliotic curvature. IMPRESSION: Progressive pulmonary edema since last month. Unchanged cardiomegaly and aortic atherosclerosis. Aortic Atherosclerosis (ICD10-I70.0). Electronically Signed   By: Keith Rake M.D.   On: 04/15/2018 04:32    EKG: Independently reviewed.  Afib with rate 73; LBBB with no evidence of acute ischemia   Labs on Admission: I have personally reviewed the available labs and imaging studies at the time of the admission.  Pertinent labs:   Na++ 130 CO2 17; 20 on 12/6 Glucose 125 BUN 31/Creatinine 1.24/GFR 51 - improved Albumin 2.9 Troponin <0.03 WBC 11.3; 13.2 on 12/4 Hgb 11.5 INR 4.95  Assessment/Plan Principal Problem:   Acute on chronic systolic CHF (congestive heart failure) (HCC) Active Problems:   Tobacco abuse   Hypercholesterolemia   Permanent atrial fibrillation   Essential hypertension   Acquired hypothyroidism   Acute on chronic systolic CHF -Patient with recent admission for vtach and known EF 20-25% as of 11/29 presenting with worsening SOB and hypoxia  -CXR consistent with pulmonary edema -BNP pending -He had issues with orthostatic hypotension during his hospitalization and so had some med changes including d/c lisinopril and amlodipine, and apparently holding home diuretics -Was given 20 mg Lasix in the ER; will treat with 40 mg IV BID for now -Will place in observation status with telemetry -Continue [place] O2 prn for now -Normal/improved kidney function at this time, will follow -Repeat EKG in AM -Will r/o with serial troponins although doubt ACS based on symptoms -Consider cardiology consult if not improving  HTN -Continue Toprol   HLD -Continue Lipitor  Afib -Rate controlled with Amiodarone and Toprol XL -Continue Coumadin, pharmacy to dose  Hypothyroidism -Recent normal  TSH -Continue Synthroid at current dose for now  Tobacco dependence -Encourage cessation.   -This was discussed with the patient and should be reviewed on an ongoing basis.   -Patch ordered at patient request.    DVT prophylaxis: Coumadin Code Status:  Full - confirmed with patient Family  Communication: None present Disposition Plan:  Home once clinically improved Consults called: CM, PT  Admission status: It is my clinical opinion that referral for OBSERVATION is reasonable and necessary in this patient based on the above information provided. The aforementioned taken together are felt to place the patient at high risk for further clinical deterioration. However it is anticipated that the patient may be medically stable for discharge from the hospital within 24 to 48 hours.    Karmen Bongo MD [facility]  If note is complete, please contact covering daytime or nighttime physician. [email or web address] Password [email or web address]  04/15/2018, 5:15 PM

## 2018-04-15 NOTE — Progress Notes (Signed)
Pt states he has not had a BM in 4/5 days. States he took some ex-lax at home but it did not work. Pt is asking for something to help him go. No prns ordered. Paged primary team for orders.

## 2018-04-15 NOTE — ED Provider Notes (Signed)
[facility] EMERGENCY DEPARTMENT Provider Note   CSN: [id number] Arrival date & time: 04/15/18  0355     History   Chief Complaint Chief Complaint  Patient presents with  . Shortness of Breath  . Chest Pain    HPI Christopher Butler is a 84 y.o. male.  Patient with history of CAD, CHF, not on diuretics, COPD, CKD, recent admission for ventricular tachycardia presenting from assisted living facility with a one-week history of progressively worsening shortness of breath that is worse at night.  Patient states he is been having to sleep propped up and not able to sleep for the past several nights which is unusual for him.  Has not had much difficulty breathing during the day.  EMS reports initial O2 saturation was 83% on room air.  States cough productive of clear mucus.  Has had increased pain and swelling to his legs as well.  Had episode of chest pain has been intermittent over the past week.  Central chest pain does not radiate the last for several minutes to hours at a time.  Is not have any chest pain currently.  States he came in tonight because he was having difficulty sleeping and felt that his breathing was getting worse than usual.  The history is provided by the patient and the EMS personnel.  Shortness of Breath  Associated symptoms include cough, chest pain and leg swelling. Pertinent negatives include no fever, no headaches, no rhinorrhea, no vomiting, no abdominal pain and no rash.  Chest Pain   Associated symptoms include cough and shortness of breath. Pertinent negatives include no abdominal pain, no dizziness, no fever, no headaches, no nausea, no vomiting and no weakness.    Past Medical History:  Diagnosis Date  . Acute myocardial infarction 1987   s/p TPA  . CAD (coronary artery disease)    a. remote inferior wall myocardial infarction (status post streptokinase in 1987-did not require angioplasty).  . Cholelithiases   . Chronic systolic CHF  (congestive heart failure) ([place])   . CKD (chronic kidney disease), stage II   . COPD (chronic obstructive pulmonary disease) ([place])   . Disc disease, degenerative, thoracic or thoracolumbar   . ED (erectile dysfunction)   . Emphysema   . HTN (hypertension)   . Hypercholesteremia   . Hypertensive heart disease with chronic systolic congestive heart failure ([place])   . Hyponatremia Sept 2010  . Hypothyroidism   . Permanent atrial fibrillation   . Sleep apnea   . Tobacco abuse   . Tricuspid regurgitation     Patient Active Problem List   Diagnosis Date Noted  . Ventricular tachycardia ([place]) 03/28/2018  . Chronic systolic CHF (congestive heart failure) ([place]) 09/20/2016  . Coronary artery disease involving native coronary artery of native heart without angina pectoris 03/27/2016  . Herpes zoster 12/17/2013  . Encounter for therapeutic drug monitoring 05/30/2013  . Tinnitus 12/20/2012  . Hyposmolality and/or hyponatremia 12/20/2012  . Tinea corporis 04/17/2012  . Permanent atrial fibrillation 10/24/2011  . Ischemic heart disease 08/03/2010  . Tobacco abuse 08/03/2010  . Hypertensive heart disease with CHF (congestive heart failure) ([place]) 08/03/2010  . Hypercholesterolemia 08/03/2010  . Erectile dysfunction 08/03/2010  . Low back pain 08/03/2010  . Cholelithiasis 08/03/2010    Past Surgical History:  Procedure Laterality Date  . APPENDECTOMY    . CARDIAC CATHETERIZATION    . TONSILLECTOMY          Home Medications    Prior to Admission  medications   Medication Sig Start Date End Date Taking? Authorizing Provider  amiodarone (PACERONE) 400 MG tablet Take 1 tablet (400 mg total) by mouth daily. 04/06/18   Consuelo Pandy, PA-C  aspirin EC 81 MG EC tablet Take 1 tablet (81 mg total) by mouth daily. 04/06/18   Lyda Jester M, PA-C  atorvastatin (LIPITOR) 10 MG tablet Take 1 tablet (10 mg total) by mouth daily. 06/26/17   Dunn, Nedra Hai, PA-C  levothyroxine (SYNTHROID,  LEVOTHROID) 50 MCG tablet Take 50 mcg by mouth daily before breakfast.    [provider]  LORazepam (ATIVAN) 0.5 MG tablet Take 0.5 mg by mouth daily as needed for anxiety.    [provider]  metoprolol succinate (TOPROL-XL) 50 MG 24 hr tablet Take 1 tablet (50 mg total) by mouth 2 (two) times daily with a meal. Take with or immediately following a meal. 04/05/18   Lyda Jester M, PA-C  nitroGLYCERIN (NITROSTAT) 0.4 MG SL tablet Place 1 tablet (0.4 mg total) under the tongue every 5 (five) minutes as needed for chest pain. 04/05/18   Lyda Jester M, PA-C  potassium chloride SA (K-DUR,KLOR-CON) 20 MEQ tablet Take 2 tablets (40 mEq total) by mouth 2 (two) times daily. 04/05/18   Lyda Jester M, PA-C  tamsulosin (FLOMAX) 0.4 MG CAPS capsule Take 0.4 mg by mouth at bedtime.    [provider]  traMADol (ULTRAM) 50 MG tablet Take 1 tablet (50 mg total) by mouth 2 (two) times daily as needed (for pain.). ADDITIONAL REFILLS FROM PRIMARY CARE PHYSICIAN Patient taking differently: Take 50 mg by mouth 2 (two) times daily as needed (for pain.).  06/04/15   Darlin Coco, MD  warfarin (COUMADIN) 5 MG tablet TAKE AS DIRECTED BY COUMADIN CLINIC Patient taking differently: Take 2.5-5 mg by mouth See admin instructions. TAKE 5 mg on mon. Wed. Fri.  Take 2.5 mg tues thurs,fri. dat and sun 12/24/17   Nahser, Wonda Cheng, MD    Family History Family History  Family history unknown: Yes    Social History Social History   Tobacco Use  . Smoking status: Current Some Day Smoker    Packs/day: 1.00  . Smokeless tobacco: Never Used  Substance Use Topics  . Alcohol use: Yes  . Drug use: No     Allergies   Ceclor [cefaclor] and Erythromycin   Review of Systems Review of Systems  Constitutional: Negative for activity change, appetite change and fever.  HENT: Negative for congestion and rhinorrhea.   Eyes: Negative for visual disturbance.  Respiratory: Positive for  cough, chest tightness and shortness of breath.   Cardiovascular: Positive for chest pain and leg swelling.  Gastrointestinal: Negative for abdominal pain, nausea and vomiting.  Genitourinary: Negative for dysuria and testicular pain.  Musculoskeletal: Negative for arthralgias and myalgias.  Skin: Negative for rash.  Neurological: Negative for dizziness, weakness and headaches.   all other systems are negative except as noted in the HPI and PMH.     Physical Exam Updated Vital Signs BP 138/82   Pulse 67   Temp 97.6 F (36.4 C) (Oral)   Resp 17   Wt 63.9 kg   SpO2 93%   BMI 24.95 kg/m   Physical Exam Vitals signs and nursing note reviewed.  Constitutional:      General: He is not in acute distress.    Appearance: He is well-developed.  HENT:     Head: Normocephalic and atraumatic.     Nose: Nose normal. No  rhinorrhea.     Mouth/Throat:     Mouth: Mucous membranes are moist.     Pharynx: No oropharyngeal exudate.  Eyes:     Conjunctiva/sclera: Conjunctivae normal.     Pupils: Pupils are equal, round, and reactive to light.  Neck:     Musculoskeletal: Normal range of motion and neck supple.     Comments: No meningismus. Cardiovascular:     Rate and Rhythm: Normal rate.     Heart sounds: Normal heart sounds. No murmur.     Comments: Irregular tachycardia Pulmonary:     Effort: Pulmonary effort is normal. No respiratory distress.     Breath sounds: Rhonchi present.     Comments: Bibasilar crackles, diminished at the bases Abdominal:     Palpations: Abdomen is soft.     Tenderness: There is no abdominal tenderness. There is no guarding or rebound.  Musculoskeletal: Normal range of motion.        General: No tenderness.     Right lower leg: Edema present.     Left lower leg: Edema present.     Comments: +1 edema bilaterally Intact DP pulses  Skin:    General: Skin is warm.     Capillary Refill: Capillary refill takes less than 2 seconds.  Neurological:     Mental  Status: He is alert and oriented to person, place, and time.     Cranial Nerves: No cranial nerve deficit.     Motor: No abnormal muscle tone.     Coordination: Coordination normal.     Comments: No ataxia on finger to nose bilaterally. No pronator drift. 5/5 strength throughout. CN 2-12 intact.Equal grip strength. Sensation intact.   Psychiatric:        Behavior: Behavior normal.      ED Treatments / Results  Labs (all labs ordered are listed, but only abnormal results are displayed) Labs Reviewed  CBC WITH DIFFERENTIAL/PLATELET - Abnormal; Notable for the following components:      Result Value   WBC 11.3 (*)    RBC 3.62 (*)    Hemoglobin 11.5 (*)    HCT 36.0 (*)    Neutro Abs 9.0 (*)    Monocytes Absolute 1.1 (*)    All other components within normal limits  COMPREHENSIVE METABOLIC PANEL - Abnormal; Notable for the following components:   Sodium 130 (*)    CO2 17 (*)    Glucose, Bld 125 (*)    BUN 31 (*)    Calcium 8.6 (*)    Total Protein 6.1 (*)    Albumin 2.9 (*)    Total Bilirubin 1.7 (*)    GFR calc non Af Amer 51 (*)    GFR calc Af Amer 59 (*)    All other components within normal limits  PROTIME-INR - Abnormal; Notable for the following components:   Prothrombin Time 45.3 (*)    INR 4.95 (*)    All other components within normal limits  TROPONIN I    EKG EKG Interpretation  Date/Time:  Monday April 15 2018 04:06:45 EST Ventricular Rate:  73 PR Interval:    QRS Duration: 161 QT Interval:  495 QTC Calculation: 546 R Axis:   -70 Text Interpretation:  Atrial fibrillation Left bundle branch block Artifact No significant change was found Confirmed by Ezequiel Essex [phone]) on 04/15/2018 4:38:39 AM   Radiology Dg Chest Portable 1 View  Result Date: 04/15/2018 CLINICAL DATA:  Chest pain tonight. EXAM: PORTABLE CHEST 1 VIEW COMPARISON:  Radiograph  03/29/2018 FINDINGS: Unchanged cardiomegaly with aortic atherosclerosis and tortuosity. Progression in  reticular opacities consistent with pulmonary edema. Blunting of the costophrenic angles appears similar to prior exam. No new airspace disease or pneumothorax. Unchanged osseous structures with significant scoliotic curvature. IMPRESSION: Progressive pulmonary edema since last month. Unchanged cardiomegaly and aortic atherosclerosis. Aortic Atherosclerosis (ICD10-I70.0). Electronically Signed   By: Keith Rake M.D.   On: 04/15/2018 04:32    Procedures Procedures (including critical care time)  Medications Ordered in ED Medications  furosemide (LASIX) injection 20 mg (has no administration in time range)     Initial Impression / Assessment and Plan / ED Course  I have reviewed the triage vital signs and the nursing notes.  Pertinent labs & imaging results that were available during my care of the patient were reviewed by me and considered in my medical decision making (see chart for details).    1 week of progressively worsening shortness of breath is worse when he tries to lie down.  EKG is unchanged.  Ejection fraction 25 to 20% on recent echocardiogram.  Patient does not take diuretics at home.  Chest x-ray confirms pulmonary edema.  This explains patient's shortness of breath, orthopnea and PND.  Troponin is negative.  IV Lasix given.  INR 4.9.  Low suspicion for pulmonary embolism.  With new oxygen requirement and pulmonary edema, admission to the hospital will be requested.  Discussed with Dr. Hal Hope.  Final Clinical Impressions(s) / ED Diagnoses   Final diagnoses:  Acute on chronic systolic congestive heart failure [facility])    ED Discharge Orders    None       Ezequiel Essex, MD 04/15/18 [phone]

## 2018-04-16 DIAGNOSIS — E7800 Pure hypercholesterolemia, unspecified: Secondary | ICD-10-CM | POA: Diagnosis not present

## 2018-04-16 DIAGNOSIS — R791 Abnormal coagulation profile: Secondary | ICD-10-CM | POA: Diagnosis not present

## 2018-04-16 DIAGNOSIS — Y92230 Patient room in hospital as the place of occurrence of the external cause: Secondary | ICD-10-CM | POA: Diagnosis not present

## 2018-04-16 DIAGNOSIS — E039 Hypothyroidism, unspecified: Secondary | ICD-10-CM | POA: Diagnosis not present

## 2018-04-16 DIAGNOSIS — N179 Acute kidney failure, unspecified: Secondary | ICD-10-CM | POA: Diagnosis present

## 2018-04-16 DIAGNOSIS — E871 Hypo-osmolality and hyponatremia: Secondary | ICD-10-CM | POA: Diagnosis not present

## 2018-04-16 DIAGNOSIS — R64 Cachexia: Secondary | ICD-10-CM | POA: Diagnosis not present

## 2018-04-16 DIAGNOSIS — I472 Ventricular tachycardia: Secondary | ICD-10-CM | POA: Diagnosis not present

## 2018-04-16 DIAGNOSIS — J9601 Acute respiratory failure with hypoxia: Secondary | ICD-10-CM | POA: Diagnosis not present

## 2018-04-16 DIAGNOSIS — I5023 Acute on chronic systolic (congestive) heart failure: Secondary | ICD-10-CM | POA: Diagnosis not present

## 2018-04-16 DIAGNOSIS — R0602 Shortness of breath: Secondary | ICD-10-CM | POA: Diagnosis present

## 2018-04-16 DIAGNOSIS — I4821 Permanent atrial fibrillation: Secondary | ICD-10-CM | POA: Diagnosis not present

## 2018-04-16 DIAGNOSIS — Z515 Encounter for palliative care: Secondary | ICD-10-CM

## 2018-04-16 DIAGNOSIS — I447 Left bundle-branch block, unspecified: Secondary | ICD-10-CM | POA: Diagnosis not present

## 2018-04-16 DIAGNOSIS — I10 Essential (primary) hypertension: Secondary | ICD-10-CM

## 2018-04-16 DIAGNOSIS — F17210 Nicotine dependence, cigarettes, uncomplicated: Secondary | ICD-10-CM | POA: Diagnosis present

## 2018-04-16 DIAGNOSIS — M419 Scoliosis, unspecified: Secondary | ICD-10-CM | POA: Diagnosis present

## 2018-04-16 DIAGNOSIS — T501X5A Adverse effect of loop [high-ceiling] diuretics, initial encounter: Secondary | ICD-10-CM | POA: Diagnosis not present

## 2018-04-16 DIAGNOSIS — N400 Enlarged prostate without lower urinary tract symptoms: Secondary | ICD-10-CM | POA: Diagnosis present

## 2018-04-16 DIAGNOSIS — H9190 Unspecified hearing loss, unspecified ear: Secondary | ICD-10-CM | POA: Diagnosis present

## 2018-04-16 DIAGNOSIS — I2510 Atherosclerotic heart disease of native coronary artery without angina pectoris: Secondary | ICD-10-CM

## 2018-04-16 DIAGNOSIS — Z7189 Other specified counseling: Secondary | ICD-10-CM | POA: Diagnosis not present

## 2018-04-16 DIAGNOSIS — M5135 Other intervertebral disc degeneration, thoracolumbar region: Secondary | ICD-10-CM | POA: Diagnosis present

## 2018-04-16 DIAGNOSIS — J439 Emphysema, unspecified: Secondary | ICD-10-CM | POA: Diagnosis present

## 2018-04-16 DIAGNOSIS — Z66 Do not resuscitate: Secondary | ICD-10-CM | POA: Diagnosis not present

## 2018-04-16 DIAGNOSIS — I081 Rheumatic disorders of both mitral and tricuspid valves: Secondary | ICD-10-CM | POA: Diagnosis present

## 2018-04-16 DIAGNOSIS — Z7901 Long term (current) use of anticoagulants: Secondary | ICD-10-CM | POA: Diagnosis not present

## 2018-04-16 DIAGNOSIS — I110 Hypertensive heart disease with heart failure: Secondary | ICD-10-CM | POA: Diagnosis not present

## 2018-04-16 DIAGNOSIS — E785 Hyperlipidemia, unspecified: Secondary | ICD-10-CM | POA: Diagnosis present

## 2018-04-16 DIAGNOSIS — I252 Old myocardial infarction: Secondary | ICD-10-CM | POA: Diagnosis not present

## 2018-04-16 LAB — TROPONIN I
Troponin I: <0.03 ng/mL (ref <0.03)
Troponin I: <0.03 ng/mL (ref <0.03)

## 2018-04-16 LAB — CBC WITH DIFFERENTIAL/PLATELET
Abs Immature Granulocytes: 0.09 10*3/uL — ABNORMAL HIGH (ref 0.00–0.07)
Basophils Absolute: 0 10*3/uL (ref 0.0–0.1)
Basophils Relative: 0 %
Eosinophils Absolute: 0 10*3/uL (ref 0.0–0.5)
Eosinophils Relative: 0 %
HCT: 31.6 % — ABNORMAL LOW (ref 39.0–52.0)
Hemoglobin: 10.5 g/dL — ABNORMAL LOW (ref 13.0–17.0)
IMMATURE GRANULOCYTES: 1 %
Lymphocytes Relative: 13 %
Lymphs Abs: 1.4 10*3/uL (ref 0.7–4.0)
MCH: 31.8 pg (ref 26.0–34.0)
MCHC: 33.2 g/dL (ref 30.0–36.0)
MCV: 95.8 fL (ref 80.0–100.0)
MONOS PCT: 13 %
Monocytes Absolute: 1.4 10*3/uL — ABNORMAL HIGH (ref 0.1–1.0)
Neutro Abs: 7.8 10*3/uL — ABNORMAL HIGH (ref 1.7–7.7)
Neutrophils Relative %: 73 %
Platelets: 220 10*3/uL (ref 150–400)
RBC: 3.3 MIL/uL — ABNORMAL LOW (ref 4.22–5.81)
RDW: 13.9 % (ref 11.5–15.5)
WBC: 10.7 10*3/uL — ABNORMAL HIGH (ref 4.0–10.5)
nRBC: 0 % (ref 0.0–0.2)

## 2018-04-16 LAB — BASIC METABOLIC PANEL
ANION GAP: 11 (ref 5–15)
BUN: 32 mg/dL — ABNORMAL HIGH (ref 8–23)
CO2: 23 mmol/L (ref 22–32)
Calcium: 8.2 mg/dL — ABNORMAL LOW (ref 8.9–10.3)
Chloride: 96 mmol/L — ABNORMAL LOW (ref 98–111)
Creatinine, Ser: 1.46 mg/dL — ABNORMAL HIGH (ref 0.61–1.24)
GFR calc Af Amer: 48 mL/min — ABNORMAL LOW (ref >60)
GFR calc non Af Amer: 41 mL/min — ABNORMAL LOW (ref >60)
Glucose, Bld: 90 mg/dL (ref 70–99)
Potassium: 3.6 mmol/L (ref 3.5–5.1)
Sodium: 130 mmol/L — ABNORMAL LOW (ref 135–145)

## 2018-04-16 LAB — PROTIME-INR
INR: 4.62
Prothrombin Time: 42.9 seconds — ABNORMAL HIGH (ref 11.4–15.2)

## 2018-04-16 MED ORDER — AMIODARONE HCL 200 MG PO TABS
200.0000 mg | ORAL_TABLET | Freq: Every day | ORAL | Status: DC
  Administered 2018-04-17 – 2018-04-18 (×2): 200 mg via ORAL
  Filled 2018-04-16 (×2): qty 1

## 2018-04-16 MED ORDER — SALINE SPRAY 0.65 % NA SOLN
1.0000 | NASAL | Status: DC | PRN
  Administered 2018-04-16: 1 via NASAL
  Filled 2018-04-16: qty 44

## 2018-04-16 MED ORDER — FLUTICASONE PROPIONATE 50 MCG/ACT NA SUSP
1.0000 | Freq: Every day | NASAL | Status: DC
  Administered 2018-04-16: 1 via NASAL
  Filled 2018-04-16: qty 16

## 2018-04-16 NOTE — Care Management Obs Status (Signed)
[facility] NOTIFICATION   Patient Details  Name: CHARLEE WHITEBREAD MRN: [id number] Date of Birth: Feb 02, 1925   Medicare Observation Status Notification Given:  Yes    Maryclare Labrador, RN 04/16/2018, 11:15 AM

## 2018-04-16 NOTE — Progress Notes (Signed)
[facility]  Patient Status: Active (receiving services up to time of hospitalization)  [facility] is providing the following services: RN  If patient discharges after hours, please call [phone].   Janae Sauce 04/16/2018, 11:47 AM

## 2018-04-16 NOTE — Progress Notes (Signed)
[facility] PROGRESS NOTE  Christopher Butler KXF:[id number] DOB: 1925/03/28 DOA: 04/15/2018 PCP: Lajean Manes, MD  Assessment/Plan:  Acute on chronic systolic CHF -Patient with recent admission for vtach and known EF 20-25% as of 11/29 presenting with worsening SOB and hypoxia.CXR consistent with pulmonary edema. BNP 856. Troponin negative. Of note, chart review indicates he had issues with orthostatic hypotension during his recent hospitalization and so had some med changes including d/c lisinopril and amlodipine. Also determined not a candidate for ICD It also appears he has not required diuretics to manage in past. Getting IV lasix. Volume status -3.9. -continue IV lasix -have requested cards consult -palliative care consult  Elevated INR. Home meds include coumadin. INR is elevated but drifting down. No s/sx bleeding -hold coumadin  Acute kidney injury. Likely related to lasix. Urine output good -monitor closely -hold nephrotoxins as able  HTN issues with hypotension recently.  -obtain orthostatic vs -Continue Toprol  -monitor closely  HLD -Continue Lipitor  Afib -Rate controlled with Amiodarone and Toprol XL -Hold coumadin due to INR elevation  Hypothyroidism -Recent normal TSH -Continue Synthroid at current dose for now  Tobacco dependence -Encourage cessation.   -This was discussed with the patient and should be reviewed on an ongoing basis.   -Patch ordered at patient request.   Code Status: full discussed palliative care and goals of care. Lacks insight to situation Family Communication: none present wife with dementia Disposition Plan: back to facility   Consultants:  [facility]  Palliative care  Procedures:    Antibiotics:    HPI/Subjective: Admitted with cp sob orthopnea related to acute on chronic heart failure. Discharged 11 days ago.   Objective: Vitals:   04/16/18 0300 04/16/18 0730  BP: 119/70 132/69  Pulse: (!) 59 (!)  50  Resp: (!) 26 (!) 21  Temp: 97.8 F (36.6 C) 98.2 F (36.8 C)  SpO2: 96% 96%    Intake/Output Summary (Last 24 hours) at 04/16/2018 0908 Last data filed at 04/16/2018 0717 Gross per 24 hour  Intake 3 ml  Output 3950 ml  Net -3947 ml   Filed Weights   04/15/18 0413 04/16/18 0424  Weight: 63.9 kg 63.6 kg    Exam:   General:  Thin frail chronically ill appearing  Cardiovascular: irregular irregular no mgr trace LE edema  Respiratory: normal effort Respirations shallow bs distant with faint crackles  Abdomen: non distended non tender +BS no guarding or rebounding  Musculoskeletal: joints without swelling/erythema   Data Reviewed: Basic Metabolic Panel: Recent Labs  Lab 04/15/18 0415 04/16/18 0616  NA 130* 130*  K 5.0 3.6  CL 98 96*  CO2 17* 23  GLUCOSE 125* 90  BUN 31* 32*  CREATININE 1.24 1.46*  CALCIUM 8.6* 8.2*   Liver Function Tests: Recent Labs  Lab 04/15/18 0415  AST 23  ALT 26  ALKPHOS 121  BILITOT 1.7*  PROT 6.1*  ALBUMIN 2.9*   No results for input(s): LIPASE, AMYLASE in the last 168 hours. No results for input(s): AMMONIA in the last 168 hours. CBC: Recent Labs  Lab 04/15/18 0415 04/15/18 2346  WBC 11.3* 10.7*  NEUTROABS 9.0* 7.8*  HGB 11.5* 10.5*  HCT 36.0* 31.6*  MCV 99.4 95.8  PLT 252 220   Cardiac Enzymes: Recent Labs  Lab 04/15/18 0415 04/15/18 1825 04/15/18 2346 04/16/18 0616  TROPONINI <0.03 <0.03 <0.03 <0.03   BNP (last 3 results) Recent Labs    04/15/18 1825  BNP 856.7*    ProBNP (last 3 results)  No results for input(s): PROBNP in the last 8760 hours.  CBG: No results for input(s): GLUCAP in the last 168 hours.  No results found for this or any previous visit (from the past 240 hour(s)).   Studies: Dg Chest Portable 1 View  Result Date: 04/15/2018 CLINICAL DATA:  Chest pain tonight. EXAM: PORTABLE CHEST 1 VIEW COMPARISON:  Radiograph 03/29/2018 FINDINGS: Unchanged cardiomegaly with aortic  atherosclerosis and tortuosity. Progression in reticular opacities consistent with pulmonary edema. Blunting of the costophrenic angles appears similar to prior exam. No new airspace disease or pneumothorax. Unchanged osseous structures with significant scoliotic curvature. IMPRESSION: Progressive pulmonary edema since last month. Unchanged cardiomegaly and aortic atherosclerosis. Aortic Atherosclerosis (ICD10-I70.0). Electronically Signed   By: Keith Rake M.D.   On: 04/15/2018 04:32    Scheduled Meds: . amiodarone  400 mg Oral Daily  . aspirin EC  81 mg Oral Daily  . atorvastatin  10 mg Oral Daily  . docusate sodium  100 mg Oral BID  . furosemide  40 mg Intravenous Q12H  . levothyroxine  50 mcg Oral QAC breakfast  . metoprolol succinate  50 mg Oral BID WC  . nicotine  14 mg Transdermal Daily  . polyethylene glycol  17 g Oral Daily  . sodium chloride flush  3 mL Intravenous Q12H  . tamsulosin  0.4 mg Oral QHS  . Warfarin - Pharmacist Dosing Inpatient   Does not apply q1800   Continuous Infusions: . sodium chloride      Principal Problem:   Acute on chronic systolic CHF (congestive heart failure) (HCC) Active Problems:   Tobacco abuse   Permanent atrial fibrillation   Essential hypertension   Elevated INR   Acute kidney injury ([place])   Hypercholesterolemia   Acquired hypothyroidism    Time spent:     Radene Gunning NP [facility]  If 7PM-7AM, please contact night-coverage at [email or web address], password [facility] 04/16/2018, 9:08 AM  LOS: 0 days

## 2018-04-16 NOTE — Plan of Care (Signed)
  Problem: Health Behavior/Discharge Planning: Goal: Ability to manage health-related needs will improve Outcome: Progressing   Problem: Pain Managment: Goal: General experience of comfort will improve Outcome: Progressing   Problem: Safety: Goal: Ability to remain free from injury will improve Outcome: Progressing   

## 2018-04-16 NOTE — Evaluation (Signed)
Physical Therapy Evaluation Patient Details Name: Christopher Butler MRN: [id number] DOB: [birth year] Today's Date: 04/16/2018   History of Present Illness  84 y.o. male with medical history significant of acute myocardial infarction treated with thrombolytics in 4193, chronic systolic heart failure with LVEF 35%, permanent atrial fibrillation, history of COPD, hypertension and hypercholesterolemia, hypothyroidism who was admitted from 11/28- 12/6 for vtach.  He presented today with SOB and CP.  Clinical Impression  Orders received for PT evaluation. Patient demonstrates deficits in functional mobility as indicated below. Will benefit from continued skilled PT to address deficits and maximize function. Will see as indicated and progress as tolerated.  Recommend return to ALF with HHPT    Follow Up Recommendations Home health PT;Supervision for mobility/OOB    Equipment Recommendations  Rolling walker with 5" wheels    Recommendations for Other Services       Precautions / Restrictions Precautions Precautions: Fall Precaution Comments: watch HR; BP      Mobility  Bed Mobility Overal bed mobility: Needs Assistance Bed Mobility: Supine to Sit;Sit to Supine     Supine to sit: Supervision Sit to supine: Supervision   General bed mobility comments: Use of rail, increased time to get to EOB and return to supine  Transfers Overall transfer level: Needs assistance Equipment used: 1 person hand held assist Transfers: Sit to/from Stand Sit to Stand: Min guard         General transfer comment: min guard for safety and stability  Ambulation/Gait Ambulation/Gait assistance: Min assist Gait Distance (Feet): 180 Feet Assistive device: 1 person hand held assist Gait Pattern/deviations: Step-through pattern;Decreased stride length;Narrow base of support;Trunk flexed Gait velocity: decreased Gait velocity interpretation: <1.8 ft/sec, indicate of risk for recurrent falls General Gait  Details: min assist mainly for convienience due to no RW available. modest instability  Stairs            Wheelchair Mobility    Modified Rankin (Stroke Patients Only)       Balance Overall balance assessment: Needs assistance;History of Falls Sitting-balance support: Feet supported;No upper extremity supported Sitting balance-Leahy Scale: Good     Standing balance support: During functional activity;Bilateral upper extremity supported Standing balance-Leahy Scale: Poor Standing balance comment: Requires external support for standing and RW.               High Level Balance Comments: min guard assist for challenges to balance             Pertinent Vitals/Pain Pain Assessment: No/denies pain    Home Living Family/patient expects to be discharged to:: Assisted living([place]) Living Arrangements: Spouse/significant other Available Help at Discharge: Personal care attendant;Available PRN/intermittently Type of Home: Assisted living Home Access: Elevator       Home Equipment: Grab bars - tub/shower      Prior Function Level of Independence: Needs assistance   Gait / Transfers Assistance Needed: Walks independently, hx of falls. Walks to dining hall for meals.  ADL's / Homemaking Assistance Needed: Does his own ADLs.        Hand Dominance   Dominant Hand: Right    Extremity/Trunk Assessment   Upper Extremity Assessment Upper Extremity Assessment: Generalized weakness    Lower Extremity Assessment Lower Extremity Assessment: Generalized weakness    Cervical / Trunk Assessment Cervical / Trunk Assessment: Kyphotic  Communication   Communication: No difficulties  Cognition Arousal/Alertness: Awake/alert Behavior During Therapy: WFL for tasks assessed/performed Overall Cognitive Status: No family/caregiver present to determine baseline cognitive functioning Area of  Impairment: Memory                 Orientation Level:  Disoriented to;Situation   Memory: Decreased short-term memory                General Comments General comments (skin integrity, edema, etc.): HR and saturations stable despite Afib    Exercises     Assessment/Plan    PT Assessment Patient needs continued PT services  PT Problem List Decreased strength;Decreased mobility;Decreased balance;Cardiopulmonary status limiting activity;Decreased cognition;Decreased activity tolerance       PT Treatment Interventions Functional mobility training;Balance training;Patient/family education;Gait training;Therapeutic activities;Therapeutic exercise;Stair training;DME instruction    PT Goals (Current goals can be found in the Care Plan section)  Acute Rehab PT Goals Patient Stated Goal: to get better PT Goal Formulation: With patient Time For Goal Achievement: 04/30/18 Potential to Achieve Goals: Good    Frequency Min 3X/week   Barriers to discharge Decreased caregiver support      Co-evaluation               AM-PAC PT "6 Clicks" Mobility  Outcome Measure Help needed turning from your back to your side while in a flat bed without using bedrails?: None Help needed moving from lying on your back to sitting on the side of a flat bed without using bedrails?: None Help needed moving to and from a bed to a chair (including a wheelchair)?: None Help needed standing up from a chair using your arms (e.g., wheelchair or bedside chair)?: A Little Help needed to walk in hospital room?: A Little Help needed climbing 3-5 steps with a railing? : A Lot 6 Click Score: 20    End of Session Equipment Utilized During Treatment: Gait belt Activity Tolerance: Patient tolerated treatment well Patient left: in bed;with call bell/phone within reach Nurse Communication: Mobility status PT Visit Diagnosis: Unsteadiness on feet (R26.81);History of falling (Z91.81);Difficulty in walking, not elsewhere classified (R26.2)    Time: 8676-7209 PT  Time Calculation (min) (ACUTE ONLY): 21 min   Charges:   PT Evaluation $PT Eval Moderate Complexity: 1 Mod          Alben Deeds, PT DPT  Board Certified Neurologic Specialist Acute Rehabilitation Services Pager [phone] Office [phone]   Duncan Dull 04/16/2018, 8:58 AM

## 2018-04-16 NOTE — Consult Note (Addendum)
Cardiology Consultation:   Patient ID: Christopher Butler MRN: [id number]; DOB: 1924-05-22  Admit date: 04/15/2018 Date of Consult: 04/16/2018  Primary Care Provider: Lajean Manes, MD Primary Cardiologist: Mertie Moores, MD  Primary Electrophysiologist:  Constance Haw, MD    Patient Profile:   Christopher Melman Prateris a 84 y.o.malewith a history of acute myocardial infarction treated with thrombolytics in 6440, chronic systolic heart failure with LVEF of 20-25%, permanent atrial fibrillation, chronic anticoagulation w/ coumadin, history of COPD, hypertension, hypercholesterolemia, hypothyroidism and recent admission for several syncopal events 2/2 sustained ventricular tachycardia, who has been readmitted for acute on chronic systolic CHF. Cardiology consulted to assist with CHF management, at the request of Dr. Evangeline Gula, Internal Medicine.   History of Present Illness:   Christopher Hedden Prateris a 84 y.o.malewith a history of acute myocardial infarction treated with thrombolytics in 3474, chronic systolic heart failure with LVEF of 20-25%, permanent atrial fibrillation, chronic anticoagulation w/ coumadin, history of COPD, hypertension, hypercholesterolemia, hypothyroidism and recent admission for several syncopal events 2/2 sustained ventricular tachycardia, who has been readmitted for acute on chronic systolic CHF. Cardiology consulted to assist with CHF management, at the request of Dr. Evangeline Gula, Internal Medicine.   Pt's recent admission was 11/28-12-6. He was brought in by EMS. ECG obtained by EMS showed monomorphic VT at approximately 212 bpm with right superior axis, positive concordance and very broad QRS well over 150 ms. The subsequent ECG performed in the ED showed atrial fibrillation with PVCs and prominent ST depression across the anterior precordial leads,QTC 441 ms. Electrolytes were stable. On cardiology's evaluation in the ED, he was in atrial fibrillation and denied angina,  dyspnea, diaphoresis, nausea, dizziness/near syncope or palpitations. Pt was evaluated in the ED by Dr. Sallyanne Kuster. He felt his VT was c/w inferior scar. He was admitted by general cardiology and placed on amiodarone drip.   He was noted to have elevated troponins, peaking at 5.46, but this was felt related to demand ischemia from VT. He denied recent or active anginal symptoms. Echo showed reduced LVEF at 20-25% (previously 35%). Repeat cath was not recommended given lack of symptoms. He was seen by EP who recommended continuation of amiodarone. It was determined he is not a candidate for ICD due to advanced age. He did well on IV amiodarone with no significant recurrence. Some NSVT but no sustained VT. After IV amio infusion, he was transitioned to 400 mg BID for the remainder of his hospital stay. Dose was reduced down to 400 mg daily at time of discharge.   Pt also had issues with orthostatic hypotension w/ SBPs dropping into the 80s upon standing. His meds were adjusted. His home lisinopril and amlodipine were discontinued. Metoprolol was continued for VT but he tolerated ok. Flomax also continued for BPH. BP was stable day of d/c. No lasix on d/c.  Home coumadin dose continued for atrial fibrillation. He was discharged home on 04/05/18 with plans for outpatient f/u.   He presented back to the [facility] ED on 04/15/18 w/ CC of SOB and CP. Symptoms c/w acute CHF. He noted associated orthopnea and PND with associated CP. Cant lay flat at night. Also productive cough. In the ED, BNP was 857. CXR showed progressive pulmonary edema since last month. EKG shows rate controlled AFib w/ new LBBB. SCr was 1.24. Sodium 130. He also had a supra therapeutic INR at 4.95. Hgb was 11.5. Pt was admitted by IM and given a dose of 20 mg IV lasix in the ED. His  coumadin was held.   He continues on IV Lasix., now 40 mg BID. I/Os net negative 3.9L since admit. SCr has bump from 1.2> 1.46. Sodium remains low at 130. INR trending down,  at 4.62 today. Troponin's are negative x 3. BP was 117/99 at last VS check.   He wants to keep management conservative. He expressed today that he does not want any invasive procedures.    Past Medical History:  Diagnosis Date  . Acute myocardial infarction 1987   s/p TPA  . Anxiety   . Arthritis    "probably in my back" (04/15/2018)  . CAD (coronary artery disease)    a. remote inferior wall myocardial infarction (status post streptokinase in 1987-did not require angioplasty).  . Cholelithiases   . Chronic lower back pain   . Chronic systolic CHF (congestive heart failure) ([place])   . CKD (chronic kidney disease), stage II   . COPD (chronic obstructive pulmonary disease) ([place])   . Disc disease, degenerative, thoracic or thoracolumbar   . ED (erectile dysfunction)   . Emphysema   . Headache    "usually 2/wk" (04/15/2018)  . HTN (hypertension)   . Hypercholesteremia   . Hypertensive heart disease with chronic systolic congestive heart failure ([place])   . Hyponatremia Sept 2010  . Hypothyroidism   . Permanent atrial fibrillation   . Sleep apnea    pt denies this hx on 04/15/2018  . Tobacco abuse   . Tricuspid regurgitation     Past Surgical History:  Procedure Laterality Date  . APPENDECTOMY    . CARDIAC CATHETERIZATION  1987  . CATARACT EXTRACTION, BILATERAL Bilateral ~ 2018  . TONSILLECTOMY       Home Medications:  Prior to Admission medications   Medication Sig Start Date End Date Taking? Authorizing Provider  amiodarone (PACERONE) 400 MG tablet Take 1 tablet (400 mg total) by mouth daily. 04/06/18  Yes Lyda Jester M, PA-C  aspirin EC 81 MG EC tablet Take 1 tablet (81 mg total) by mouth daily. 04/06/18  Yes Lyda Jester M, PA-C  atorvastatin (LIPITOR) 10 MG tablet Take 1 tablet (10 mg total) by mouth daily. 06/26/17  Yes Dunn, Dayna N, PA-C  levothyroxine (SYNTHROID, LEVOTHROID) 50 MCG tablet Take 50 mcg by mouth daily before breakfast.   Yes [provider]  LORazepam (ATIVAN) 0.5 MG tablet Take 0.5 mg by mouth daily as needed for anxiety.   Yes [provider]  metoprolol succinate (TOPROL-XL) 50 MG 24 hr tablet Take 1 tablet (50 mg total) by mouth 2 (two) times daily with a meal. Take with or immediately following a meal. 04/05/18  Yes Lyda Jester M, PA-C  nitroGLYCERIN (NITROSTAT) 0.4 MG SL tablet Place 1 tablet (0.4 mg total) under the tongue every 5 (five) minutes as needed for chest pain. 04/05/18  Yes Lyda Jester M, PA-C  potassium chloride SA (K-DUR,KLOR-CON) 20 MEQ tablet Take 2 tablets (40 mEq total) by mouth 2 (two) times daily. 04/05/18  Yes Lyda Jester M, PA-C  tamsulosin (FLOMAX) 0.4 MG CAPS capsule Take 0.4 mg by mouth at bedtime.   Yes [provider]  traMADol (ULTRAM) 50 MG tablet Take 1 tablet (50 mg total) by mouth 2 (two) times daily as needed (for pain.). ADDITIONAL REFILLS FROM PRIMARY CARE PHYSICIAN Patient taking differently: Take 50 mg by mouth 2 (two) times daily as needed (for pain.).  06/04/15  Yes Darlin Coco, MD  warfarin (COUMADIN) 5 MG tablet TAKE AS DIRECTED BY COUMADIN CLINIC  Patient taking differently: Take 2.5-5 mg by mouth See admin instructions. TAKE 5 mg on mon. Wed. Fri.  Take 2.5 mg tues thurs,fri. sat and sun 12/24/17  Yes Nahser, Wonda Cheng, MD    Inpatient Medications: Scheduled Meds: . amiodarone  400 mg Oral Daily  . aspirin EC  81 mg Oral Daily  . atorvastatin  10 mg Oral Daily  . docusate sodium  100 mg Oral BID  . furosemide  40 mg Intravenous Q12H  . levothyroxine  50 mcg Oral QAC breakfast  . metoprolol succinate  50 mg Oral BID WC  . nicotine  14 mg Transdermal Daily  . polyethylene glycol  17 g Oral Daily  . sodium chloride flush  3 mL Intravenous Q12H  . tamsulosin  0.4 mg Oral QHS  . Warfarin - Pharmacist Dosing Inpatient   Does not apply q1800   Continuous Infusions: . sodium chloride     PRN Meds: sodium chloride, acetaminophen,  bisacodyl, LORazepam, ondansetron (ZOFRAN) IV, sodium chloride flush, traMADol  Allergies:    Allergies  Allergen Reactions  . Ceclor [Cefaclor]   . Erythromycin     Social History:   Social History   Socioeconomic History  . Marital status: Married    Spouse name: Not on file  . Number of children: Not on file  . Years of education: Not on file  . Highest education level: Not on file  Occupational History  . Not on file  Social Needs  . Financial resource strain: Not on file  . Food insecurity:    Worry: Not on file    Inability: Not on file  . Transportation needs:    Medical: Not on file    Non-medical: Not on file  Tobacco Use  . Smoking status: Current Every Day Smoker    Packs/day: 0.50    Years: 70.00    Pack years: 35.00  . Smokeless tobacco: Never Used  Substance and Sexual Activity  . Alcohol use: Yes    Comment: 04/15/2018 couple of drinks most nights prior to Thanksgiving 2019"; nothing since  . Drug use: No  . Sexual activity: Not Currently  Lifestyle  . Physical activity:    Days per week: Not on file    Minutes per session: Not on file  . Stress: Not on file  Relationships  . Social connections:    Talks on phone: Not on file    Gets together: Not on file    Attends religious service: Not on file    Active member of club or organization: Not on file    Attends meetings of clubs or organizations: Not on file    Relationship status: Not on file  . Intimate partner violence:    Fear of current or ex partner: Not on file    Emotionally abused: Not on file    Physically abused: Not on file    Forced sexual activity: Not on file  Other Topics Concern  . Not on file  Social History Narrative  . Not on file    Family History:    Family History  Family history unknown: Yes     ROS:  Please see the history of present illness.   All other ROS reviewed and negative.     Physical Exam/Data:   Vitals:   04/16/18 0424 04/16/18 0730 04/16/18  1124 04/16/18 1331  BP:  132/69 (!) 117/99 (!) 117/99  Pulse:  (!) 50 66 73  Resp:  (!) 21 15 16  Temp:  98.2 F (36.8 C) 98 F (36.7 C) 98 F (36.7 C)  TempSrc:  Oral Oral Oral  SpO2:  96% 93%   Weight: 63.6 kg   63.6 kg  Height:    _0  (1.6 m)    Intake/Output Summary (Last 24 hours) at 04/16/2018 1342 Last data filed at 04/16/2018 0717 Gross per 24 hour  Intake -  Output 3000 ml  Net -3000 ml   Filed Weights   04/15/18 0413 04/16/18 0424 04/16/18 1331  Weight: 63.9 kg 63.6 kg 63.6 kg   Body mass index is 24.84 kg/m.  General: well appearing 84 y/o male. HEENT: normal Lymph: no adenopathy Neck: no JVD Endocrine:  No thryomegaly Vascular: No carotid bruits; FA pulses 2+ bilaterally without bruits  Cardiac:  irregularly irregular rhythm, regular rate Lungs:  clear to auscultation bilaterally, no wheezing, rhonchi or rales  Abd: soft, nontender, no hepatomegaly  Ext: no edema Musculoskeletal:  No deformities, BUE and BLE strength normal and equal Skin: warm and dry  Neuro:  CNs 2-12 intact, no focal abnormalities noted Psych:  Normal affect   EKG:  The EKG was personally reviewed and demonstrates:  Atrial fibrillation, rate controlled, new LBBB Telemetry:  Telemetry was personally reviewed and demonstrates:  Chronic afib, rate controlled, some PVCs.   Relevant CV Studies: 2D Echo 03/29/18 Study Conclusions  - Left ventricle: The cavity size was moderately dilated. Wall thickness was normal. Systolic function was severely reduced. The estimated ejection fraction was in the range of 20% to 25%. Diffuse hypokinesis with inferolateral akinesis. The study is not technically sufficient to allow evaluation of LV diastolic function. - Mitral valve: Mildly thickened leaflets . There was moderate regurgitation. - Left atrium: Massively dilated. - Tricuspid valve: There was mild regurgitation. - Pulmonary arteries: PA peak pressure: 46 mm Hg (S). -  Systemic veins: Not visualized.  Impressions:  - Technically difficult study. LVEF 20-25%, severe global hypokinesis with inferolateral akinesis, moderately dilated LV, moderate MR, severe LAE, mild TR, RVSP 46 mmHg + RAP, no pericardial effusion.  Laboratory Data:  Chemistry Recent Labs  Lab 04/15/18 0415 04/16/18 0616  NA 130* 130*  K 5.0 3.6  CL 98 96*  CO2 17* 23  GLUCOSE 125* 90  BUN 31* 32*  CREATININE 1.24 1.46*  CALCIUM 8.6* 8.2*  GFRNONAA 51* 41*  GFRAA 59* 48*  ANIONGAP 15 11    Recent Labs  Lab 04/15/18 0415  PROT 6.1*  ALBUMIN 2.9*  AST 23  ALT 26  ALKPHOS 121  BILITOT 1.7*   Hematology Recent Labs  Lab 04/15/18 0415 04/15/18 2346  WBC 11.3* 10.7*  RBC 3.62* 3.30*  HGB 11.5* 10.5*  HCT 36.0* 31.6*  MCV 99.4 95.8  MCH 31.8 31.8  MCHC 31.9 33.2  RDW 13.9 13.9  PLT 252 220   Cardiac Enzymes Recent Labs  Lab 04/15/18 0415 04/15/18 1825 04/15/18 2346 04/16/18 0616  TROPONINI <0.03 <0.03 <0.03 <0.03   No results for input(s): TROPIPOC in the last 168 hours.  BNP Recent Labs  Lab 04/15/18 1825  BNP 856.7*    DDimer No results for input(s): DDIMER in the last 168 hours.  Radiology/Studies:  Dg Chest Portable 1 View  Result Date: 04/15/2018 CLINICAL DATA:  Chest pain tonight. EXAM: PORTABLE CHEST 1 VIEW COMPARISON:  Radiograph 03/29/2018 FINDINGS: Unchanged cardiomegaly with aortic atherosclerosis and tortuosity. Progression in reticular opacities consistent with pulmonary edema. Blunting of the costophrenic angles appears similar to prior exam. No new airspace disease  or pneumothorax. Unchanged osseous structures with significant scoliotic curvature. IMPRESSION: Progressive pulmonary edema since last month. Unchanged cardiomegaly and aortic atherosclerosis. Aortic Atherosclerosis (ICD10-I70.0). Electronically Signed   By: Keith Rake M.D.   On: 04/15/2018 04:32    Assessment and Plan:   Kasai Beltran Prateris a 84  y.o.malewith a history of acute myocardial infarction treated with thrombolytics in 2440, chronic systolic heart failure with LVEF of 20-25%, permanent atrial fibrillation, chronic anticoagulation w/ coumadin, history of COPD, hypertension, hypercholesterolemia, hypothyroidism and recent admission for several syncopal events 2/2 sustained ventricular tachycardia, who has been readmitted for acute on chronic systolic CHF. Cardiology consulted to assist with CHF management, at the request of Dr. Evangeline Gula, Internal Medicine.   1. Acute on Chronic Systolic CHF: recently found to have further reduction in LVEF, now at 20-25% (previously 35%). Not previously on daily scheduled lasix at home due to orthostatic hypotension. Readmitted yesterday with acute on chronic CHF with elevated BNP at 856 and CXR showing increased pulmonary edema, compared to prior study. He is responding well to IV diuretics. Net I/Os -3L since admission yesterday. Slight bump in SCr overnight from 1.2>>1.4. he does not appear grossly volume overloaded on exam but still hypoxic on RA. O2 sats dropped in the 80s and supplemental O2 resumed. Continue IV Lasix at 40 mg BID.  Continue to monitor renal function, electrolytes and BP closely. F/u BMP in the AM.  We discussed that he should start checking his wait daily once he returns home to help better monitor volume status.   2. VT: recent admission for monomorphic VT, felt secondary to scar. H/o prior MI. Treated with amiodarone. Received IV load followed by transition to oral, 400 mg BID>>400 mg daily. He has a new LBBB. We will reduce amiodarone down to 200 mg daily Not a candidate for ICD given advanced age. Tele reviewed no VT this admit, only a few PVCs. Continue amiodarone and metoprolol.   3. CAD w/ new LBBB: history of acute myocardial infarction treated with thrombolytics in 1987. Recent diagnosis of VT. Echo with further decline in LVEF. Troponin's negative x 3. No plans for cath given  advanced age and lack of symptoms.  Pt also agrees. He expressed today that he does not want any invasive procedures.  His wishes are to keep management conservative. Also suspect new LBBB is 2/2 amiodarone. Will reduce dose down to 200 mg daily.    4. Hypertension: issues with hypotension recently. BP stable but soft. He is requiring IV diuretics for management of acute on chronic systolic CHF. Monitor pressures closely. Repeat orthostatics. Up with assistance only.   5. Chronic Atrial fibrillation: rate controlled with metoprolol. Coumadin on hold due to supratheraeputic INR > 4.   6. HLD: on statin therapy w/ Lipitor.   7. Hypothyroidism: on synthroid. Recent TSH WNL.   Note: IM has also asked for palliative care consult.   Cards MD to see later today. We will continue to follow along.    For questions or updates, please contact [place] Please consult [email or web address] for contact info under     Signed, Lyda Jester, PA-C  04/16/2018 1:42 PM  I have seen and examined the patient along with Lyda Jester, PA-C.  I have reviewed the chart, notes and new data.  I agree with PA/NP's note.  Key new complaints: Shortness of breath has improved with diuresis, but has not resolved.  Had transient right-sided chest discomfort that sounded musculoskeletal rather than anginal.  No palpitations, no recent  syncope. Key examination changes: Underweight, borderline cachectic, severe kyphoscoliosis.  Jugular venous pulsations are elevated no more than 3-4 cm above the sternal angle, clear lungs.  Irregular rhythm with paradoxically split S2, no significant murmurs.  No edema.  Heart rate in the 70s, blood pressure in the 110s/70s.  Surprisingly, his weight is not meaningfully changed from recent hospital discharge. Key new findings / data: Atrial fibrillation with controlled ventricular response.  ECG shows broad QRS with a pattern consistent with typical left bundle branch block.  On the  monitor it appears that he sometimes has narrow QRS with longer RR intervals.  BNP is elevated but there is no baseline for comparison.  Chest x-ray shows bilateral infiltrates with cephalization of circulation, consistent with heart failure.  He has massive cardiomegaly.  The axilla does not appear changed from previous.  Mild hyponatremia noted  PLAN: Appears to have acute pulmonary edema due to heart failure exacerbation. The cause for decompensation is not immediately obvious.  He is not having ventricular arrhythmia.  There has been no meaningful change in weight.  Cardiac troponin level has been undetectable on this admission. Is possible that amiodarone reduced conduction abnormality with a new full-blown left bundle branch block is a cause of heart failure exacerbation due to dyssynchrony. He reaffirms his decision to not have invasive procedures such as ordinary angiography or cardiac resynchronization pacemaker. I do not think this is the case, but need to keep in mind the possibility that his infiltrates are related to amiodarone lung disease (since there has been no obvious volume gain).  We will check ESR. Meanwhile continue diuretics, monitor renal function.  Slight increase in creatinine level is not unexpected.  Reduce the amiodarone to a dose of 200 mg daily.  Restrict fluids due to developing hyponatremia.  Sanda Klein, MD, [place] [phone] 04/16/2018, 3:34 PM

## 2018-04-16 NOTE — Progress Notes (Signed)
Pt complaining of chest pain in the mid-right side of chest. Rates 5-6/10. Primary team made aware. Advised to page cardiology- cardiology stated that they have not seen the patient yet but would be around shortly.

## 2018-04-16 NOTE — Progress Notes (Signed)
ANTICOAGULATION CONSULT NOTE  Pharmacy Consult for warfarin Indication: atrial fibrillation   Patient Measurements: Weight: 140 lb 3.4 oz (63.6 kg)   Vital Signs: Temp: 98 F (36.7 C) (12/17 1124) Temp Source: Oral (12/17 1124) BP: 117/99 (12/17 1124) Pulse Rate: 66 (12/17 1124)  Labs: Recent Labs    04/15/18 0415 04/15/18 1825 04/15/18 2346 04/16/18 0616  HGB 11.5*  --  10.5*  --   HCT 36.0*  --  31.6*  --   PLT 252  --  220  --   LABPROT 45.3*  --   --  42.9*  INR 4.95*  --   --  4.62*  CREATININE 1.24  --   --  1.46*  TROPONINI <0.03 <0.03 <0.03 <0.03    Estimated Creatinine Clearance: 26.5 mL/min (A) (by C-G formula based on SCr of 1.46 mg/dL (H)).   Medical History: Past Medical History:  Diagnosis Date  . Acute myocardial infarction 1987   s/p TPA  . Anxiety   . Arthritis    "probably in my back" (04/15/2018)  . CAD (coronary artery disease)    a. remote inferior wall myocardial infarction (status post streptokinase in 1987-did not require angioplasty).  . Cholelithiases   . Chronic lower back pain   . Chronic systolic CHF (congestive heart failure) ([place])   . CKD (chronic kidney disease), stage II   . COPD (chronic obstructive pulmonary disease) ([place])   . Disc disease, degenerative, thoracic or thoracolumbar   . ED (erectile dysfunction)   . Emphysema   . Headache    "usually 2/wk" (04/15/2018)  . HTN (hypertension)   . Hypercholesteremia   . Hypertensive heart disease with chronic systolic congestive heart failure ([place])   . Hyponatremia Sept 2010  . Hypothyroidism   . Permanent atrial fibrillation   . Sleep apnea    pt denies this hx on 04/15/2018  . Tobacco abuse   . Tricuspid regurgitation     Assessment: 84 yo male admitted with SOB, dull chest pain.   On warfarin PTA for AFib. INR 4.95 on admit, down to 4.62 today. Hg 10.5, plt wnl on admit. No active bleed issues documented. Last dose of warfarin on 12/15. PTA warfarin: 5 mg on  Mon/Wed/Fri, 2.5 mg on all other days.   Goal of Therapy:  Heparin level 0.3-0.7 units/ml Monitor platelets by anticoagulation protocol: Yes    Plan:  -Hold warfarin again tonight -Monitor daily INR, CBC, s/sx bleeding  Elicia Lamp, PharmD, BCPS Clinical Pharmacist Clinical phone [phone] Please check AMION for all [place] contact numbers 04/16/2018 11:50 AM

## 2018-04-16 NOTE — Care Management Note (Addendum)
Case Management Note  Patient Details  Name: KYI ROMANELLO MRN: [id number] Date of Birth: 19-Jan-1925  Subjective/Objective:     Pt admitted with CP and SOB               Action/Plan:    PTA from ALF with his wife, pts wife has dementia and pt is the caregiver.  Pt was previously set up with [facility] however agency was not within network.  Pt active with [facility] at home for RN, PT, OT and aide - agency contacted and informed of admit.  CM provided medicare.gov HH list- pt wants to remain with [facility], Copy of list placed in shadow chart.    CM requested resumption orders from attending.  Pt educated on importance of daily weights and low sodium diet adherence.     Expected Discharge Date:  04/18/18               Expected Discharge Plan:  [place]  In-House Referral:     Discharge planning Services  CM Consult  Post Acute Care Choice:    Choice offered to:     DME Arranged:    DME Agency:     HH Arranged:  RN, PT, Nurse's Aide [place] Agency:  [facility]  Status of Service:  Completed, signed off  If discussed at [place] of Stay Meetings, dates discussed:    Additional Comments: [facility] confirmed pt is not active for OT.  CM requested resumption orders for [facility] Maryclare Labrador, RN 04/18/2018, 10:01 AM

## 2018-04-16 NOTE — Consult Note (Signed)
Consultation Note Date: 04/16/2018   Patient Name: CRYSTAL ELLWOOD  DOB: 12-05-1924  MRN: [id number]  Age / Sex: 84 y.o., male  PCP: Lajean Manes, MD Referring Physician: Lady Deutscher, MD  Reason for Consultation: Establishing goals of care  HPI/Patient Profile: 84 y.o. male  with past medical history of MI (2094), chronic systolic HF (EF 70-96%), a fib on coumadin, COPD, HTN, HLD, and hypothyroidism admitted on 04/15/2018 with acute on chronic systolic heart failure. Chest x-ray reveals pulmonary edema. Recent admission in Nov for syncopal events secondary to [facility].  Not a candidate for ICD d/t advanced age. Patient lives in [place] with his wife who has dementia. PMT consulted for [place].  Clinical Assessment and Goals of Care: I have reviewed medical records including EPIC notes, labs and imaging, assessed the patient and then met with patient to discuss diagnosis prognosis, GOC, EOL wishes, disposition and options.  I introduced Palliative Medicine as specialized medical care for people living with serious illness. It focuses on providing relief from the symptoms and stress of a serious illness. The goal is to improve quality of life for both the patient and the family.  We discussed a brief life review of the patient. He tells me about his career. Tells me he has 3 children - 2 local and one in [place]. He tells me about his wife and her dementia. 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   As far as functional and nutritional status, he tells me recently he has required a walker for ambulation but is able to complete ADLs independently. He does endorse declining appetite.    We discussed his current illness and what it means in the larger context of his on-going co-morbidities.  Natural disease trajectory and expectations at EOL were discussed. Specifically discussed his heart failure at length.   I attempted to elicit values and goals  of care important to the patient.    Advance directives, concepts specific to code status, artifical feeding and hydration, and rehospitalization were considered and discussed. After long discussion about code status patient tells me he would want a natural death and elects DNR status. He tells me he would want his daughter, Edmonia Lynch, to be his HCPOA if he were unable to make decisions for himself.   At the request of the patient, I called his daughter Edmonia Lynch to discuss above. We reviewed patient's condition and her questions were answered. We also discussed code status and she agreed that DNR is appropriate. She is concerned about patient returning to ALF and suggests she may have him come to her home with home health.   Questions and concerns were addressed. The patient and family were encouraged to call with questions or concerns.   Primary Decision Maker PATIENT  He elects daughter, Edmonia Lynch as Chauncey Reading, if he were unable to make decisions for himself  SUMMARY OF RECOMMENDATIONS   - initial palliative conversation about diagnoses and goals of care - code status changed to DNR; continue current care - family updated and agree - PMT will shadow chart for needs during hospitalization  Code Status/Advance Care Planning:  DNR   Symptom Management:   Per primary  He did request saline nasal spray during visit - ordered PRN  Prognosis:   Unable to determine  Discharge Planning: To Be Determined      Primary Diagnoses: Present on Admission: . Acute on chronic systolic CHF (congestive heart failure) ([place]) . Hypercholesterolemia . Permanent atrial fibrillation . Tobacco abuse .  Essential hypertension . Acquired hypothyroidism . Elevated INR . Acute kidney injury ([place])   I have reviewed the medical record, interviewed the patient and family, and examined the patient. The following aspects are pertinent.  Past Medical History:  Diagnosis Date  . Acute myocardial infarction  1987   s/p TPA  . Anxiety   . Arthritis    "probably in my back" (04/15/2018)  . CAD (coronary artery disease)    a. remote inferior wall myocardial infarction (status post streptokinase in 1987-did not require angioplasty).  . Cholelithiases   . Chronic lower back pain   . Chronic systolic CHF (congestive heart failure) ([place])   . CKD (chronic kidney disease), stage II   . COPD (chronic obstructive pulmonary disease) ([place])   . Disc disease, degenerative, thoracic or thoracolumbar   . ED (erectile dysfunction)   . Emphysema   . Headache    "usually 2/wk" (04/15/2018)  . HTN (hypertension)   . Hypercholesteremia   . Hypertensive heart disease with chronic systolic congestive heart failure ([place])   . Hyponatremia Sept 2010  . Hypothyroidism   . Permanent atrial fibrillation   . Sleep apnea    pt denies this hx on 04/15/2018  . Tobacco abuse   . Tricuspid regurgitation    Social History   Socioeconomic History  . Marital status: Married    Spouse name: Not on file  . Number of children: Not on file  . Years of education: Not on file  . Highest education level: Not on file  Occupational History  . Not on file  Social Needs  . Financial resource strain: Not on file  . Food insecurity:    Worry: Not on file    Inability: Not on file  . Transportation needs:    Medical: Not on file    Non-medical: Not on file  Tobacco Use  . Smoking status: Current Every Day Smoker    Packs/day: 0.50    Years: 70.00    Pack years: 35.00  . Smokeless tobacco: Never Used  Substance and Sexual Activity  . Alcohol use: Yes    Comment: 04/15/2018 couple of drinks most nights prior to Thanksgiving 2019"; nothing since  . Drug use: No  . Sexual activity: Not Currently  Lifestyle  . Physical activity:    Days per week: Not on file    Minutes per session: Not on file  . Stress: Not on file  Relationships  . Social connections:    Talks on phone: Not on file    Gets together: Not on file     Attends religious service: Not on file    Active member of club or organization: Not on file    Attends meetings of clubs or organizations: Not on file    Relationship status: Not on file  Other Topics Concern  . Not on file  Social History Narrative  . Not on file   Family History  Family history unknown: Yes   Scheduled Meds: . [START ON 04/17/2018] amiodarone  200 mg Oral Daily  . aspirin EC  81 mg Oral Daily  . atorvastatin  10 mg Oral Daily  . docusate sodium  100 mg Oral BID  . fluticasone  1 spray Each Nare Daily  . furosemide  40 mg Intravenous Q12H  . levothyroxine  50 mcg Oral QAC breakfast  . metoprolol succinate  50 mg Oral BID WC  . nicotine  14 mg Transdermal Daily  . polyethylene glycol  17  g Oral Daily  . sodium chloride flush  3 mL Intravenous Q12H  . tamsulosin  0.4 mg Oral QHS  . Warfarin - Pharmacist Dosing Inpatient   Does not apply q1800   Continuous Infusions: . sodium chloride     PRN Meds:.sodium chloride, acetaminophen, bisacodyl, LORazepam, ondansetron (ZOFRAN) IV, sodium chloride, sodium chloride flush, traMADol Allergies  Allergen Reactions  . Ceclor [Cefaclor]   . Erythromycin    Review of Systems  Constitutional: Positive for activity change, appetite change and fatigue.  Cardiovascular: Positive for leg swelling.    Physical Exam Constitutional:      General: He is not in acute distress.    Appearance: He is cachectic.  HENT:     Head: Normocephalic and atraumatic.  Cardiovascular:     Rate and Rhythm: Normal rate. Rhythm irregular.  Pulmonary:     Effort: Pulmonary effort is normal.     Comments: 2 L [place] Abdominal:     General: There is distension.  Musculoskeletal:     Right lower leg: Edema present.     Left lower leg: Edema present.  Skin:    General: Skin is warm and dry.  Neurological:     Mental Status: He is alert and oriented to person, place, and time.  Psychiatric:        Cognition and Memory: Cognition and  memory normal.     Vital Signs: BP (!) 117/99   Pulse 73   Temp 98 F (36.7 C) (Oral)   Resp 16   Ht '5\' 3"'$  (1.6 m)   Wt 63.6 kg   SpO2 93%   BMI 24.84 kg/m  Pain Scale: 0-10   Pain Score: 5    SpO2: SpO2: 93 % O2 Device:SpO2: 93 % O2 Flow Rate: .O2 Flow Rate (L/min): 3 L/min  IO: Intake/output summary:   Intake/Output Summary (Last 24 hours) at 04/16/2018 1636 Last data filed at 04/16/2018 1457 Gross per 24 hour  Intake -  Output 3100 ml  Net -3100 ml    LBM: Last BM Date: 04/15/18 Baseline Weight: Weight: 63.9 kg Most recent weight: Weight: 63.6 kg     Palliative Assessment/Data: PPS 50%     Time Total: 70 minutes Greater than 50%  of this time was spent counseling and coordinating care related to the above assessment and plan.  Juel Burrow, DNP, AGNP-C Palliative Medicine Team [phone] Pager: [phone]

## 2018-04-17 LAB — MAGNESIUM: Magnesium: 1.8 mg/dL (ref 1.7–2.4)

## 2018-04-17 LAB — BASIC METABOLIC PANEL
Anion gap: 13 (ref 5–15)
BUN: 34 mg/dL — ABNORMAL HIGH (ref 8–23)
CO2: 23 mmol/L (ref 22–32)
CREATININE: 1.57 mg/dL — AB (ref 0.61–1.24)
Calcium: 8.2 mg/dL — ABNORMAL LOW (ref 8.9–10.3)
Chloride: 95 mmol/L — ABNORMAL LOW (ref 98–111)
GFR calc Af Amer: 44 mL/min — ABNORMAL LOW (ref >60)
GFR, EST NON AFRICAN AMERICAN: 38 mL/min — AB (ref >60)
Glucose, Bld: 88 mg/dL (ref 70–99)
Potassium: 3.1 mmol/L — ABNORMAL LOW (ref 3.5–5.1)
Sodium: 131 mmol/L — ABNORMAL LOW (ref 135–145)

## 2018-04-17 LAB — PROTIME-INR
INR: 3.16
Prothrombin Time: 31.9 seconds — ABNORMAL HIGH (ref 11.4–15.2)

## 2018-04-17 LAB — SEDIMENTATION RATE: Sed Rate: 28 mm/hr — ABNORMAL HIGH (ref 0–16)

## 2018-04-17 MED ORDER — MAGNESIUM OXIDE 400 (241.3 MG) MG PO TABS
400.0000 mg | ORAL_TABLET | Freq: Two times a day (BID) | ORAL | Status: DC
  Administered 2018-04-17 – 2018-04-18 (×2): 400 mg via ORAL
  Filled 2018-04-17 (×2): qty 1

## 2018-04-17 MED ORDER — WARFARIN SODIUM 2.5 MG PO TABS
2.5000 mg | ORAL_TABLET | Freq: Once | ORAL | Status: DC
  Filled 2018-04-17: qty 1

## 2018-04-17 MED ORDER — POTASSIUM CHLORIDE CRYS ER 20 MEQ PO TBCR
40.0000 meq | EXTENDED_RELEASE_TABLET | Freq: Every day | ORAL | Status: DC
  Administered 2018-04-17 – 2018-04-18 (×2): 40 meq via ORAL
  Filled 2018-04-17 (×2): qty 2

## 2018-04-17 MED ORDER — FUROSEMIDE 20 MG PO TABS
20.0000 mg | ORAL_TABLET | Freq: Every day | ORAL | Status: DC
  Administered 2018-04-17 – 2018-04-18 (×2): 20 mg via ORAL
  Filled 2018-04-17 (×2): qty 1

## 2018-04-17 NOTE — Progress Notes (Signed)
Orthostatic Vitals:  1508 sitting: 124/75 pulse 64 1511 standing: 101/79 pulse 62 1514 3 minutes standing: 114/82 pulse 65 1532 ambulating to bathroom: 109/73 pulse 63   Pt also ambulated in the hallway with front wheel walker approx. 100 ft. Tolerated well.

## 2018-04-17 NOTE — Plan of Care (Signed)
  Problem: Health Behavior/Discharge Planning: Goal: Ability to manage health-related needs will improve Outcome: Progressing   Problem: Pain Managment: Goal: General experience of comfort will improve Outcome: Progressing   Problem: Safety: Goal: Ability to remain free from injury will improve Outcome: Progressing   

## 2018-04-17 NOTE — Progress Notes (Signed)
[facility] PROGRESS NOTE  Christopher Butler ZDG:[id number] DOB: 08/10/1924 DOA: 04/15/2018 PCP: Lajean Manes, MD  Assessment/Plan:  Acute on chronic systolic CHF -Patient with recent admission for vtach and known EF 20-25% as of 11/29 presenting with worsening SOB and hypoxia.CXR consistent with pulmonary edema. BNP 856. Troponin negative. Of note, chart review indicates he had issues with orthostatic hypotension during his recent hospitalization and so had some med changes including d/c lisinopril and amlodipine. Also determined not a candidate for ICD It also appears he has not required diuretics to manage in past. Getting IV lasix. Volume status -6.2 liters -on lasix 20 po od -palliative care consulted  Elevated INR. Home meds include coumadin. INR is elevated but drifting down. No s/sx bleeding -hold coumadin -INR 3.1, rpt labs in am  Acute kidney injury. Likely related to lasix. Urine output good -monitor closely -hold nephrotoxins as able  HTN issues with hypotension recently.  -obtain orthostatic vs -Continue Toprol  -orthostatic vitals today-nursing is aware  HLD -Continue Lipitor  Afib -Rate controlled with Amiodarone and Toprol XL -Hold coumadin due to INR elevation  Hypothyroidism -Recent normal TSH -Continue Synthroid at current dose for now  Tobacco dependence -Encourage cessation.   -This was discussed with the patient and should be reviewed on an ongoing basis.   -Patch ordered at patient request.   Code Status: full discussed palliative care and goals of care. Lacks insight to situation Family Communication: none present wife with dementia Disposition Plan: back to facility   Consultants:  [facility]master  Palliative care  Procedures:    Antibiotics:    HPI/Subjective: Fair no sob eatign not up yet passing good urine No fever  Objective: Vitals:   04/17/18 0812 04/17/18 1230  BP: 138/81 117/74  Pulse: 62 65  Resp: 16 18   Temp: (!) 97.5 F (36.4 C) 97.8 F (36.6 C)  SpO2: 94% 96%    Intake/Output Summary (Last 24 hours) at 04/17/2018 1441 Last data filed at 04/17/2018 1200 Gross per 24 hour  Intake 320 ml  Output 2625 ml  Net -2305 ml   Filed Weights   04/16/18 0424 04/16/18 1331 04/17/18 0611  Weight: 63.6 kg 63.6 kg 61.5 kg    Exam:   General:  Thin frail chronically ill appearing  Cardiovascular: irregular irregular no mgr trace LE edema  Respiratory: normal effort Respirations shallow bs distant with faint crackles  Abdomen: non distended non tender +BS no guarding or rebounding  Musculoskeletal: joints without swelling/erythema   Data Reviewed: Basic Metabolic Panel: Recent Labs  Lab 04/15/18 0415 04/16/18 0616 04/17/18 0158  NA 130* 130* 131*  K 5.0 3.6 3.1*  CL 98 96* 95*  CO2 17* 23 23  GLUCOSE 125* 90 88  BUN 31* 32* 34*  CREATININE 1.24 1.46* 1.57*  CALCIUM 8.6* 8.2* 8.2*  MG  --   --  1.8   Liver Function Tests: Recent Labs  Lab 04/15/18 0415  AST 23  ALT 26  ALKPHOS 121  BILITOT 1.7*  PROT 6.1*  ALBUMIN 2.9*   No results for input(s): LIPASE, AMYLASE in the last 168 hours. No results for input(s): AMMONIA in the last 168 hours. CBC: Recent Labs  Lab 04/15/18 0415 04/15/18 2346  WBC 11.3* 10.7*  NEUTROABS 9.0* 7.8*  HGB 11.5* 10.5*  HCT 36.0* 31.6*  MCV 99.4 95.8  PLT 252 220   Cardiac Enzymes: Recent Labs  Lab 04/15/18 0415 04/15/18 1825 04/15/18 2346 04/16/18 0616  TROPONINI <0.03 <0.03 <0.03 <0.03  BNP (last 3 results) Recent Labs    04/15/18 1825  BNP 856.7*    ProBNP (last 3 results) No results for input(s): PROBNP in the last 8760 hours.  CBG: No results for input(s): GLUCAP in the last 168 hours.  No results found for this or any previous visit (from the past 240 hour(s)).   Studies: No results found.  Scheduled Meds: . amiodarone  200 mg Oral Daily  . aspirin EC  81 mg Oral Daily  . atorvastatin  10 mg Oral  Daily  . docusate sodium  100 mg Oral BID  . fluticasone  1 spray Each Nare Daily  . furosemide  20 mg Oral Daily  . levothyroxine  50 mcg Oral QAC breakfast  . magnesium oxide  400 mg Oral BID  . metoprolol succinate  50 mg Oral BID WC  . nicotine  14 mg Transdermal Daily  . polyethylene glycol  17 g Oral Daily  . potassium chloride  40 mEq Oral Daily  . sodium chloride flush  3 mL Intravenous Q12H  . tamsulosin  0.4 mg Oral QHS  . warfarin  2.5 mg Oral ONCE-1800  . Warfarin - Pharmacist Dosing Inpatient   Does not apply q1800   Continuous Infusions: . sodium chloride      Principal Problem:   Acute on chronic systolic CHF (congestive heart failure) (H[facility]) Active Problems:   Tobacco abuse   Hypercholesterolemia   Permanent atrial fibrillation   Essential hypertension   Acquired hypothyroidism   Elevated INR   Acute kidney injury ([place])   Goals of care, counseling/discussion   Palliative care by specialist    Time spent:     Nita Sells NP [facility]  If 7PM-7AM, please contact night-coverage at [email or web address], password [facility] 04/17/2018, 2:41 PM  LOS: 1 day

## 2018-04-17 NOTE — Progress Notes (Signed)
Progress Note  Patient Name: Christopher Butler Date of Encounter: 04/17/2018  Primary Cardiologist: Mertie Moores, MD   Subjective   Excellent diuresis. Net 3.8L/24h, 6.3L since admission. Weight down 5 lb since admission. Additional small bump in BUN/creat.  Inpatient Medications    Scheduled Meds: . amiodarone  200 mg Oral Daily  . aspirin EC  81 mg Oral Daily  . atorvastatin  10 mg Oral Daily  . docusate sodium  100 mg Oral BID  . fluticasone  1 spray Each Nare Daily  . furosemide  40 mg Intravenous Q12H  . levothyroxine  50 mcg Oral QAC breakfast  . metoprolol succinate  50 mg Oral BID WC  . nicotine  14 mg Transdermal Daily  . polyethylene glycol  17 g Oral Daily  . potassium chloride  40 mEq Oral Daily  . sodium chloride flush  3 mL Intravenous Q12H  . tamsulosin  0.4 mg Oral QHS  . Warfarin - Pharmacist Dosing Inpatient   Does not apply q1800   Continuous Infusions: . sodium chloride     PRN Meds: sodium chloride, acetaminophen, bisacodyl, LORazepam, ondansetron (ZOFRAN) IV, sodium chloride, sodium chloride flush, traMADol   Vital Signs    Vitals:   04/17/18 0004 04/17/18 0311 04/17/18 0611 04/17/18 0812  BP: 125/79 140/83  138/81  Pulse: 67 66  62  Resp: 16 (!) 25  16  Temp: 97.7 F (36.5 C) (!) 97.5 F (36.4 C)  (!) 97.5 F (36.4 C)  TempSrc: Oral Oral  Oral  SpO2: 92% 92%  94%  Weight:   61.5 kg   Height:        Intake/Output Summary (Last 24 hours) at 04/17/2018 0904 Last data filed at 04/17/2018 0865 Gross per 24 hour  Intake 200 ml  Output 2625 ml  Net -2425 ml   Filed Weights   04/16/18 0424 04/16/18 1331 04/17/18 0611  Weight: 63.6 kg 63.6 kg 61.5 kg    Telemetry    AFib, controlled rate, occ PVCs; QRS mostly broad/LBBB, but after longer R-R intervals has narrow QRS - Personally Reviewed  ECG    No new ECG - Personally Reviewed  Physical Exam  Near cachectic, severe kyphoscoliosis GEN: No acute distress.   Neck: No  JVD Cardiac: irregular, no murmurs, rubs, or gallops.  Respiratory: Clear to auscultation bilaterally. GI: Soft, nontender, non-distended  MS: No edema; No deformity. Neuro:  Nonfocal  Psych: Normal affect   Labs    Chemistry Recent Labs  Lab 04/15/18 0415 04/16/18 0616 04/17/18 0158  NA 130* 130* 131*  K 5.0 3.6 3.1*  CL 98 96* 95*  CO2 17* 23 23  GLUCOSE 125* 90 88  BUN 31* 32* 34*  CREATININE 1.24 1.46* 1.57*  CALCIUM 8.6* 8.2* 8.2*  PROT 6.1*  --   --   ALBUMIN 2.9*  --   --   AST 23  --   --   ALT 26  --   --   ALKPHOS 121  --   --   BILITOT 1.7*  --   --   GFRNONAA 51* 41* 38*  GFRAA 59* 48* 44*  ANIONGAP 15 11 13      Hematology Recent Labs  Lab 04/15/18 0415 04/15/18 2346  WBC 11.3* 10.7*  RBC 3.62* 3.30*  HGB 11.5* 10.5*  HCT 36.0* 31.6*  MCV 99.4 95.8  MCH 31.8 31.8  MCHC 31.9 33.2  RDW 13.9 13.9  PLT 252 220    Cardiac Enzymes  Recent Labs  Lab 04/15/18 0415 04/15/18 1825 04/15/18 2346 04/16/18 0616  TROPONINI <0.03 <0.03 <0.03 <0.03   No results for input(s): TROPIPOC in the last 168 hours.   BNP Recent Labs  Lab 04/15/18 1825  BNP 856.7*     DDimer No results for input(s): DDIMER in the last 168 hours.   Radiology    No results found.  Cardiac Studies   2D Echo 03/29/18 Study Conclusions  - Left ventricle: The cavity size was moderately dilated. Wall thickness was normal. Systolic function was severely reduced. The estimated ejection fraction was in the range of 20% to 25%. Diffuse hypokinesis with inferolateral akinesis. The study is not technically sufficient to allow evaluation of LV diastolic function. - Mitral valve: Mildly thickened leaflets . There was moderate regurgitation. - Left atrium: Massively dilated. - Tricuspid valve: There was mild regurgitation. - Pulmonary arteries: PA peak pressure: 46 mm Hg (S). - Systemic veins: Not visualized.  Impressions:  - Technically difficult study.  LVEF 20-25%, severe global hypokinesis with inferolateral akinesis, moderately dilated LV, moderate MR, severe LAE, mild TR, RVSP 46 mmHg + RAP, no pericardial effusion.   Patient Profile     84 y.o. male with history of acute myocardial infarction treated with thrombolytics in 1937, chronic systolic heart failure with LVEF of 20-25%, permanent atrial fibrillation, chronic anticoagulation w/ coumadin, history of COPD, hypertension, hypercholesterolemia, hypothyroidism and recent admission for several syncopal events 2/2 sustained ventricular tachycardia, who has been readmitted for acute on chronic systolic CHF  Assessment & Plan    1. Acute on Chronic Systolic CHF: recently found to have further reduction in LVEF, now at 20-25% (previously 35%). Not previously on daily scheduled lasix at home due to orthostatic hypotension. Stop IV diuretics, low dose daily furosemide PO started. Would like to monitor for another 24 hours on oral diuretic before discharge. 2. VT: recent admission for monomorphic VT, felt secondary to scar. Reduced amiodarone to 200 mg daily on this admission due to new LBBB. Continue beta-blocker, but anticipate possible need to reduce beta blocker dose in the future as the amiodarone continues to build up towards steady state. 3. CAD: No angina.History of acute myocardial infarction treated with thrombolytics in 1987. Recent diagnosis of VT. Echo with further decline in LVEF. Troponin's negative x 3. No plans for cath given advanced age and lack of symptoms, also patient preference.  4. Hypertension: issues with hypotension recently, but not an issue so far on this admission. Repeat orthostatics. Up with assistance only.  5. Chronic Atrial fibrillation: rate controlled with metoprolol. Coumadin on hold due to supratheraeputic INR > 4, now 3.1.  6. HLD: on statin therapy w/ Lipitor. Recheck LDL in a couple of months. 7. Hypothyroidism: normal TSH on current levothyroxine  dose.     For questions or updates, please contact [place] Please consult [email or web address] for contact info under        Signed, Sanda Klein, MD  04/17/2018, 9:04 AM

## 2018-04-17 NOTE — Progress Notes (Signed)
ANTICOAGULATION CONSULT NOTE  Pharmacy Consult for warfarin Indication: atrial fibrillation   Patient Measurements: Height: 5\' 3"  (160 cm) Weight: 135 lb 9.6 oz (61.5 kg) IBW/kg (Calculated) : 56.9   Vital Signs: Temp: 97.5 F (36.4 C) (12/18 0812) Temp Source: Oral (12/18 0812) BP: 138/81 (12/18 0812) Pulse Rate: 62 (12/18 0812)  Labs: Recent Labs    04/15/18 0415 04/15/18 1825 04/15/18 2346 04/16/18 0616 04/17/18 0158  HGB 11.5*  --  10.5*  --   --   HCT 36.0*  --  31.6*  --   --   PLT 252  --  220  --   --   LABPROT 45.3*  --   --  42.9* 31.9*  INR 4.95*  --   --  4.62* 3.16  CREATININE 1.24  --   --  1.46* 1.57*  TROPONINI <0.03 <0.03 <0.03 <0.03  --     Estimated Creatinine Clearance: 24.7 mL/min (A) (by C-G formula based on SCr of 1.57 mg/dL (H)).   Medical History: Past Medical History:  Diagnosis Date  . Acute myocardial infarction 1987   s/p TPA  . Anxiety   . Arthritis    "probably in my back" (04/15/2018)  . CAD (coronary artery disease)    a. remote inferior wall myocardial infarction (status post streptokinase in 1987-did not require angioplasty).  . Cholelithiases   . Chronic lower back pain   . Chronic systolic CHF (congestive heart failure) ([place])   . CKD (chronic kidney disease), stage II   . COPD (chronic obstructive pulmonary disease) ([place])   . Disc disease, degenerative, thoracic or thoracolumbar   . ED (erectile dysfunction)   . Emphysema   . Headache    "usually 2/wk" (04/15/2018)  . HTN (hypertension)   . Hypercholesteremia   . Hypertensive heart disease with chronic systolic congestive heart failure ([place])   . Hyponatremia Sept 2010  . Hypothyroidism   . Permanent atrial fibrillation   . Sleep apnea    pt denies this hx on 04/15/2018  . Tobacco abuse   . Tricuspid regurgitation     Assessment: 84 yo male admitted with HF.  On warfarin PTA for AFib. INR 4.95 on admit, down to 3.18  today. Last dose of warfarin on 12/15.    PTA warfarin: 5 mg on Mon/Wed/Fri, 2.5 mg on all other days.   Goal of Therapy:  INR 2-3 Monitor platelets by anticoagulation protocol: Yes    Plan:  -Warfarin 2.5mg  po today -Daily INR  Hildred Laser, PharmD Clinical Pharmacist **Pharmacist phone directory can now be found on [email or web address] ([email or web address]).  Listed under [place].

## 2018-04-18 ENCOUNTER — Ambulatory Visit: Payer: Medicare HMO | Admitting: Cardiology

## 2018-04-18 LAB — RENAL FUNCTION PANEL
Albumin: 2.6 g/dL — ABNORMAL LOW (ref 3.5–5.0)
Anion gap: 10 (ref 5–15)
BUN: 34 mg/dL — ABNORMAL HIGH (ref 8–23)
CO2: 27 mmol/L (ref 22–32)
Calcium: 8.5 mg/dL — ABNORMAL LOW (ref 8.9–10.3)
Chloride: 94 mmol/L — ABNORMAL LOW (ref 98–111)
Creatinine, Ser: 1.57 mg/dL — ABNORMAL HIGH (ref 0.61–1.24)
GFR calc Af Amer: 44 mL/min — ABNORMAL LOW (ref >60)
GFR calc non Af Amer: 38 mL/min — ABNORMAL LOW (ref >60)
Glucose, Bld: 90 mg/dL (ref 70–99)
POTASSIUM: 4.8 mmol/L (ref 3.5–5.1)
Phosphorus: 2.7 mg/dL (ref 2.5–4.6)
Sodium: 131 mmol/L — ABNORMAL LOW (ref 135–145)

## 2018-04-18 LAB — PROTIME-INR
INR: 2.23
Prothrombin Time: 24.4 seconds — ABNORMAL HIGH (ref 11.4–15.2)

## 2018-04-18 MED ORDER — WARFARIN SODIUM 2.5 MG PO TABS: 2.5000 mg | ORAL_TABLET | Freq: Once | ORAL | 0 refills | Status: DC

## 2018-04-18 MED ORDER — FUROSEMIDE 20 MG PO TABS: 20.0000 mg | ORAL_TABLET | Freq: Every day | ORAL | 1 refills | Status: AC

## 2018-04-18 MED ORDER — POTASSIUM CHLORIDE CRYS ER 20 MEQ PO TBCR: 40.0000 meq | EXTENDED_RELEASE_TABLET | Freq: Every day | ORAL | 0 refills | Status: AC

## 2018-04-18 MED ORDER — NICOTINE 14 MG/24HR TD PT24: 14.0000 mg | MEDICATED_PATCH | Freq: Every day | TRANSDERMAL | 0 refills | Status: DC

## 2018-04-18 MED ORDER — AMIODARONE HCL 200 MG PO TABS: 200.0000 mg | ORAL_TABLET | Freq: Every day | ORAL | 0 refills | Status: DC

## 2018-04-18 MED ORDER — MAGNESIUM OXIDE 400 (241.3 MG) MG PO TABS: 400.0000 mg | ORAL_TABLET | Freq: Two times a day (BID) | ORAL | 0 refills | Status: AC

## 2018-04-18 NOTE — Progress Notes (Signed)
Physical Therapy Treatment Patient Details Name: Christopher Butler MRN: [id number] DOB: 02/17/1925 Today's Date: 04/18/2018    History of Present Illness Patient is a 84 y/o male who presents with CP, SOB and syncope and found to have ventricular tachycardia with rates >200. PMH includes A-fib, HTN, COPD, CKd, CAD, MI.    PT Comments    Patient is progressing very well towards their physical therapy goals. Ambulating 200 feet with walker and min guard assist. Denies shortness of breath and VSS. Educated patient on generalized walking program and activity progression recommendations. D/c plan remains appropriate.    Follow Up Recommendations  Supervision for mobility/OOB;Home health PT     Equipment Recommendations  None recommended by PT (has RW)   Recommendations for Other Services       Precautions / Restrictions Precautions Precautions: Fall Restrictions Weight Bearing Restrictions: No    Mobility  Bed Mobility Overal bed mobility: Modified Independent                Transfers Overall transfer level: Needs assistance Equipment used: Rolling walker (2 wheeled) Transfers: Sit to/from Stand Sit to Stand: Supervision         General transfer comment: cues for hand placement  Ambulation/Gait Ambulation/Gait assistance: Min guard Gait Distance (Feet): 200 Feet Assistive device: Rolling walker (2 wheeled) Gait Pattern/deviations: Step-through pattern;Decreased stride length;Narrow base of support;Trunk flexed Gait velocity: decreased   General Gait Details: Steady gait with RW, but did utilize narrow BOS and occasional scissoring. tending to look at feet mainly   Stairs             Wheelchair Mobility    Modified Rankin (Stroke Patients Only)       Balance Overall balance assessment: Needs assistance;History of Falls Sitting-balance support: Feet supported;No upper extremity supported Sitting balance-Leahy Scale: Good     Standing balance  support: During functional activity;Bilateral upper extremity supported Standing balance-Leahy Scale: Poor Standing balance comment: Requires external support for standing and RW.                            Cognition Arousal/Alertness: Awake/alert Behavior During Therapy: WFL for tasks assessed/performed Overall Cognitive Status: No family/caregiver present to determine baseline cognitive functioning Area of Impairment: Memory                     Memory: Decreased short-term memory         General Comments: A&Ox3      Exercises      General Comments  VSS      Pertinent Vitals/Pain Pain Assessment: No/denies pain    Home Living                      Prior Function            PT Goals (current goals can now be found in the care plan section) Acute Rehab PT Goals Patient Stated Goal: to get better Potential to Achieve Goals: Good Progress towards PT goals: Progressing toward goals    Frequency    Min 3X/week      PT Plan Current plan remains appropriate    Co-evaluation              AM-PAC PT "6 Clicks" Mobility   Outcome Measure  Help needed turning from your back to your side while in a flat bed without using bedrails?: None Help needed moving from lying on your  back to sitting on the side of a flat bed without using bedrails?: None Help needed moving to and from a bed to a chair (including a wheelchair)?: None Help needed standing up from a chair using your arms (e.g., wheelchair or bedside chair)?: None Help needed to walk in hospital room?: A Little Help needed climbing 3-5 steps with a railing? : A Lot 6 Click Score: 21    End of Session Equipment Utilized During Treatment: Gait belt Activity Tolerance: Patient tolerated treatment well Patient left: with call bell/phone within reach;in bed Nurse Communication: Mobility status PT Visit Diagnosis: Unsteadiness on feet (R26.81);History of falling  (Z91.81);Difficulty in walking, not elsewhere classified (R26.2)     Time: 7943-2761 PT Time Calculation (min) (ACUTE ONLY): 13 min  Charges:  $Therapeutic Activity: 8-22 mins                    Ellamae Sia, PT, DPT Acute Rehabilitation Services Pager [phone] Office [phone]   Willy Eddy 04/18/2018, 9:52 AM

## 2018-04-18 NOTE — Discharge Summary (Signed)
Physician Discharge Summary  Christopher Butler XNA:[id number] DOB: 1925/02/11 DOA: 04/15/2018  PCP: Lajean Manes, MD  Admit date: 04/15/2018 Discharge date: 04/18/2018  Time spent: 45 minutes  Recommendations for Outpatient Follow-up:  1. No dosage changes of amiodarone downwards, addition of Lasix as well as magnesium and potassium and limited prescription of Coumadin this admission 2. Needs INR check in 1 week 3. Needs complete metabolic panel TSH in about 2 to 3 weeks 4. Recommend outpatient cardiology follow-up for transitional visit  Discharge Diagnoses:  Principal Problem:   Acute on chronic systolic CHF (congestive heart failure) (HCC) Active Problems:   Tobacco abuse   Hypercholesterolemia   Permanent atrial fibrillation   Essential hypertension   Acquired hypothyroidism   Elevated INR   Acute kidney injury ([place])   Goals of care, counseling/discussion   Palliative care by specialist   Discharge Condition: Improved  Diet recommendation: Heart healthy  Filed Weights   04/16/18 0424 04/16/18 1331 04/17/18 0611  Weight: 63.6 kg 63.6 kg 61.5 kg    History of present illness:  84 year old male prior MI treated with thrombolytics 5427 Chronic systolic failure LVEF 06% Permanent A. fib, COPD HTN HLD hypothyroidism with recent admission 1128-12/6 for V. tach Return to the hospital on 12/16 with shortness of breath night sweats etc. etc. Found to be in acute heart failure  Hospital Course:  Acute on chronic systolic CHF -Patient withrecent admission for vtach and known EF 20-25% as of 11/29presenting with worsening SOB and hypoxia.CXR consistent with pulmonary edema. BNP 856. Troponin negative. Of note, chart review indicates he had issues with orthostatic hypotension during his recent hospitalization-now off amlodipine/lisinopril For this admission volume status -6.1 liters -on lasix 20 po od -palliative care consulted and he is now a DNR  Elevated INR. Home meds  include coumadin. INR is elevated but drifting down. No s/sx bleeding -On discharge INR was 2.23-hospitalization was notable for slightly elevated INR is in the 4 range We will let him go home on 2.5 mg and recheck as an outpatient  Acute kidney injury. Likely related to lasix. Urine output good -Needs recheck of renal function in the next 1 to 2 weeks-on discharge BUN/creatinine 34/1.5  HTN issues with hypotension recently.  -obtain orthostatic vs -Continue Toprol  -orthostatic vitals today-nursing is aware  HLD -Continue Lipitor  Afib -Rate controlled with Amiodarone and Toprol XL -Magnesium was replaced with 400 mg and will need recheck in a couple of weeks  Hypothyroidism -Recent normalTSH -Continue Synthroid at current dose for now  Tobacco dependence -Encourage cessation.  -This was discussed with the patient and should be reviewed on an ongoing basis.  -Patch ordered at patient request.  Consultations:  Cardiology  Discharge Exam: Vitals:   04/18/18 0338 04/18/18 0823  BP: 130/75 122/65  Pulse: (!) 58 62  Resp: 20 (!) 22  Temp: 98.5 F (36.9 C)   SpO2: 92% 94%    General: Awake alert pleasant no distress eating and drinking no chest pain ambulatory Cardiovascular: S1-S2 rate control sinus rhythm Respiratory: Clinically clear no added sound Abdomen soft nontender No lower extremity edema however some bruising  Discharge Instructions   Discharge Instructions    Diet - low sodium heart healthy   Complete by:  As directed    Discharge instructions   Complete by:  As directed    You will notice the dosage changes of numerous medications including your amiodarone which is dropped in dose We have called in some supplements for magnesium and  potassium as an outpatient You will need to start a new fluid medication called Lasix and take this every day it causes you to pass more urine so check your blood pressures and make sure you do not feel  dehydrated but I would not drink over 1.2 L in the winter and 1.5 in the summer you will need labs at your regular primary physician's office in about a couple of weeks. Please also make sure that you check your Coumadin numbers in the next 3 to 4 days after only prescribed a limited amount of your warfarin so that this will ensure that you get this checked and refilled   Increase activity slowly   Complete by:  As directed      Allergies as of 04/18/2018      Reactions   Ceclor [cefaclor]    Erythromycin       Medication List    TAKE these medications   amiodarone 200 MG tablet Commonly known as:  PACERONE Take 1 tablet (200 mg total) by mouth daily. What changed:    medication strength  how much to take   aspirin 81 MG EC tablet Take 1 tablet (81 mg total) by mouth daily.   atorvastatin 10 MG tablet Commonly known as:  LIPITOR Take 1 tablet (10 mg total) by mouth daily.   furosemide 20 MG tablet Commonly known as:  LASIX Take 1 tablet (20 mg total) by mouth daily.   levothyroxine 50 MCG tablet Commonly known as:  SYNTHROID, LEVOTHROID Take 50 mcg by mouth daily before breakfast.   LORazepam 0.5 MG tablet Commonly known as:  ATIVAN Take 0.5 mg by mouth daily as needed for anxiety.   magnesium oxide 400 (241.3 Mg) MG tablet Commonly known as:  MAG-OX Take 1 tablet (400 mg total) by mouth 2 (two) times daily.   metoprolol succinate 50 MG 24 hr tablet Commonly known as:  TOPROL-XL Take 1 tablet (50 mg total) by mouth 2 (two) times daily with a meal. Take with or immediately following a meal.   nicotine 14 mg/24hr patch Commonly known as:  NICODERM CQ - dosed in mg/24 hours Place 1 patch (14 mg total) onto the skin daily.   nitroGLYCERIN 0.4 MG SL tablet Commonly known as:  NITROSTAT Place 1 tablet (0.4 mg total) under the tongue every 5 (five) minutes as needed for chest pain.   potassium chloride SA 20 MEQ tablet Commonly known as:  K-DUR,KLOR-CON Take 2  tablets (40 mEq total) by mouth daily. What changed:  when to take this   tamsulosin 0.4 MG Caps capsule Commonly known as:  FLOMAX Take 0.4 mg by mouth at bedtime.   traMADol 50 MG tablet Commonly known as:  ULTRAM Take 1 tablet (50 mg total) by mouth 2 (two) times daily as needed (for pain.). ADDITIONAL REFILLS FROM PRIMARY CARE PHYSICIAN What changed:  additional instructions   warfarin 2.5 MG tablet Commonly known as:  COUMADIN Take as directed. If you are unsure how to take this medication, talk to your nurse or doctor. Original instructions:  Take 1 tablet (2.5 mg total) by mouth one time only at 6 PM. What changed:    medication strength  See the new instructions.      Allergies  Allergen Reactions  . Ceclor [Cefaclor]   . Erythromycin       The results of significant diagnostics from this hospitalization (including imaging, microbiology, ancillary and laboratory) are listed below for reference.    Significant Diagnostic  Studies: Dg Chest 2 View  Result Date: 03/27/2018 CLINICAL DATA:  Cough, shortness of breath. EXAM: CHEST - 2 VIEW COMPARISON:  Radiographs of October 24, 2011. FINDINGS: Stable cardiomegaly. No pneumothorax or pleural effusion is noted. Atherosclerosis of thoracic aorta is noted. Mild bibasilar subsegmental atelectasis or scarring is noted. Bony thorax is unremarkable. IMPRESSION: Mild bibasilar subsegmental atelectasis or scarring. Aortic Atherosclerosis (ICD10-I70.0). Electronically Signed   By: Marijo Conception, M.D.   On: 03/27/2018 10:30   Dg Chest Portable 1 View  Result Date: 04/15/2018 CLINICAL DATA:  Chest pain tonight. EXAM: PORTABLE CHEST 1 VIEW COMPARISON:  Radiograph 03/29/2018 FINDINGS: Unchanged cardiomegaly with aortic atherosclerosis and tortuosity. Progression in reticular opacities consistent with pulmonary edema. Blunting of the costophrenic angles appears similar to prior exam. No new airspace disease or pneumothorax. Unchanged  osseous structures with significant scoliotic curvature. IMPRESSION: Progressive pulmonary edema since last month. Unchanged cardiomegaly and aortic atherosclerosis. Aortic Atherosclerosis (ICD10-I70.0). Electronically Signed   By: Keith Rake M.D.   On: 04/15/2018 04:32   Dg Chest Port 1 View  Result Date: 03/29/2018 CLINICAL DATA:  Wheezing EXAM: PORTABLE CHEST 1 VIEW COMPARISON:  March 28, 2018 FINDINGS: There is interstitial prominence in the bases, likely mild interstitial edema. There is no frank airspace consolidation. There is cardiomegaly with pulmonary vascularity within normal limits. There is aortic atherosclerosis. No adenopathy. No bone lesions. IMPRESSION: Cardiomegaly with suspected bibasilar interstitial edema. Lungs elsewhere clear. There is aortic atherosclerosis. Aortic Atherosclerosis (ICD10-I70.0). Electronically Signed   By: Lowella Grip III M.D.   On: 03/29/2018 09:30   Dg Chest Port 1 View  Result Date: 03/28/2018 CLINICAL DATA:  Chest pain EXAM: PORTABLE CHEST 1 VIEW COMPARISON:  March 27, 2018 FINDINGS: The study is limited due to patient rotation. Cardiomegaly remains. There is a tortuous thoracic aorta which is similar in the interval. No change in the heart, hila, or mediastinum. No pneumothorax. No overt edema. Mild bibasilar atelectasis. No other acute abnormalities. IMPRESSION: 1. Limited study due to the low volume portable technique. Within these limitations, there appears to be mild bibasilar atelectasis and no other change. 2. The torturous thoracic aorta is stable radiographically. Electronically Signed   By: Dorise Bullion III M.D   On: 03/28/2018 14:55    Microbiology: No results found for this or any previous visit (from the past 240 hour(s)).   Labs: Basic Metabolic Panel: Recent Labs  Lab 04/15/18 0415 04/16/18 0616 04/17/18 0158 04/18/18 0157  NA 130* 130* 131* 131*  K 5.0 3.6 3.1* 4.8  CL 98 96* 95* 94*  CO2 17* 23 23 27    GLUCOSE 125* 90 88 90  BUN 31* 32* 34* 34*  CREATININE 1.24 1.46* 1.57* 1.57*  CALCIUM 8.6* 8.2* 8.2* 8.5*  MG  --   --  1.8  --   PHOS  --   --   --  2.7   Liver Function Tests: Recent Labs  Lab 04/15/18 0415 04/18/18 0157  AST 23  --   ALT 26  --   ALKPHOS 121  --   BILITOT 1.7*  --   PROT 6.1*  --   ALBUMIN 2.9* 2.6*   No results for input(s): LIPASE, AMYLASE in the last 168 hours. No results for input(s): AMMONIA in the last 168 hours. CBC: Recent Labs  Lab 04/15/18 0415 04/15/18 2346  WBC 11.3* 10.7*  NEUTROABS 9.0* 7.8*  HGB 11.5* 10.5*  HCT 36.0* 31.6*  MCV 99.4 95.8  PLT 252 220  Cardiac Enzymes: Recent Labs  Lab 04/15/18 0415 04/15/18 1825 04/15/18 2346 04/16/18 0616  TROPONINI <0.03 <0.03 <0.03 <0.03   BNP: BNP (last 3 results) Recent Labs    04/15/18 1825  BNP 856.7*    ProBNP (last 3 results) No results for input(s): PROBNP in the last 8760 hours.  CBG: No results for input(s): GLUCAP in the last 168 hours.     Signed:  Nita Sells MD   [facility] 04/18/2018, 8:47 AM

## 2018-04-18 NOTE — Progress Notes (Signed)
Progress Note  Patient Name: Christopher Butler Date of Encounter: 04/18/2018  Primary Cardiologist: Mertie Moores, MD   Subjective   [facility] hall without dyspnea. No major arrhythmia. Matched in/out. No weight today. 135 lb yesterday is new estimated "dry weight"  Inpatient Medications    Scheduled Meds: . amiodarone  200 mg Oral Daily  . aspirin EC  81 mg Oral Daily  . atorvastatin  10 mg Oral Daily  . docusate sodium  100 mg Oral BID  . fluticasone  1 spray Each Nare Daily  . furosemide  20 mg Oral Daily  . levothyroxine  50 mcg Oral QAC breakfast  . magnesium oxide  400 mg Oral BID  . metoprolol succinate  50 mg Oral BID WC  . nicotine  14 mg Transdermal Daily  . polyethylene glycol  17 g Oral Daily  . potassium chloride  40 mEq Oral Daily  . sodium chloride flush  3 mL Intravenous Q12H  . tamsulosin  0.4 mg Oral QHS  . warfarin  2.5 mg Oral ONCE-1800  . Warfarin - Pharmacist Dosing Inpatient   Does not apply q1800   Continuous Infusions: . sodium chloride     PRN Meds: sodium chloride, acetaminophen, bisacodyl, LORazepam, ondansetron (ZOFRAN) IV, sodium chloride, sodium chloride flush, traMADol   Vital Signs    Vitals:   04/17/18 2020 04/17/18 2354 04/18/18 0338 04/18/18 0823  BP: 123/65 130/77 130/75 122/65  Pulse: (!) 112 64 (!) 58 62  Resp: (!) 21 15 20  (!) 22  Temp: (!) 97.5 F (36.4 C) 98.1 F (36.7 C) 98.5 F (36.9 C)   TempSrc: Oral Oral Oral   SpO2: 95% 98% 92% 94%  Weight:      Height:        Intake/Output Summary (Last 24 hours) at 04/18/2018 1123 Last data filed at 04/18/2018 0500 Gross per 24 hour  Intake 360 ml  Output 175 ml  Net 185 ml   Filed Weights   04/16/18 0424 04/16/18 1331 04/17/18 0611  Weight: 63.6 kg 63.6 kg 61.5 kg    Telemetry    Afib, controlled rate - Personally Reviewed  ECG    n/a - Personally Reviewed  Physical Exam  Smiling, comfortable. Kyphoscoliosis. GEN: No acute distress.   Neck: No  JVD Cardiac: irregular, no murmurs, rubs, or gallops.  Respiratory: Clear to auscultation bilaterally. GI: Soft, nontender, non-distended  MS: No edema; No deformity. Neuro:  Nonfocal  Psych: Normal affect   Labs    Chemistry Recent Labs  Lab 04/15/18 0415 04/16/18 0616 04/17/18 0158 04/18/18 0157  NA 130* 130* 131* 131*  K 5.0 3.6 3.1* 4.8  CL 98 96* 95* 94*  CO2 17* 23 23 27   GLUCOSE 125* 90 88 90  BUN 31* 32* 34* 34*  CREATININE 1.24 1.46* 1.57* 1.57*  CALCIUM 8.6* 8.2* 8.2* 8.5*  PROT 6.1*  --   --   --   ALBUMIN 2.9*  --   --  2.6*  AST 23  --   --   --   ALT 26  --   --   --   ALKPHOS 121  --   --   --   BILITOT 1.7*  --   --   --   GFRNONAA 51* 41* 38* 38*  GFRAA 59* 48* 44* 44*  ANIONGAP 15 11 13 10      Hematology Recent Labs  Lab 04/15/18 0415 04/15/18 2346  WBC 11.3* 10.7*  RBC 3.62* 3.30*  HGB 11.5* 10.5*  HCT 36.0* 31.6*  MCV 99.4 95.8  MCH 31.8 31.8  MCHC 31.9 33.2  RDW 13.9 13.9  PLT 252 220    Cardiac Enzymes Recent Labs  Lab 04/15/18 0415 04/15/18 1825 04/15/18 2346 04/16/18 0616  TROPONINI <0.03 <0.03 <0.03 <0.03   No results for input(s): TROPIPOC in the last 168 hours.   BNP Recent Labs  Lab 04/15/18 1825  BNP 856.7*     DDimer No results for input(s): DDIMER in the last 168 hours.   Radiology    No results found.  Cardiac Studies   2D Echo 03/29/18 Study Conclusions  - Left ventricle: The cavity size was moderately dilated. Wall thickness was normal. Systolic function was severely reduced. The estimated ejection fraction was in the range of 20% to 25%. Diffuse hypokinesis with inferolateral akinesis. The study is not technically sufficient to allow evaluation of LV diastolic function. - Mitral valve: Mildly thickened leaflets . There was moderate regurgitation. - Left atrium: Massively dilated. - Tricuspid valve: There was mild regurgitation. - Pulmonary arteries: PA peak pressure: 46 mm Hg  (S). - Systemic veins: Not visualized.  Impressions:  - Technically difficult study. LVEF 20-25%, severe global hypokinesis with inferolateral akinesis, moderately dilated LV, moderate MR, severe LAE, mild TR, RVSP 46 mmHg + RAP, no pericardial effusion.  Patient Profile     84 y.o. male with history of acute myocardial infarction treated with thrombolytics in 9417, chronic systolic heart failure with LVEFof 20-25%, permanent atrial fibrillation,chronic anticoagulation w/ coumadin,history of COPD, hypertension,hypercholesterolemia, hypothyroidismand recent admission forseveral syncopal events 2/2sustained ventricular tachycardia, who has been readmitted for acute on chronic systolic CHF   Assessment & Plan    1. Acute on Chronic Systolic EYC:[id number] found to have further reduction in LVEF, now at 20-25% (previously 35%). Started low dose daily furosemide PO, watch for orthostatic hypotension. Daily weights reinforced, call office for weight gain >3 lb /24h or 5 lb/ week. Current estimated dry weight 135 lb on hospital scale - asked him to weigh himself when he gets home today. Bring weight log to office. 2. DJ:[id number] reduced to 200 mg daily for new LBBB. No VT during this admission. 3. CAD:No angina.History of acute myocardial infarction treated with thrombolytics in 1987. Recent diagnosis of VT. Echo with further decline in LVEF. Troponin's negative x 3. No plans for cath given advanced age and lack of symptoms, also patient preference. 4. Chronic Atrial fibrillation: rate controlled with metoprolol. INR 2.2 today. Was supratherapeutic, likely due to amiodarone interaction (+/- hepatic congestion). Will arrange INR follow up next week. 6. HLD: on statin therapy w/ Lipitor. Recheck LDL in a couple of months. 7. Hypothyroidism: normal TSH on current levothyroxine dose.  [facility] will sign off.   Medication Recommendations:  Amiodarone 200 mg daily, furosemide  20 mg daily Other recommendations (labs, testing, etc):  INR in coumadin clinic 04/25/2018 Follow up as an outpatient:  TOC follow up w Melina Copa and BMET 05/02/2018  For questions or updates, please contact [place] Please consult [email or web address] for contact info under        Signed, Sanda Klein, MD  04/18/2018, 11:23 AM

## 2018-04-19 DIAGNOSIS — I4821 Permanent atrial fibrillation: Secondary | ICD-10-CM | POA: Diagnosis not present

## 2018-04-19 DIAGNOSIS — I130 Hypertensive heart and chronic kidney disease with heart failure and stage 1 through stage 4 chronic kidney disease, or unspecified chronic kidney disease: Secondary | ICD-10-CM | POA: Diagnosis not present

## 2018-04-19 DIAGNOSIS — I2510 Atherosclerotic heart disease of native coronary artery without angina pectoris: Secondary | ICD-10-CM | POA: Diagnosis not present

## 2018-04-19 DIAGNOSIS — I771 Stricture of artery: Secondary | ICD-10-CM | POA: Diagnosis not present

## 2018-04-19 DIAGNOSIS — I429 Cardiomyopathy, unspecified: Secondary | ICD-10-CM | POA: Diagnosis not present

## 2018-04-19 DIAGNOSIS — I5022 Chronic systolic (congestive) heart failure: Secondary | ICD-10-CM | POA: Diagnosis not present

## 2018-04-19 DIAGNOSIS — I472 Ventricular tachycardia: Secondary | ICD-10-CM | POA: Diagnosis not present

## 2018-04-19 DIAGNOSIS — I252 Old myocardial infarction: Secondary | ICD-10-CM | POA: Diagnosis not present

## 2018-04-19 DIAGNOSIS — I361 Nonrheumatic tricuspid (valve) insufficiency: Secondary | ICD-10-CM | POA: Diagnosis not present

## 2018-04-19 DIAGNOSIS — N182 Chronic kidney disease, stage 2 (mild): Secondary | ICD-10-CM | POA: Diagnosis not present

## 2018-04-22 ENCOUNTER — Telehealth: Payer: Self-pay | Admitting: Pharmacist

## 2018-04-22 ENCOUNTER — Encounter: Payer: Self-pay | Admitting: Physician Assistant

## 2018-04-22 NOTE — Telephone Encounter (Signed)
Pt daughter called to cancel upcoming appt for INR check. She states that [facility] is coming to the home and can manage through them. He has been hospitalized several times recently. INR therapeutic on discharge. In office appt cancelled.   Spoke with Sharyn Lull, RN with [facility] and gave order to check INR at next home visit (on Monday). She will call result to our office while in home.

## 2018-05-02 ENCOUNTER — Other Ambulatory Visit: Payer: Medicare Other | Admitting: *Deleted

## 2018-05-02 ENCOUNTER — Ambulatory Visit (INDEPENDENT_AMBULATORY_CARE_PROVIDER_SITE_OTHER): Payer: Medicare Other

## 2018-05-02 ENCOUNTER — Ambulatory Visit: Payer: Medicare HMO | Admitting: Physician Assistant

## 2018-05-02 DIAGNOSIS — I4891 Unspecified atrial fibrillation: Secondary | ICD-10-CM

## 2018-05-02 DIAGNOSIS — Z5181 Encounter for therapeutic drug level monitoring: Secondary | ICD-10-CM

## 2018-05-02 LAB — PROTIME-INR
INR: >10 (ref 0.8–1.2)
INR: 8.37
Prothrombin Time: 68 seconds — ABNORMAL HIGH (ref 11.4–15.2)

## 2018-05-02 LAB — POCT INR: INR: 8 — AB (ref 2.0–3.0)

## 2018-05-02 NOTE — Patient Instructions (Signed)
Description   Annasha, RN [facility] called INR >8.0, instructed RN to advise pt to go to ED with bleeding problems, do not take Warfarin until further instructed by Coumadin Clinic.  Stat venipuncture done in home, [facility] will call result once received. RN called back does not have supplies to do STAT venipuncture, pt will come to office for STAT INR appt made, Lanny Hurst aware. INR 8.37 at [facility] lab called pt's daughter advised to hold Warfarin until INR check on Monday.  Glena Norfolk, RN [facility] she took all the Warfarin out of pt's pill box aware we will hold until recheck on Monday.

## 2018-05-06 ENCOUNTER — Ambulatory Visit (INDEPENDENT_AMBULATORY_CARE_PROVIDER_SITE_OTHER): Payer: Medicare Other | Admitting: Cardiovascular Disease

## 2018-05-06 DIAGNOSIS — Z5181 Encounter for therapeutic drug level monitoring: Secondary | ICD-10-CM

## 2018-05-06 LAB — POCT INR: INR: 5.4 — AB (ref 2.0–3.0)

## 2018-05-13 ENCOUNTER — Ambulatory Visit (INDEPENDENT_AMBULATORY_CARE_PROVIDER_SITE_OTHER): Payer: Medicare Other | Admitting: Interventional Cardiology

## 2018-05-13 DIAGNOSIS — Z5181 Encounter for therapeutic drug level monitoring: Secondary | ICD-10-CM

## 2018-05-13 LAB — POCT INR: INR: 3.7 — AB (ref 2.0–3.0)

## 2018-05-21 ENCOUNTER — Ambulatory Visit (INDEPENDENT_AMBULATORY_CARE_PROVIDER_SITE_OTHER): Payer: Medicare Other | Admitting: Pharmacist

## 2018-05-21 ENCOUNTER — Encounter (INDEPENDENT_AMBULATORY_CARE_PROVIDER_SITE_OTHER): Payer: Self-pay

## 2018-05-21 DIAGNOSIS — Z5181 Encounter for therapeutic drug level monitoring: Secondary | ICD-10-CM

## 2018-05-21 DIAGNOSIS — I4891 Unspecified atrial fibrillation: Secondary | ICD-10-CM | POA: Diagnosis not present

## 2018-05-21 LAB — POCT INR: INR: 4.6 — AB (ref 2.0–3.0)

## 2018-05-21 NOTE — Patient Instructions (Signed)
Skip today's and tomorrow's dose then start taking 2.5mg  daily except 5mg  on Mondays and Fridays. Recheck INR in 1 week. Seek medical attention for any unusual bleeding or bruising.

## 2018-05-28 ENCOUNTER — Ambulatory Visit (INDEPENDENT_AMBULATORY_CARE_PROVIDER_SITE_OTHER): Payer: Medicare Other

## 2018-05-28 DIAGNOSIS — Z5181 Encounter for therapeutic drug level monitoring: Secondary | ICD-10-CM

## 2018-05-28 DIAGNOSIS — I4891 Unspecified atrial fibrillation: Secondary | ICD-10-CM | POA: Diagnosis not present

## 2018-05-28 LAB — POCT INR: INR: 4 — AB (ref 2.0–3.0)

## 2018-05-28 NOTE — Patient Instructions (Signed)
Skip today's and tomorrow's dose then START NEW DOSAGE of 2.5mg  daily EXCEPT 5mg  on [place].  Recheck INR in 2 weeks.  Seek medical attention for any unusual bleeding or bruising.

## 2018-06-11 ENCOUNTER — Ambulatory Visit (INDEPENDENT_AMBULATORY_CARE_PROVIDER_SITE_OTHER): Payer: Medicare Other

## 2018-06-11 DIAGNOSIS — Z5181 Encounter for therapeutic drug level monitoring: Secondary | ICD-10-CM

## 2018-06-11 DIAGNOSIS — I4891 Unspecified atrial fibrillation: Secondary | ICD-10-CM | POA: Diagnosis not present

## 2018-06-11 LAB — POCT INR: INR: 5.3 — AB (ref 2.0–3.0)

## 2018-06-11 NOTE — Patient Instructions (Signed)
Please skip coumadin today, tomorrow, & Thursday, then START NEW DOSAGE of 2.5mg  daily.  Recheck INR in 2 weeks.  Seek medical attention for any unusual bleeding or bruising.

## 2018-06-24 ENCOUNTER — Emergency Department (HOSPITAL_COMMUNITY): Payer: Medicare Other

## 2018-06-24 ENCOUNTER — Other Ambulatory Visit: Payer: Self-pay

## 2018-06-24 ENCOUNTER — Encounter (HOSPITAL_COMMUNITY): Payer: Self-pay

## 2018-06-24 ENCOUNTER — Observation Stay (HOSPITAL_COMMUNITY)
Admission: EM | Admit: 2018-06-24 | Discharge: 2018-06-25 | Disposition: A | Discharge status: Home / Self Care | Payer: Medicare Other | Attending: Internal Medicine | Admitting: Internal Medicine

## 2018-06-24 DIAGNOSIS — I130 Hypertensive heart and chronic kidney disease with heart failure and stage 1 through stage 4 chronic kidney disease, or unspecified chronic kidney disease: Secondary | ICD-10-CM | POA: Diagnosis not present

## 2018-06-24 DIAGNOSIS — E039 Hypothyroidism, unspecified: Secondary | ICD-10-CM | POA: Diagnosis not present

## 2018-06-24 DIAGNOSIS — S0990XA Unspecified injury of head, initial encounter: Secondary | ICD-10-CM | POA: Insufficient documentation

## 2018-06-24 DIAGNOSIS — I447 Left bundle-branch block, unspecified: Secondary | ICD-10-CM | POA: Diagnosis not present

## 2018-06-24 DIAGNOSIS — I4821 Permanent atrial fibrillation: Secondary | ICD-10-CM | POA: Diagnosis not present

## 2018-06-24 DIAGNOSIS — I482 Chronic atrial fibrillation, unspecified: Secondary | ICD-10-CM | POA: Diagnosis not present

## 2018-06-24 DIAGNOSIS — M545 Low back pain: Secondary | ICD-10-CM | POA: Diagnosis not present

## 2018-06-24 DIAGNOSIS — Z66 Do not resuscitate: Secondary | ICD-10-CM | POA: Insufficient documentation

## 2018-06-24 DIAGNOSIS — F17210 Nicotine dependence, cigarettes, uncomplicated: Secondary | ICD-10-CM | POA: Diagnosis not present

## 2018-06-24 DIAGNOSIS — Z79890 Hormone replacement therapy (postmenopausal): Secondary | ICD-10-CM | POA: Diagnosis not present

## 2018-06-24 DIAGNOSIS — I5023 Acute on chronic systolic (congestive) heart failure: Secondary | ICD-10-CM | POA: Diagnosis present

## 2018-06-24 DIAGNOSIS — G4730 Sleep apnea, unspecified: Secondary | ICD-10-CM | POA: Insufficient documentation

## 2018-06-24 DIAGNOSIS — W19XXXA Unspecified fall, initial encounter: Secondary | ICD-10-CM | POA: Diagnosis not present

## 2018-06-24 DIAGNOSIS — Z7901 Long term (current) use of anticoagulants: Secondary | ICD-10-CM | POA: Diagnosis not present

## 2018-06-24 DIAGNOSIS — N529 Male erectile dysfunction, unspecified: Secondary | ICD-10-CM | POA: Diagnosis not present

## 2018-06-24 DIAGNOSIS — F419 Anxiety disorder, unspecified: Secondary | ICD-10-CM | POA: Diagnosis not present

## 2018-06-24 DIAGNOSIS — N400 Enlarged prostate without lower urinary tract symptoms: Secondary | ICD-10-CM | POA: Insufficient documentation

## 2018-06-24 DIAGNOSIS — I2510 Atherosclerotic heart disease of native coronary artery without angina pectoris: Secondary | ICD-10-CM | POA: Diagnosis not present

## 2018-06-24 DIAGNOSIS — G8929 Other chronic pain: Secondary | ICD-10-CM | POA: Diagnosis not present

## 2018-06-24 DIAGNOSIS — N183 Chronic kidney disease, stage 3 unspecified: Secondary | ICD-10-CM | POA: Diagnosis present

## 2018-06-24 DIAGNOSIS — Z7982 Long term (current) use of aspirin: Secondary | ICD-10-CM | POA: Insufficient documentation

## 2018-06-24 DIAGNOSIS — I252 Old myocardial infarction: Secondary | ICD-10-CM | POA: Diagnosis not present

## 2018-06-24 DIAGNOSIS — J449 Chronic obstructive pulmonary disease, unspecified: Secondary | ICD-10-CM | POA: Insufficient documentation

## 2018-06-24 DIAGNOSIS — E7800 Pure hypercholesterolemia, unspecified: Secondary | ICD-10-CM | POA: Diagnosis not present

## 2018-06-24 DIAGNOSIS — I10 Essential (primary) hypertension: Secondary | ICD-10-CM | POA: Diagnosis present

## 2018-06-24 DIAGNOSIS — Y9301 Activity, walking, marching and hiking: Secondary | ICD-10-CM | POA: Insufficient documentation

## 2018-06-24 DIAGNOSIS — Z79899 Other long term (current) drug therapy: Secondary | ICD-10-CM | POA: Diagnosis not present

## 2018-06-24 LAB — URINALYSIS, ROUTINE W REFLEX MICROSCOPIC
Bilirubin Urine: NEGATIVE
Glucose, UA: NEGATIVE mg/dL
Ketones, ur: NEGATIVE mg/dL
Nitrite: NEGATIVE
Protein, ur: NEGATIVE mg/dL
Specific Gravity, Urine: 1.018 (ref 1.005–1.030)
pH: 6 (ref 5.0–8.0)

## 2018-06-24 LAB — BRAIN NATRIURETIC PEPTIDE: B NATRIURETIC PEPTIDE 5: 1025.7 pg/mL — AB (ref 0.0–100.0)

## 2018-06-24 LAB — BASIC METABOLIC PANEL
Anion gap: 9 (ref 5–15)
BUN: 40 mg/dL — ABNORMAL HIGH (ref 8–23)
CO2: 20 mmol/L — ABNORMAL LOW (ref 22–32)
Calcium: 8.9 mg/dL (ref 8.9–10.3)
Chloride: 107 mmol/L (ref 98–111)
Creatinine, Ser: 1.44 mg/dL — ABNORMAL HIGH (ref 0.61–1.24)
GFR calc Af Amer: 49 mL/min — ABNORMAL LOW (ref >60)
GFR calc non Af Amer: 42 mL/min — ABNORMAL LOW (ref >60)
Glucose, Bld: 84 mg/dL (ref 70–99)
Potassium: 4.5 mmol/L (ref 3.5–5.1)
Sodium: 136 mmol/L (ref 135–145)

## 2018-06-24 LAB — CBC WITH DIFFERENTIAL/PLATELET
Abs Immature Granulocytes: 0.06 10*3/uL (ref 0.00–0.07)
BASOS PCT: 1 %
Basophils Absolute: 0 10*3/uL (ref 0.0–0.1)
EOS ABS: 0.1 10*3/uL (ref 0.0–0.5)
Eosinophils Relative: 1 %
HCT: 35 % — ABNORMAL LOW (ref 39.0–52.0)
Hemoglobin: 10.7 g/dL — ABNORMAL LOW (ref 13.0–17.0)
Immature Granulocytes: 1 %
Lymphocytes Relative: 15 %
Lymphs Abs: 1.3 10*3/uL (ref 0.7–4.0)
MCH: 30.4 pg (ref 26.0–34.0)
MCHC: 30.6 g/dL (ref 30.0–36.0)
MCV: 99.4 fL (ref 80.0–100.0)
Monocytes Absolute: 0.8 10*3/uL (ref 0.1–1.0)
Monocytes Relative: 9 %
Neutro Abs: 6.5 10*3/uL (ref 1.7–7.7)
Neutrophils Relative %: 73 %
PLATELETS: 271 10*3/uL (ref 150–400)
RBC: 3.52 MIL/uL — ABNORMAL LOW (ref 4.22–5.81)
RDW: 15 % (ref 11.5–15.5)
WBC: 8.6 10*3/uL (ref 4.0–10.5)
nRBC: 0.5 % — ABNORMAL HIGH (ref 0.0–0.2)

## 2018-06-24 LAB — TROPONIN I: Troponin I: <0.03 ng/mL (ref <0.03)

## 2018-06-24 LAB — PROTIME-INR
INR: 3.5
Prothrombin Time: 34.9 seconds — ABNORMAL HIGH (ref 11.4–15.2)

## 2018-06-24 MED ORDER — AMIODARONE HCL 200 MG PO TABS
200.0000 mg | ORAL_TABLET | Freq: Every day | ORAL | Status: DC
  Administered 2018-06-25: 200 mg via ORAL
  Filled 2018-06-24: qty 1

## 2018-06-24 MED ORDER — WARFARIN - PHARMACIST DOSING INPATIENT: Freq: Every day | Status: DC

## 2018-06-24 MED ORDER — FUROSEMIDE 10 MG/ML IJ SOLN
20.0000 mg | Freq: Once | INTRAMUSCULAR | Status: AC
  Administered 2018-06-24: 20 mg via INTRAVENOUS
  Filled 2018-06-24: qty 2

## 2018-06-24 MED ORDER — TAMSULOSIN HCL 0.4 MG PO CAPS
0.4000 mg | ORAL_CAPSULE | Freq: Every day | ORAL | Status: DC
  Administered 2018-06-24: 0.4 mg via ORAL
  Filled 2018-06-24: qty 1

## 2018-06-24 MED ORDER — SODIUM CHLORIDE 0.9% FLUSH: 3.0000 mL | INTRAVENOUS | Status: DC | PRN

## 2018-06-24 MED ORDER — SODIUM CHLORIDE 0.9% FLUSH
3.0000 mL | Freq: Two times a day (BID) | INTRAVENOUS | Status: DC
  Administered 2018-06-24 – 2018-06-25 (×2): 3 mL via INTRAVENOUS

## 2018-06-24 MED ORDER — SODIUM CHLORIDE 0.9 % IV SOLN: 250.0000 mL | INTRAVENOUS | Status: DC | PRN

## 2018-06-24 MED ORDER — FUROSEMIDE 10 MG/ML IJ SOLN
20.0000 mg | Freq: Two times a day (BID) | INTRAMUSCULAR | Status: DC
  Administered 2018-06-25: 20 mg via INTRAVENOUS
  Filled 2018-06-24: qty 2

## 2018-06-24 MED ORDER — ACETAMINOPHEN 325 MG PO TABS: 650.0000 mg | ORAL_TABLET | ORAL | Status: DC | PRN

## 2018-06-24 MED ORDER — HYDROCODONE-ACETAMINOPHEN 5-325 MG PO TABS: 1.0000 | ORAL_TABLET | ORAL | Status: DC | PRN

## 2018-06-24 MED ORDER — LEVOTHYROXINE SODIUM 50 MCG PO TABS
50.0000 ug | ORAL_TABLET | Freq: Every day | ORAL | Status: DC
  Administered 2018-06-25: 50 ug via ORAL
  Filled 2018-06-24: qty 1

## 2018-06-24 MED ORDER — ONDANSETRON HCL 4 MG/2ML IJ SOLN: 4.0000 mg | Freq: Four times a day (QID) | INTRAMUSCULAR | Status: DC | PRN

## 2018-06-24 MED ORDER — ATORVASTATIN CALCIUM 10 MG PO TABS
10.0000 mg | ORAL_TABLET | Freq: Every day | ORAL | Status: DC
  Administered 2018-06-24: 10 mg via ORAL
  Filled 2018-06-24: qty 1

## 2018-06-24 MED ORDER — LORAZEPAM 1 MG PO TABS: 0.5000 mg | ORAL_TABLET | Freq: Every day | ORAL | Status: DC | PRN

## 2018-06-24 MED ORDER — METOPROLOL SUCCINATE ER 25 MG PO TB24
50.0000 mg | ORAL_TABLET | Freq: Two times a day (BID) | ORAL | Status: DC
  Administered 2018-06-24 – 2018-06-25 (×2): 50 mg via ORAL
  Filled 2018-06-24 (×2): qty 2

## 2018-06-24 MED ORDER — MAGNESIUM OXIDE 400 (241.3 MG) MG PO TABS
400.0000 mg | ORAL_TABLET | Freq: Two times a day (BID) | ORAL | Status: DC
  Administered 2018-06-24 – 2018-06-25 (×2): 400 mg via ORAL
  Filled 2018-06-24 (×2): qty 1

## 2018-06-24 MED ORDER — ASPIRIN EC 81 MG PO TBEC
81.0000 mg | DELAYED_RELEASE_TABLET | Freq: Every day | ORAL | Status: DC
  Administered 2018-06-25: 81 mg via ORAL
  Filled 2018-06-24: qty 1

## 2018-06-24 NOTE — ED Notes (Signed)
RN informed it's ok for visitor to come back

## 2018-06-24 NOTE — H&P (Signed)
History and Physical    Christopher Butler AST:[id number] DOB: 1924-10-10 DOA: 06/24/2018  PCP: Lajean Manes, MD   Patient coming from: Home   Chief Complaint: Gen weakness, fatigue, SOB, fall   HPI: Christopher Butler is a 85 y.o. male with medical history significant for chronic systolic CHF (EF 20 to 98%), atrial fibrillation on warfarin, chronic kidney disease stage III, hypertension, and coronary artery disease, now presenting to the emergency department for evaluation of generalized weakness, fatigue, shortness of breath, and a fall.  Patient reports insidious development of shortness of breath and generalized weakness and fatigue over the past several days.  He denies any significant cough, denies fevers or chills, and denies chest pain associated with this.  There is no focal numbness or weakness.  He was particularly weak and short of breath today, felt as though his legs cannot support him, and fell, bumping his head on a piece of furniture without loss of consciousness.  He denies any significant headache after this, denies any change in vision or hearing, and denies any focal numbness or weakness.  ED Course: Upon arrival to the ED, patient is found to be afebrile, saturating 88% on room air, slightly tachypneic, stable blood pressure.  EKG features atrial fibrillation with chronic LBBB.  Chest x-ray is notable for diffuse interstitial opacities suspicious for mild edema as well as more focal opacity at the right base which could reflect a pneumonia.  Chemistry panel is notable for creatinine 1.44, similar to priors.  CBC features a stable chronic normocytic anemia.  INR is 3.5, troponin undetectable, and BNP is elevated to 1026.  Was given 20 mg IV Lasix in the ED.  He remains hemodynamically stable and will be observed for further evaluation and management.  Review of Systems:  All other systems reviewed and apart from HPI, are negative.  Past Medical History:  Diagnosis Date  . Acute  myocardial infarction 1987   s/p TPA  . Anxiety   . Arthritis    "probably in my back" (04/15/2018)  . CAD (coronary artery disease)    a. remote inferior wall myocardial infarction (status post streptokinase in 1987-did not require angioplasty).  . Cholelithiases   . Chronic lower back pain   . Chronic systolic CHF (congestive heart failure) ([place])   . CKD (chronic kidney disease), stage II   . COPD (chronic obstructive pulmonary disease) ([place])   . Disc disease, degenerative, thoracic or thoracolumbar   . ED (erectile dysfunction)   . Emphysema   . Headache    "usually 2/wk" (04/15/2018)  . HTN (hypertension)   . Hypercholesteremia   . Hypertensive heart disease with chronic systolic congestive heart failure ([place])   . Hyponatremia Sept 2010  . Hypothyroidism   . Permanent atrial fibrillation   . Sleep apnea    pt denies this hx on 04/15/2018  . Tobacco abuse   . Tricuspid regurgitation     Past Surgical History:  Procedure Laterality Date  . APPENDECTOMY    . CARDIAC CATHETERIZATION  1987  . CATARACT EXTRACTION, BILATERAL Bilateral ~ 2018  . TONSILLECTOMY       reports that he has been smoking. He has a 35.00 pack-year smoking history. He has never used smokeless tobacco. He reports current alcohol use. He reports that he does not use drugs.  Allergies  Allergen Reactions  . Ceclor [Cefaclor]   . Erythromycin     Family History  Family history unknown: Yes     Prior to  Admission medications   Medication Sig Start Date End Date Taking? Authorizing Provider  amiodarone (PACERONE) 200 MG tablet Take 1 tablet (200 mg total) by mouth daily. 04/18/18   Nita Sells, MD  aspirin EC 81 MG EC tablet Take 1 tablet (81 mg total) by mouth daily. 04/06/18   Lyda Jester M, PA-C  atorvastatin (LIPITOR) 10 MG tablet Take 1 tablet (10 mg total) by mouth daily. 06/26/17   Dunn, Nedra Hai, PA-C  furosemide (LASIX) 20 MG tablet Take 1 tablet (20 mg total) by mouth daily.  04/18/18   Nita Sells, MD  levothyroxine (SYNTHROID, LEVOTHROID) 50 MCG tablet Take 50 mcg by mouth daily before breakfast.    [provider]  LORazepam (ATIVAN) 0.5 MG tablet Take 0.5 mg by mouth daily as needed for anxiety.    [provider]  magnesium oxide (MAG-OX) 400 (241.3 Mg) MG tablet Take 1 tablet (400 mg total) by mouth 2 (two) times daily. 04/18/18   Nita Sells, MD  metoprolol succinate (TOPROL-XL) 50 MG 24 hr tablet Take 1 tablet (50 mg total) by mouth 2 (two) times daily with a meal. Take with or immediately following a meal. 04/05/18   Rosita Fire, Brittainy M, PA-C  nicotine (NICODERM CQ - DOSED IN MG/24 HOURS) 14 mg/24hr patch Place 1 patch (14 mg total) onto the skin daily. 04/18/18   Nita Sells, MD  nitroGLYCERIN (NITROSTAT) 0.4 MG SL tablet Place 1 tablet (0.4 mg total) under the tongue every 5 (five) minutes as needed for chest pain. 04/05/18   Lyda Jester M, PA-C  potassium chloride SA (K-DUR,KLOR-CON) 20 MEQ tablet Take 2 tablets (40 mEq total) by mouth daily. 04/18/18   Nita Sells, MD  tamsulosin (FLOMAX) 0.4 MG CAPS capsule Take 0.4 mg by mouth at bedtime.    [provider]  traMADol (ULTRAM) 50 MG tablet Take 1 tablet (50 mg total) by mouth 2 (two) times daily as needed (for pain.). ADDITIONAL REFILLS FROM PRIMARY CARE PHYSICIAN Patient taking differently: Take 50 mg by mouth 2 (two) times daily as needed (for pain.).  06/04/15   Darlin Coco, MD  warfarin (COUMADIN) 2.5 MG tablet Take 1 tablet (2.5 mg total) by mouth one time only at 6 PM. 04/18/18   Nita Sells, MD    Physical Exam: Vitals:   06/24/18 1641 06/24/18 1715 06/24/18 1730 06/24/18 1800  BP:  120/87 121/89 119/83  Pulse:  64 (!) 47 70  Resp:  20 18 19   Temp: (!) 97.5 F (36.4 C)     TempSrc: Oral     SpO2:  91% 91% 92%  Weight:      Height:        Constitutional: NAD, calm  Eyes: PERTLA, lids and conjunctivae  normal ENMT: Mucous membranes are moist. Posterior pharynx clear of any exudate or lesions.   Neck: normal, supple, no masses, no thyromegaly Respiratory: Fine rales bilaterally. Mild dyspnea with speech. No accessory muscle use.  Cardiovascular: Rate ~60 and irregular. Trace pretibial pitting edema bilaterally.   Abdomen: No distension, no tenderness, soft. Bowel sounds normal.  Musculoskeletal: no clubbing / cyanosis. No joint deformity upper and lower extremities.    Skin: no significant rashes, lesions, ulcers. Warm, dry, well-perfused. Neurologic: No gross facial asymmetry. Sensation intact. Moving all extremities.  Psychiatric: Alert and oriented to person, place, and situation. Calm, cooperative.    Labs on Admission: I have personally reviewed following labs and imaging studies  CBC: Recent Labs  Lab 06/24/18 1702  WBC  8.6  NEUTROABS 6.5  HGB 10.7*  HCT 35.0*  MCV 99.4  PLT 284   Basic Metabolic Panel: Recent Labs  Lab 06/24/18 1702  NA 136  K 4.5  CL 107  CO2 20*  GLUCOSE 84  BUN 40*  CREATININE 1.44*  CALCIUM 8.9   GFR: Estimated Creatinine Clearance: 29.5 mL/min (A) (by C-G formula based on SCr of 1.44 mg/dL (H)). Liver Function Tests: No results for input(s): AST, ALT, ALKPHOS, BILITOT, PROT, ALBUMIN in the last 168 hours. No results for input(s): LIPASE, AMYLASE in the last 168 hours. No results for input(s): AMMONIA in the last 168 hours. Coagulation Profile: Recent Labs  Lab 06/24/18 1702  INR 3.5   Cardiac Enzymes: Recent Labs  Lab 06/24/18 1702  TROPONINI <0.03   BNP (last 3 results) No results for input(s): PROBNP in the last 8760 hours. HbA1C: No results for input(s): HGBA1C in the last 72 hours. CBG: No results for input(s): GLUCAP in the last 168 hours. Lipid Profile: No results for input(s): CHOL, HDL, LDLCALC, TRIG, CHOLHDL, LDLDIRECT in the last 72 hours. Thyroid Function Tests: No results for input(s): TSH, T4TOTAL, FREET4,  T3FREE, THYROIDAB in the last 72 hours. Anemia Panel: No results for input(s): VITAMINB12, FOLATE, FERRITIN, TIBC, IRON, RETICCTPCT in the last 72 hours. Urine analysis:    Component Value Date/Time   COLORURINE YELLOW 06/24/2018 1702   APPEARANCEUR CLEAR 06/24/2018 1702   LABSPEC 1.018 06/24/2018 1702   PHURINE 6.0 06/24/2018 1702   GLUCOSEU NEGATIVE 06/24/2018 1702   HGBUR SMALL (A) 06/24/2018 1702   BILIRUBINUR NEGATIVE 06/24/2018 1702   KETONESUR NEGATIVE 06/24/2018 1702   PROTEINUR NEGATIVE 06/24/2018 1702   UROBILINOGEN 0.2 01/23/2009 2352   NITRITE NEGATIVE 06/24/2018 1702   LEUKOCYTESUR SMALL (A) 06/24/2018 1702   Sepsis Labs: @LABRCNTIP (procalcitonin:4,lacticidven:4) )No results found for this or any previous visit (from the past 240 hour(s)).   Radiological Exams on Admission: Dg Chest 2 View  Result Date: 06/24/2018 CLINICAL DATA:  Shortness of breath. EXAM: CHEST - 2 VIEW COMPARISON:  April 15, 2018 FINDINGS: Markedly limited study due to patient rotation. No pneumothorax. Cardiomediastinal silhouette is poorly evaluated but grossly unremarkable with a tortuous thoracic aorta and cardiomegaly. Diffuse interstitial opacities are identified. More focal opacity in the right base is seen. No other interval changes. IMPRESSION: 1. Markedly limited study due to patient rotation. 2. Diffuse interstitial opacities suggest mild edema. 3. More focal opacity in the right base may represent pneumonia. Recommend clinical correlation and follow-up to resolution. Electronically Signed   By: Dorise Bullion III M.D   On: 06/24/2018 17:24   Ct Head Wo Contrast  Result Date: 06/24/2018 CLINICAL DATA:  Fall today with injury to back of head. EXAM: CT HEAD WITHOUT CONTRAST TECHNIQUE: Contiguous axial images were obtained from the base of the skull through the vertex without intravenous contrast. COMPARISON:  01/24/2009 and MRI 01/24/2009. FINDINGS: Brain: Ventricles and cisterns are within  normal. There is mild age related atrophic change. There is mild chronic ischemic microvascular disease. Tiny lacunar infarct over the left internal capsule. There is no mass, mass effect, shift of midline structures or acute hemorrhage. No evidence of acute infarction. Vascular: No hyperdense vessel or unexpected calcification. Skull: Normal. Negative for fracture or focal lesion. Sinuses/Orbits: No acute finding. Other: None. IMPRESSION: No acute findings. Chronic ischemic microvascular disease and age related atrophic change. Electronically Signed   By: Marin Olp M.D.   On: 06/24/2018 17:33    EKG: Independently reviewed. Atrial  fibrillation, chronic LBBB.   Assessment/Plan   1. Acute on chronic systolic CHF  - Presents with SOB, gen weakness, and fatigue, and is found to be in acute CHF  - EF 20-25% in December 2019 moderate MR, mild TR, and massive LAE  - He was treated with Lasix 20 mg IV in ED  - Continue diuresis with Lasix 20 mg IV q12h and increase as needed, continue beta-blocker as tolerated, follow daily wt and I/O's    2. Atrial fibrillation  - In rate-controlled a fib on admission  - CHADS-VASc is 40 (age x2, CHF, HTN, CAD)  - Continue warfarin, amiodarone, and metoprolol   3. CAD  - No anginal complaints  - Continue ASA, statin, and beta-blocker   4. Hypertension  - BP at goal  - Continue Toprol as tolerated   5. CKD III  - SCr is 1.44 on admission, similar to priors  - Renally-dose mediations and follow daily chem panel during IV diuresis     DVT prophylaxis: warfarin   Code Status: DNR  Family Communication: Discussed with patient  Consults called: None Admission status: Observation     Vianne Bulls, MD [facility] Pager [phone]  If 7PM-7AM, please contact night-coverage [email or web address] Password [facility]  06/24/2018, 7:45 PM

## 2018-06-24 NOTE — ED Notes (Signed)
Returned from xray

## 2018-06-24 NOTE — ED Notes (Signed)
Steffanie Dunn, MD advised to hold all po meds for now.

## 2018-06-24 NOTE — ED Provider Notes (Signed)
[facility] EMERGENCY DEPARTMENT Provider Note   CSN: [id number] Arrival date & time: 06/24/18  1539    History   Chief Complaint Chief Complaint  Patient presents with  . Fall    HPI Christopher Butler is a 85 y.o. male.     HPI  85 year old male presents after a fall.  He was feeling a little weak when he first woke up.  He was walking outside to smoke with his rolling walker.  On the way back he felt like his legs were becoming progressively weaker and he could not support himself, even with the walker.  He fell and hit the back of his head on a TV stand.  However he did not lose consciousness and denies any serious headache or head injury.  He was able to crawl and pull himself up in a chair.  He does endorse some progressively worsening dyspnea and PND over the last couple weeks.  Some chronic cough and congestion.  No chest pain.  Both of his legs have felt weak.  No dizziness or lightheadedness today. Denies feeling ill recently.   Past Medical History:  Diagnosis Date  . Acute myocardial infarction 1987   s/p TPA  . Anxiety   . Arthritis    "probably in my back" (04/15/2018)  . CAD (coronary artery disease)    a. remote inferior wall myocardial infarction (status post streptokinase in 1987-did not require angioplasty).  . Cholelithiases   . Chronic lower back pain   . Chronic systolic CHF (congestive heart failure) ([place])   . CKD (chronic kidney disease), stage II   . COPD (chronic obstructive pulmonary disease) ([place])   . Disc disease, degenerative, thoracic or thoracolumbar   . ED (erectile dysfunction)   . Emphysema   . Headache    "usually 2/wk" (04/15/2018)  . HTN (hypertension)   . Hypercholesteremia   . Hypertensive heart disease with chronic systolic congestive heart failure ([place])   . Hyponatremia Sept 2010  . Hypothyroidism   . Permanent atrial fibrillation   . Sleep apnea    pt denies this hx on 04/15/2018  . Tobacco abuse   .  Tricuspid regurgitation     Patient Active Problem List   Diagnosis Date Noted  . CKD (chronic kidney disease), stage III ([place]) 06/24/2018  . Elevated INR 04/16/2018  . Acute kidney injury ([place]) 04/16/2018  . Goals of care, counseling/discussion   . Palliative care by specialist   . Acute on chronic systolic CHF (congestive heart failure) ([place]) 04/15/2018  . Essential hypertension 04/15/2018  . Acquired hypothyroidism 04/15/2018  . Ventricular tachycardia ([place]) 03/28/2018  . Chronic systolic CHF (congestive heart failure) ([place]) 09/20/2016  . Coronary artery disease involving native coronary artery of native heart without angina pectoris 03/27/2016  . Herpes zoster 12/17/2013  . Encounter for therapeutic drug monitoring 05/30/2013  . Tinnitus 12/20/2012  . Hyposmolality and/or hyponatremia 12/20/2012  . Tinea corporis 04/17/2012  . Permanent atrial fibrillation 10/24/2011  . Atrial fibrillation, chronic 08/24/2011  . Ischemic heart disease 08/03/2010  . Tobacco abuse 08/03/2010  . Hypertensive heart disease with CHF (congestive heart failure) ([place]) 08/03/2010  . Hypercholesterolemia 08/03/2010  . Erectile dysfunction 08/03/2010  . Low back pain 08/03/2010  . Cholelithiasis 08/03/2010    Past Surgical History:  Procedure Laterality Date  . APPENDECTOMY    . CARDIAC CATHETERIZATION  1987  . CATARACT EXTRACTION, BILATERAL Bilateral ~ 2018  . TONSILLECTOMY  Home Medications    Prior to Admission medications   Medication Sig Start Date End Date Taking? Authorizing Provider  amiodarone (PACERONE) 200 MG tablet Take 1 tablet (200 mg total) by mouth daily. 04/18/18  Yes Nita Sells, MD  aspirin EC 81 MG EC tablet Take 1 tablet (81 mg total) by mouth daily. 04/06/18  Yes Lyda Jester M, PA-C  atorvastatin (LIPITOR) 10 MG tablet Take 1 tablet (10 mg total) by mouth daily. 06/26/17  Yes Dunn, Dayna N, PA-C  furosemide (LASIX) 20 MG tablet Take 1 tablet (20  mg total) by mouth daily. 04/18/18  Yes Nita Sells, MD  levothyroxine (SYNTHROID, LEVOTHROID) 50 MCG tablet Take 50 mcg by mouth daily before breakfast.   Yes [provider]  metoprolol succinate (TOPROL-XL) 50 MG 24 hr tablet Take 1 tablet (50 mg total) by mouth 2 (two) times daily with a meal. Take with or immediately following a meal. 04/05/18  Yes Lyda Jester M, PA-C  nitroGLYCERIN (NITROSTAT) 0.4 MG SL tablet Place 1 tablet (0.4 mg total) under the tongue every 5 (five) minutes as needed for chest pain. 04/05/18  Yes Lyda Jester M, PA-C  potassium chloride SA (K-DUR,KLOR-CON) 20 MEQ tablet Take 2 tablets (40 mEq total) by mouth daily. 04/18/18  Yes Nita Sells, MD  tamsulosin (FLOMAX) 0.4 MG CAPS capsule Take 0.4 mg by mouth at bedtime.   Yes [provider]  traMADol (ULTRAM) 50 MG tablet Take 1 tablet (50 mg total) by mouth 2 (two) times daily as needed (for pain.). ADDITIONAL REFILLS FROM PRIMARY CARE PHYSICIAN Patient taking differently: Take 50 mg by mouth daily as needed (for pain.).  06/04/15  Yes Darlin Coco, MD  warfarin (COUMADIN) 2.5 MG tablet Take 1 tablet (2.5 mg total) by mouth one time only at 6 PM. Patient taking differently: Take 1.25 mg by mouth daily.  04/18/18  Yes Nita Sells, MD  magnesium oxide (MAG-OX) 400 (241.3 Mg) MG tablet Take 1 tablet (400 mg total) by mouth 2 (two) times daily. 04/18/18   Nita Sells, MD  nicotine (NICODERM CQ - DOSED IN MG/24 HOURS) 14 mg/24hr patch Place 1 patch (14 mg total) onto the skin daily. Patient not taking: Reported on 06/24/2018 04/18/18   Nita Sells, MD    Family History Family History  Family history unknown: Yes    Social History Social History   Tobacco Use  . Smoking status: Current Every Day Smoker    Packs/day: 0.50    Years: 70.00    Pack years: 35.00  . Smokeless tobacco: Never Used  Substance Use Topics  . Alcohol use: Yes     Comment: 04/15/2018 couple of drinks most nights prior to Thanksgiving 2019"; nothing since  . Drug use: No     Allergies   Ceclor [cefaclor] and Erythromycin   Review of Systems Review of Systems  Constitutional: Negative for fever.  HENT: Positive for congestion.   Respiratory: Positive for cough and shortness of breath.   Cardiovascular: Negative for chest pain.  Gastrointestinal: Negative for diarrhea and vomiting.  Musculoskeletal: Positive for back pain (chronic).  Neurological: Positive for weakness. Negative for dizziness, syncope, light-headedness and headaches.  All other systems reviewed and are negative.    Physical Exam Updated Vital Signs BP 126/84 (BP Location: Right Arm)   Pulse 67   Temp 98 F (36.7 C) (Oral)   Resp 16   Ht 5\' 2"  (1.575 m)   Wt 64.3 kg   SpO2 93%   BMI  25.94 kg/m   Physical Exam Vitals signs and nursing note reviewed.  Constitutional:      General: He is not in acute distress.    Appearance: He is well-developed. He is not ill-appearing or diaphoretic.  HENT:     Head: Normocephalic and atraumatic.     Right Ear: External ear normal.     Left Ear: External ear normal.     Nose: Nose normal.  Eyes:     General:        Right eye: No discharge.        Left eye: No discharge.  Neck:     Musculoskeletal: Neck supple.  Cardiovascular:     Rate and Rhythm: Normal rate and regular rhythm.     Heart sounds: Normal heart sounds.  Pulmonary:     Effort: Pulmonary effort is normal.     Comments: Asymmetric mild wheezing to the right side Abdominal:     Palpations: Abdomen is soft.     Tenderness: There is no abdominal tenderness.  Musculoskeletal:     Right lower leg: Edema present.     Left lower leg: Edema present.     Comments: Pitting edema to BLE, R>L (chronically this way per patient)  Skin:    General: Skin is warm and dry.  Neurological:     Mental Status: He is alert.     Comments: CN 3-12 grossly intact. 5/5 strength  in all 4 extremities. Grossly normal sensation. Normal finger to nose.   Psychiatric:        Mood and Affect: Mood is not anxious.      ED Treatments / Results  Labs (all labs ordered are listed, but only abnormal results are displayed) Labs Reviewed  BASIC METABOLIC PANEL - Abnormal; Notable for the following components:      Result Value   CO2 20 (*)    BUN 40 (*)    Creatinine, Ser 1.44 (*)    GFR calc non Af Amer 42 (*)    GFR calc Af Amer 49 (*)    All other components within normal limits  BRAIN NATRIURETIC PEPTIDE - Abnormal; Notable for the following components:   B Natriuretic Peptide 1,025.7 (*)    All other components within normal limits  CBC WITH DIFFERENTIAL/PLATELET - Abnormal; Notable for the following components:   RBC 3.52 (*)    Hemoglobin 10.7 (*)    HCT 35.0 (*)    nRBC 0.5 (*)    All other components within normal limits  PROTIME-INR - Abnormal; Notable for the following components:   Prothrombin Time 34.9 (*)    All other components within normal limits  URINALYSIS, ROUTINE W REFLEX MICROSCOPIC - Abnormal; Notable for the following components:   Hgb urine dipstick SMALL (*)    Leukocytes,Ua SMALL (*)    Bacteria, UA RARE (*)    All other components within normal limits  TROPONIN I  BASIC METABOLIC PANEL  PROTIME-INR    EKG EKG Interpretation  Date/Time:  Monday June 24 2018 16:38:32 EST Ventricular Rate:  75 PR Interval:    QRS Duration: 155 QT Interval:  489 QTC Calculation: 547 R Axis:   -68 Text Interpretation:  Atrial fibrillation Left bundle branch block Confirmed by Sherwood Gambler [phone]) on 06/24/2018 4:42:55 PM   Radiology Dg Chest 2 View  Result Date: 06/24/2018 CLINICAL DATA:  Shortness of breath. EXAM: CHEST - 2 VIEW COMPARISON:  April 15, 2018 FINDINGS: Markedly limited study due to patient rotation. No  pneumothorax. Cardiomediastinal silhouette is poorly evaluated but grossly unremarkable with a tortuous thoracic  aorta and cardiomegaly. Diffuse interstitial opacities are identified. More focal opacity in the right base is seen. No other interval changes. IMPRESSION: 1. Markedly limited study due to patient rotation. 2. Diffuse interstitial opacities suggest mild edema. 3. More focal opacity in the right base may represent pneumonia. Recommend clinical correlation and follow-up to resolution. Electronically Signed   By: Dorise Bullion III M.D   On: 06/24/2018 17:24   Ct Head Wo Contrast  Result Date: 06/24/2018 CLINICAL DATA:  Fall today with injury to back of head. EXAM: CT HEAD WITHOUT CONTRAST TECHNIQUE: Contiguous axial images were obtained from the base of the skull through the vertex without intravenous contrast. COMPARISON:  01/24/2009 and MRI 01/24/2009. FINDINGS: Brain: Ventricles and cisterns are within normal. There is mild age related atrophic change. There is mild chronic ischemic microvascular disease. Tiny lacunar infarct over the left internal capsule. There is no mass, mass effect, shift of midline structures or acute hemorrhage. No evidence of acute infarction. Vascular: No hyperdense vessel or unexpected calcification. Skull: Normal. Negative for fracture or focal lesion. Sinuses/Orbits: No acute finding. Other: None. IMPRESSION: No acute findings. Chronic ischemic microvascular disease and age related atrophic change. Electronically Signed   By: Marin Olp M.D.   On: 06/24/2018 17:33    Procedures Procedures (including critical care time)  Medications Ordered in ED Medications  aspirin EC tablet 81 mg (has no administration in time range)  amiodarone (PACERONE) tablet 200 mg (has no administration in time range)  atorvastatin (LIPITOR) tablet 10 mg (10 mg Oral Given 06/24/18 2235)  metoprolol succinate (TOPROL-XL) 24 hr tablet 50 mg (50 mg Oral Given 06/24/18 2234)  LORazepam (ATIVAN) tablet 0.5 mg (has no administration in time range)  levothyroxine (SYNTHROID, LEVOTHROID) tablet 50 mcg  (has no administration in time range)  tamsulosin (FLOMAX) capsule 0.4 mg (0.4 mg Oral Given 06/24/18 2234)  magnesium oxide (MAG-OX) tablet 400 mg (400 mg Oral Given 06/24/18 2235)  sodium chloride flush (NS) 0.9 % injection 3 mL (3 mLs Intravenous Given 06/24/18 2235)  sodium chloride flush (NS) 0.9 % injection 3 mL (has no administration in time range)  0.9 %  sodium chloride infusion (has no administration in time range)  acetaminophen (TYLENOL) tablet 650 mg (has no administration in time range)  ondansetron (ZOFRAN) injection 4 mg (has no administration in time range)  furosemide (LASIX) injection 20 mg (has no administration in time range)  HYDROcodone-acetaminophen (NORCO/VICODIN) 5-325 MG per tablet 1 tablet (has no administration in time range)  Warfarin - Pharmacist Dosing Inpatient (has no administration in time range)  furosemide (LASIX) injection 20 mg (20 mg Intravenous Given 06/24/18 1949)     Initial Impression / Assessment and Plan / ED Course  I have reviewed the triage vital signs and the nursing notes.  Pertinent labs & imaging results that were available during my care of the patient were reviewed by me and considered in my medical decision making (see chart for details).        I think a lot of the patient's weakness in his lower extremities is related to acute congestive heart failure exacerbation.  Labs and x-ray are consistent with this.  Given head injury while on warfarin, CT head obtained but is negative.  He will be diuresed and will need admission.  Dr. Myna Hidalgo to admit.  Final Clinical Impressions(s) / ED Diagnoses   Final diagnoses:  Acute on chronic systolic  congestive heart failure [facility])    ED Discharge Orders    None       Sherwood Gambler, MD 06/24/18 2324

## 2018-06-24 NOTE — Progress Notes (Signed)
   06/24/18 2056  Vitals  Temp 98 F (36.7 C)  Temp Source Oral  BP 126/84  MAP (mmHg) 97  BP Location Right Arm  BP Method Automatic  Patient Position (if appropriate) Lying  Pulse Rate 67  Resp 16  Oxygen Therapy  SpO2 93 %  O2 Device Room Air  Height and Weight  Height 5\' 2"  (1.575 m)  Weight 64.3 kg  Type of Scale Used Standing (Scale C)  Type of Weight Stated  BSA (Calculated - sq m) 1.68 sq meters  BMI (Calculated) 25.93  Weight in (lb) to have BMI = 25 136.4  MEWS Score  MEWS RR 0  MEWS Pulse 0  MEWS Systolic 0  MEWS LOC 0  MEWS Temp 0  MEWS Score 0  MEWS Score Color Green  Admitted pt to rm 3E15 from ED, pt alert and oriented, denied pain at this time, oriented to room, call bell placed within reach, placed on cardiac monitor, CCMD made aware. Pt placed on low bed, educated on safety precautions.

## 2018-06-24 NOTE — ED Notes (Signed)
Patient transported to CT 

## 2018-06-24 NOTE — Progress Notes (Signed)
ANTICOAGULATION CONSULT NOTE - Initial Consult  Pharmacy Consult for Coumadin Indication: atrial fibrillation  Allergies  Allergen Reactions  . Ceclor [Cefaclor]   . Erythromycin     Patient Measurements: Height: 5\' 6"  (167.6 cm) Weight: 141 lb (64 kg) IBW/kg (Calculated) : 63.8  Vital Signs: Temp: 97.5 F (36.4 C) (02/24 1641) Temp Source: Oral (02/24 1641) BP: 119/83 (02/24 1800) Pulse Rate: 70 (02/24 1800)  Labs: Recent Labs    06/24/18 1702  HGB 10.7*  HCT 35.0*  PLT 271  LABPROT 34.9*  INR 3.5  CREATININE 1.44*  TROPONINI <0.03    Estimated Creatinine Clearance: 29.5 mL/min (A) (by C-G formula based on SCr of 1.44 mg/dL (H)).   Medical History: Past Medical History:  Diagnosis Date  . Acute myocardial infarction 1987   s/p TPA  . Anxiety   . Arthritis    "probably in my back" (04/15/2018)  . CAD (coronary artery disease)    a. remote inferior wall myocardial infarction (status post streptokinase in 1987-did not require angioplasty).  . Cholelithiases   . Chronic lower back pain   . Chronic systolic CHF (congestive heart failure) ([place])   . CKD (chronic kidney disease), stage II   . COPD (chronic obstructive pulmonary disease) ([place])   . Disc disease, degenerative, thoracic or thoracolumbar   . ED (erectile dysfunction)   . Emphysema   . Headache    "usually 2/wk" (04/15/2018)  . HTN (hypertension)   . Hypercholesteremia   . Hypertensive heart disease with chronic systolic congestive heart failure ([place])   . Hyponatremia Sept 2010  . Hypothyroidism   . Permanent atrial fibrillation   . Sleep apnea    pt denies this hx on 04/15/2018  . Tobacco abuse   . Tricuspid regurgitation     Assessment: 85 yo M presents after fall. On Coumadin 2.5mg  PO daily PTA for Afib. Recently had dose decreased to this new dose with elevated INR on last anticoag clinic visit on 2/11. INR elevated at 3.5 today. Hgb 10.7, plts wnl.  Goal of Therapy:  INR 2-3 Monitor  platelets by anticoagulation protocol: Yes   Plan:  Hold Coumadin tonight Monitor daily INR, CBC, s/s of bleed   Derreon Consalvo J 06/24/2018,7:50 PM

## 2018-06-24 NOTE — ED Notes (Signed)
Pt ambulated to bathroom with assistance of walker/tech. Pt stated his legs felt tired but he did not feel sob or dizzy. Pt oxygen bumped between 92-98. Pt back in bed and transported to xray.

## 2018-06-24 NOTE — ED Triage Notes (Signed)
Pt was feeling weak in his legs before he went out to smoke, as he was walking inside his legs felt like they just wouldn't hold him up & he fell. He said that he remembers hitting his head on the tv stand but denies it even hurting & denies LOC.

## 2018-06-25 DIAGNOSIS — I5023 Acute on chronic systolic (congestive) heart failure: Secondary | ICD-10-CM

## 2018-06-25 LAB — BASIC METABOLIC PANEL
ANION GAP: 7 (ref 5–15)
BUN: 38 mg/dL — ABNORMAL HIGH (ref 8–23)
CO2: 23 mmol/L (ref 22–32)
Calcium: 8.3 mg/dL — ABNORMAL LOW (ref 8.9–10.3)
Chloride: 107 mmol/L (ref 98–111)
Creatinine, Ser: 1.4 mg/dL — ABNORMAL HIGH (ref 0.61–1.24)
GFR, EST AFRICAN AMERICAN: 50 mL/min — AB (ref >60)
GFR, EST NON AFRICAN AMERICAN: 43 mL/min — AB (ref >60)
Glucose, Bld: 66 mg/dL — ABNORMAL LOW (ref 70–99)
Potassium: 3.6 mmol/L (ref 3.5–5.1)
Sodium: 137 mmol/L (ref 135–145)

## 2018-06-25 LAB — PROTIME-INR
INR: 3.8 — ABNORMAL HIGH (ref 0.8–1.2)
Prothrombin Time: 37.1 seconds — ABNORMAL HIGH (ref 11.4–15.2)

## 2018-06-25 LAB — PROCALCITONIN: Procalcitonin: <0.1 ng/mL

## 2018-06-25 MED ORDER — SALINE SPRAY 0.65 % NA SOLN
1.0000 | NASAL | Status: DC | PRN
  Filled 2018-06-25: qty 44

## 2018-06-25 NOTE — Progress Notes (Signed)
Physical Therapy Evaluation Patient Details Name: Christopher Butler MRN: [id number] DOB: Feb 25, 1925 Today's Date: 06/25/2018   History of Present Illness  Patient is 85 y/o male presenting to hospital with generalized weakness, fatigue, SOB, and mechanical fall secondary to acute on chronic CHF. PMH includes COPD, afib, CAD, HTN, CKD, and emphysema. Patient is a current smoker.   Clinical Impression  Patient admitted to hospital secondary to problems above and with deficits below. Patient required min guard to stand and ambulate with use of RW. Patient initally experiencing orthostatic hypotension but BP stabilzing with prolonged standing (see vitals flowsheet). Patient with decreased safety awareness and ignoring safety cues throughout session. Given functional mobility deficits, high fall risk, and decreased caregiver assistance, recommending SNF level therapy. If patient refuses will need HHPT and OT and 24/7 supervision. Patient will benefit from acute physical therapy to maximize independence and safety with functional mobility.     Follow Up Recommendations SNF;Supervision/Assistance - 24 hour    Equipment Recommendations  None recommended by PT    Recommendations for Other Services       Precautions / Restrictions Precautions Precautions: Fall Precaution Comments: Patient reports 3 falls in last month Restrictions Weight Bearing Restrictions: No      Mobility  Bed Mobility Overal bed mobility: Needs Assistance Bed Mobility: Supine to Sit     Supine to sit: Min assist     General bed mobility comments: Patient required minA for bed mobility for trunk control. Reports feeling "foggy" upon siting EOB which subsided with time.   Transfers Overall transfer level: Needs assistance Equipment used: Rolling walker (2 wheeled) Transfers: Sit to/from Stand Sit to Stand: Min guard         General transfer comment: Patient required min guard to stand with use of RW for safety.  Verbal cues for hand placement prior to standing when using RW. Patient ignored cues and pulled up using RW.   Ambulation/Gait Ambulation/Gait assistance: Min guard Gait Distance (Feet): 100 Feet Assistive device: Rolling walker (2 wheeled) Gait Pattern/deviations: Step-through pattern;Decreased step length - right;Decreased step length - left;Decreased stride length;Trunk flexed Gait velocity: decreased Gait velocity interpretation: <1.8 ft/sec, indicate of risk for recurrent falls General Gait Details: Patient ambulated with min guard and use of RW for safety. Verbal cues for sequencing and proximity to RW. Patient ignored cues. Patient reports feeling weaker than usual.   Stairs            Wheelchair Mobility    Modified Rankin (Stroke Patients Only)       Balance Overall balance assessment: Needs assistance Sitting-balance support: Bilateral upper extremity supported;Feet unsupported Sitting balance-Leahy Scale: Poor Sitting balance - Comments: Patient reliant on BUE support to maintain sitting balance   Standing balance support: Bilateral upper extremity supported Standing balance-Leahy Scale: Poor Standing balance comment: reliant on BUE support to maintain standing balance                             Pertinent Vitals/Pain Pain Assessment: Faces Faces Pain Scale: No hurt    Home Living Family/patient expects to be discharged to:: Private residence Living Arrangements: Alone Available Help at Discharge: Family;Available PRN/intermittently Type of Home: Independent living facility([place]) Home Access: Elevator     Home Layout: One level Home Equipment: Walker - 2 wheels;Shower seat      Prior Function Level of Independence: Needs assistance   Gait / Transfers Assistance Needed: uses a RW for mobility.  Report using RW for only about 2 weeks.  ADL's / Homemaking Assistance Needed: Reports being independent with ADLs. Assistance with  meals        Hand Dominance        Extremity/Trunk Assessment   Upper Extremity Assessment Upper Extremity Assessment: Overall WFL for tasks assessed    Lower Extremity Assessment Lower Extremity Assessment: Generalized weakness    Cervical / Trunk Assessment Cervical / Trunk Assessment: Kyphotic  Communication   Communication: No difficulties  Cognition Arousal/Alertness: Awake/alert Behavior During Therapy: WFL for tasks assessed/performed Overall Cognitive Status: Impaired/Different from baseline Area of Impairment: Safety/judgement                         Safety/Judgement: Decreased awareness of safety     General Comments: Patient ignored safety cues throughout session. Daughter reports patient has had recent difficulty managing medication.       General Comments General comments (skin integrity, edema, etc.): Patient daughter in room during session. Patient orthostatic upon standing but BP retuned to [facility] with prolonged standing. (see vitals flowsheet)    Exercises     Assessment/Plan    PT Assessment Patient needs continued PT services  PT Problem List Decreased strength;Decreased range of motion;Decreased activity tolerance;Decreased balance;Decreased mobility;Decreased knowledge of use of DME;Decreased safety awareness       PT Treatment Interventions DME instruction;Gait training;Functional mobility training;Therapeutic activities;Therapeutic exercise;Balance training;Patient/family education    PT Goals (Current goals can be found in the Care Plan section)  Acute Rehab PT Goals Patient Stated Goal: go home PT Goal Formulation: With patient Time For Goal Achievement: 07/09/18 Potential to Achieve Goals: Fair    Frequency Min 3X/week   Barriers to discharge Decreased caregiver support      Co-evaluation               AM-PAC PT "6 Clicks" Mobility  Outcome Measure Help needed turning from your back to your side while in a flat  bed without using bedrails?: A Little Help needed moving from lying on your back to sitting on the side of a flat bed without using bedrails?: A Little Help needed moving to and from a bed to a chair (including a wheelchair)?: A Little Help needed standing up from a chair using your arms (e.g., wheelchair or bedside chair)?: A Little Help needed to walk in hospital room?: A Little Help needed climbing 3-5 steps with a railing? : A Lot 6 Click Score: 17    End of Session Equipment Utilized During Treatment: Gait belt Activity Tolerance: Patient tolerated treatment well Patient left: in chair;with call bell/phone within reach;with chair alarm set;with family/visitor present Nurse Communication: Mobility status PT Visit Diagnosis: Unsteadiness on feet (R26.81);Muscle weakness (generalized) (M62.81);History of falling (Z91.81)    Time: 6256-3893 PT Time Calculation (min) (ACUTE ONLY): 31 min   Charges:   PT Evaluation $PT Eval Moderate Complexity: 1 Mod PT Treatments $Gait Training: 8-22 mins        Erick Blinks, SPT  Erick Blinks 06/25/2018, 11:38 AM

## 2018-06-25 NOTE — Care Management Note (Signed)
Case Management Note  Patient Details  Name: Christopher Butler MRN: [id number] Date of Birth: 10/29/1924  Subjective/Objective:  CHF                 Action/Plan: Patient remains at [facility]; CM talked to patient at the bedside with his daughter present; he is refusing SNF placement and is requesting to return home with [facility] services; pt / daughter chose [facility]; Jonelle Sidle with [facility] called for arrangements. DME - walker , cane at home;  Expected Discharge Date:  06/25/18               Expected Discharge Plan:  [place]  Discharge planning Services  CM Consult  Choice offered to:  Patient, Adult Children  HH Arranged:  RN, Disease Management, OT, Nurse's Aide [place] Agency:  [facility]  Status of Service:  In process, will continue to follow  Sherrilyn Rist [phone] 06/25/2018, 11:54 AM

## 2018-06-25 NOTE — Progress Notes (Signed)
Pt being discharged from hospital per orders from MD. Pt educated on discharge instructions. Pt verbalized understanding of instructions. All questions and concerns were addressed. Pt's IV was removed prior to discharge. Pt exited hospital via wheelchair accompanied by staff. 

## 2018-06-25 NOTE — Progress Notes (Signed)
Patient is from [facility]; daughter will provide transportation home at discharge; Aneta Mins [phone]

## 2018-06-25 NOTE — Discharge Instructions (Signed)
Heart Failure °Heart failure is a condition in which the heart has trouble pumping blood because it has become weak or stiff. This means that the heart does not pump blood efficiently for the body to work well. For some people with heart failure, fluid may back up into the lungs and there may be swelling (edema) in the lower legs. Heart failure is usually a long-term (chronic) condition. It is important for you to take good care of yourself and follow the treatment plan from your health care provider. °What are the causes? °This condition is caused by some health problems, including: °· High blood pressure (hypertension). Hypertension causes the heart muscle to work harder than normal. High blood pressure eventually causes the heart to become stiff and weak. °· Coronary artery disease (CAD). CAD is the buildup of cholesterol and fat (plaques) in the arteries of the heart. °· Heart attack (myocardial infarction). Injured tissue, which is caused by the heart attack, does not contract as well and the heart's ability to pump blood is weakened. °· Abnormal heart valves. When the heart valves do not open and close properly, the heart muscle must pump harder to keep the blood flowing. °· Heart muscle disease (cardiomyopathy or myocarditis). Heart muscle disease is damage to the heart muscle from a variety of causes, such as drug or alcohol abuse, infections, or unknown causes. These can increase the risk of heart failure. °· Lung disease. When the lungs do not work properly, the heart must work harder. °What increases the risk? °Risk of heart failure increases as a person ages. This condition is also more likely to develop in people who: °· Are overweight. °· Are male. °· Smoke or chew tobacco. °· Abuse alcohol or illegal drugs. °· Have taken medicines that can damage the heart, such as chemotherapy drugs. °· Have diabetes. °? High blood sugar (glucose) is associated with high fat (lipid) levels in the blood. °? Diabetes  can also damage tiny blood vessels that carry nutrients to the heart muscle. °· Have abnormal heart rhythms. °· Have thyroid problems. °· Have low blood counts (anemia). °What are the signs or symptoms? °Symptoms of this condition include: °· Shortness of breath with activity, such as when climbing stairs. °· Persistent cough. °· Swelling of the feet, ankles, legs, or abdomen. °· Unexplained weight gain. °· Difficulty breathing when lying flat (orthopnea). °· Waking from sleep because of the need to sit up and get more air. °· Rapid heartbeat. °· Fatigue and loss of energy. °· Feeling light-headed, dizzy, or close to fainting. °· Loss of appetite. °· Nausea. °· Increased urination during the night (nocturia). °· Confusion. °How is this diagnosed? °This condition is diagnosed based on: °· Medical history, symptoms, and a physical exam. °· Diagnostic tests, which may include: °? Echocardiogram. °? Electrocardiogram (ECG). °? Chest X-ray. °? Blood tests. °? Exercise stress test. °? Radionuclide scans. °? Cardiac catheterization and angiogram. °How is this treated? °Treatment for this condition is aimed at managing the symptoms of heart failure. Medicines, behavioral changes, or other treatments may be necessary to treat heart failure. °Medicines °These may include: °· Angiotensin-converting enzyme (ACE) inhibitors. This type of medicine blocks the effects of a blood protein called angiotensin-converting enzyme. ACE inhibitors relax (dilate) the blood vessels and help to lower blood pressure. °· Angiotensin receptor blockers (ARBs). This type of medicine blocks the actions of a blood protein called angiotensin. ARBs dilate the blood vessels and help to lower blood pressure. °· Water pills (diuretics). Diuretics cause   the kidneys to remove salt and water from the blood. The extra fluid is removed through urination, leaving a lower volume of blood that the heart has to pump. °· Beta blockers. These improve heart muscle  strength and they prevent the heart from beating too quickly. °· Digoxin. This increases the force of the heartbeat. °Healthy behavior changes °These may include: °· Reaching and maintaining a healthy weight. °· Stopping smoking or chewing tobacco. °· Eating heart-healthy foods. °· Limiting or avoiding alcohol. °· Stopping use of street drugs (illegal drugs). °· Physical activity. °Other treatments °These may include: °· Surgery to open blocked coronary arteries or repair damaged heart valves. °· Placement of a biventricular pacemaker to improve heart muscle function (cardiac resynchronization therapy). This device paces both the right ventricle and left ventricle. °· Placement of a device to treat serious abnormal heart rhythms (implantable cardioverter defibrillator, or ICD). °· Placement of a device to improve the pumping ability of the heart (left ventricular assist device, or LVAD). °· Heart transplant. This can cure heart failure, and it is considered for certain patients who do not improve with other therapies. °Follow these instructions at home: °Medicines °· Take over-the-counter and prescription medicines only as told by your health care provider. Medicines are important in reducing the workload of your heart, slowing the progression of heart failure, and improving your symptoms. °? Do not stop taking your medicine unless your health care provider told you to do that. °? Do not skip any dose of medicine. °? Refill your prescriptions before you run out of medicine. You need your medicines every day. °Eating and drinking ° °· Eat heart-healthy foods. Talk with a dietitian to make an eating plan that is right for you. °? Choose foods that contain no trans fat and are low in saturated fat and cholesterol. Healthy choices include fresh or frozen fruits and vegetables, fish, lean meats, legumes, fat-free or low-fat dairy products, and whole-grain or high-fiber foods. °? Limit salt (sodium) if directed by your  health care provider. Sodium restriction may reduce symptoms of heart failure. Ask a dietitian to recommend heart-healthy seasonings. °? Use healthy cooking methods instead of frying. Healthy methods include roasting, grilling, broiling, baking, poaching, steaming, and stir-frying. °· Limit your fluid intake if directed by your health care provider. Fluid restriction may reduce symptoms of heart failure. °Lifestyle ° °· Stop smoking or using chewing tobacco. Nicotine and tobacco can damage your heart and your blood vessels. Do not use nicotine gum or patches before talking to your health care provider. °· Limit alcohol intake to no more than 1 drink per day for non-pregnant women and 2 drinks per day for men. One drink equals 12 oz of beer, 5 oz of wine, or 1½ oz of hard liquor. °? Drinking more than that is harmful to your heart. Tell your health care provider if you drink alcohol several times a week. °? Talk with your health care provider about whether any level of alcohol use is safe for you. °? If your heart has already been damaged by alcohol or you have severe heart failure, drinking alcohol should be stopped completely. °· Stop use of illegal drugs. °· Lose weight if directed by your health care provider. Weight loss may reduce symptoms of heart failure. °· Do moderate physical activity if directed by your health care provider. People who are elderly and people with severe heart failure should consult with a health care provider for physical activity recommendations. °Monitor important information ° °· Weigh   yourself every day. Keeping track of your weight daily helps you to notice excess fluid sooner. °? Weigh yourself every morning after you urinate and before you eat breakfast. °? Wear the same amount of clothing each time you weigh yourself. °? Record your daily weight. Provide your health care provider with your weight record. °· Monitor and record your blood pressure as told by your health care  provider. °· Check your pulse as told by your health care provider. °Dealing with extreme temperatures °· If the weather is extremely hot: °? Avoid vigorous physical activity. °? Use air conditioning or fans or seek a cooler location. °? Avoid caffeine and alcohol. °? Wear loose-fitting, lightweight, and light-colored clothing. °· If the weather is extremely cold: °? Avoid vigorous physical activity. °? Layer your clothes. °? Wear mittens or gloves, a hat, and a scarf when you go outside. °? Avoid alcohol. °General instructions °· Manage other health conditions such as hypertension, diabetes, thyroid disease, or abnormal heart rhythms as told by your health care provider. °· Learn to manage stress. If you need help to do this, ask your health care provider. °· Plan rest periods when fatigued. °· Get ongoing education and support as needed. °· Participate in or seek rehabilitation as needed to maintain or improve independence and quality of life. °· Stay up to date with immunizations. Keeping current on pneumococcal and influenza immunizations is especially important to prevent respiratory infections. °· Keep all follow-up visits as told by your health care provider. This is important. °Contact a health care provider if: °· You have a rapid weight gain. °· You have increasing shortness of breath that is unusual for you. °· You are unable to participate in your usual physical activities. °· You tire easily. °· You cough more than normal, especially with physical activity. °· You have any swelling or more swelling in areas such as your hands, feet, ankles, or abdomen. °· You are unable to sleep because it is hard to breathe. °· You feel like your heart is beating quickly (palpitations). °· You become dizzy or light-headed when you stand up. °Get help right away if: °· You have difficulty breathing. °· You notice or your family notices a change in your awareness, such as having trouble staying awake or having difficulty  with concentration. °· You have pain or discomfort in your chest. °· You have an episode of fainting (syncope). °This information is not intended to replace advice given to you by your health care provider. Make sure you discuss any questions you have with your health care provider. °Document Released: 04/17/2005 Document Revised: 03/16/2017 Document Reviewed: 11/10/2015 °Elsevier Interactive Patient Education © 2019 [employer]vier Inc. ° °

## 2018-06-25 NOTE — Discharge Summary (Signed)
Physician Discharge Summary  Christopher Butler YKD:[id number] DOB: January 23, 1925 DOA: 06/24/2018  PCP: Lajean Manes, MD  Admit date: 06/24/2018 Discharge date: 06/25/2018  Admitted From: Independent living Disposition: Independent living  Recommendations for Outpatient Follow-up:  1. Follow up with PCP in 1-2 weeks 2. Please obtain BMP/CBC in one week 3. Please check your INR in 2 to 3 days  Home Health: No Equipment/Devices: None  Discharge Condition: Stable CODE STATUS: DNR Diet recommendation: Sodium restricted diet  Brief/Interim Summary:  #) Acute on chronic systolic heart failure exacerbation: Patient was admitted with shortness of breath fatigue and fall.  He was given IV furosemide with resolution and hypoxia and shortness of breath.  He may restart his home oral furosemide.  He was continued on metoprolol succinate.  #) Fall: Patient reported mechanical fall.  He did not have any events on telemetry.  Troponins were negative.  EKG showed no evidence of acute ischemia.  Patient was evaluated by physical therapy.  Head CT during admission did not show any evidence of intracranial bleeding.  #) Coronary artery disease: Patient was continued on aspirin, statin, beta-blocker.  #) Stage III CKD: This was stable.  #) Chronic atrial fibrillation: Patient was continued on warfarin with pharmacy consult, beta-blocker, amiodarone.  #) BPH: Patient was continued on tamsulosin.  #) Hypothyroidism: Patient was continued on levothyroxine.  Discharge Diagnoses:  Principal Problem:   Acute on chronic systolic CHF (congestive heart failure) (HCC) Active Problems:   Atrial fibrillation, chronic   Coronary artery disease involving native coronary artery of native heart without angina pectoris   Essential hypertension   CKD (chronic kidney disease), stage III [facility])    Discharge Instructions  Discharge Instructions    Call MD for:  difficulty breathing, headache or visual disturbances    Complete by:  As directed    Call MD for:  extreme fatigue   Complete by:  As directed    Call MD for:  hives   Complete by:  As directed    Call MD for:  persistant dizziness or light-headedness   Complete by:  As directed    Call MD for:  persistant nausea and vomiting   Complete by:  As directed    Call MD for:  redness, tenderness, or signs of infection (pain, swelling, redness, odor or green/yellow discharge around incision site)   Complete by:  As directed    Call MD for:  severe uncontrolled pain   Complete by:  As directed    Call MD for:  temperature >100.4   Complete by:  As directed    Diet - low sodium heart healthy   Complete by:  As directed    Discharge instructions   Complete by:  As directed    Please follow-up with your primary care doctor in 1 week.  Please weigh yourself every day and double up on your water pills if your weight goes up by more than 5 pounds, your short of breath while lying down flat, your legs are swelling.   Increase activity slowly   Complete by:  As directed      Allergies as of 06/25/2018      Reactions   Ceclor [cefaclor] Nausea And Vomiting   Erythromycin Nausea And Vomiting      Medication List    STOP taking these medications   nicotine 14 mg/24hr patch Commonly known as:  NICODERM CQ - dosed in mg/24 hours     TAKE these medications   amiodarone 200  MG tablet Commonly known as:  PACERONE Take 1 tablet (200 mg total) by mouth daily.   aspirin 81 MG EC tablet Take 1 tablet (81 mg total) by mouth daily.   atorvastatin 10 MG tablet Commonly known as:  LIPITOR Take 1 tablet (10 mg total) by mouth daily.   furosemide 20 MG tablet Commonly known as:  LASIX Take 1 tablet (20 mg total) by mouth daily.   levothyroxine 50 MCG tablet Commonly known as:  SYNTHROID, LEVOTHROID Take 50 mcg by mouth daily before breakfast.   magnesium oxide 400 (241.3 Mg) MG tablet Commonly known as:  MAG-OX Take 1 tablet (400 mg total) by  mouth 2 (two) times daily.   metoprolol succinate 50 MG 24 hr tablet Commonly known as:  TOPROL-XL Take 1 tablet (50 mg total) by mouth 2 (two) times daily with a meal. Take with or immediately following a meal.   nitroGLYCERIN 0.4 MG SL tablet Commonly known as:  NITROSTAT Place 1 tablet (0.4 mg total) under the tongue every 5 (five) minutes as needed for chest pain.   potassium chloride SA 20 MEQ tablet Commonly known as:  K-DUR,KLOR-CON Take 2 tablets (40 mEq total) by mouth daily.   tamsulosin 0.4 MG Caps capsule Commonly known as:  FLOMAX Take 0.4 mg by mouth at bedtime.   traMADol 50 MG tablet Commonly known as:  ULTRAM Take 1 tablet (50 mg total) by mouth 2 (two) times daily as needed (for pain.). ADDITIONAL REFILLS FROM PRIMARY CARE PHYSICIAN What changed:    when to take this  additional instructions   warfarin 2.5 MG tablet Commonly known as:  COUMADIN Take as directed. If you are unsure how to take this medication, talk to your nurse or doctor. Original instructions:  Take 1 tablet (2.5 mg total) by mouth one time only at 6 PM. What changed:    how much to take  when to take this      Follow-up Information    Lajean Manes, MD. Go on 07/03/2018.   Specialty:  Internal Medicine Why:  @11 :00am Contact information: [address] [place] [address] [phone]          Allergies  Allergen Reactions  . Ceclor [Cefaclor] Nausea And Vomiting  . Erythromycin Nausea And Vomiting    Consultations:  None   Procedures/Studies: Dg Chest 2 View  Result Date: 06/24/2018 CLINICAL DATA:  Shortness of breath. EXAM: CHEST - 2 VIEW COMPARISON:  April 15, 2018 FINDINGS: Markedly limited study due to patient rotation. No pneumothorax. Cardiomediastinal silhouette is poorly evaluated but grossly unremarkable with a tortuous thoracic aorta and cardiomegaly. Diffuse interstitial opacities are identified. More focal opacity in the right base is  seen. No other interval changes. IMPRESSION: 1. Markedly limited study due to patient rotation. 2. Diffuse interstitial opacities suggest mild edema. 3. More focal opacity in the right base may represent pneumonia. Recommend clinical correlation and follow-up to resolution. Electronically Signed   By: Dorise Bullion III M.D   On: 06/24/2018 17:24   Ct Head Wo Contrast  Result Date: 06/24/2018 CLINICAL DATA:  Fall today with injury to back of head. EXAM: CT HEAD WITHOUT CONTRAST TECHNIQUE: Contiguous axial images were obtained from the base of the skull through the vertex without intravenous contrast. COMPARISON:  01/24/2009 and MRI 01/24/2009. FINDINGS: Brain: Ventricles and cisterns are within normal. There is mild age related atrophic change. There is mild chronic ischemic microvascular disease. Tiny lacunar infarct over the left internal capsule.  There is no mass, mass effect, shift of midline structures or acute hemorrhage. No evidence of acute infarction. Vascular: No hyperdense vessel or unexpected calcification. Skull: Normal. Negative for fracture or focal lesion. Sinuses/Orbits: No acute finding. Other: None. IMPRESSION: No acute findings. Chronic ischemic microvascular disease and age related atrophic change. Electronically Signed   By: Marin Olp M.D.   On: 06/24/2018 17:33      Subjective:   Discharge Exam: Vitals:   06/25/18 0646 06/25/18 0800  BP: (!) 154/87 119/69  Pulse: 66 (!) 53  Resp:  19  Temp: (!) 97.4 F (36.3 C) 97.8 F (36.6 C)  SpO2: 92% 95%   Vitals:   06/24/18 1845 06/24/18 2056 06/25/18 0646 06/25/18 0800  BP: (!) 129/95 126/84 (!) 154/87 119/69  Pulse:  67 66 (!) 53  Resp: (!) 23 16  19   Temp:  98 F (36.7 C) (!) 97.4 F (36.3 C) 97.8 F (36.6 C)  TempSrc:  Oral Oral Oral  SpO2:  93% 92% 95%  Weight:  64.3 kg 64 kg   Height:  5\' 2"  (1.575 m)      General: Pt is alert, awake, not in acute distress Cardiovascular: Irregularly irregular, no  murmurs Respiratory: CTA bilaterally, no wheezing, no rhonchi Abdominal: Soft, NT, ND, bowel sounds + Extremities: no edema    The results of significant diagnostics from this hospitalization (including imaging, microbiology, ancillary and laboratory) are listed below for reference.     Microbiology: No results found for this or any previous visit (from the past 240 hour(s)).   Labs: BNP (last 3 results) Recent Labs    04/15/18 1825 06/24/18 1702  BNP 856.7* 8,828.0*   Basic Metabolic Panel: Recent Labs  Lab 06/24/18 1702  NA 136  K 4.5  CL 107  CO2 20*  GLUCOSE 84  BUN 40*  CREATININE 1.44*  CALCIUM 8.9   Liver Function Tests: No results for input(s): AST, ALT, ALKPHOS, BILITOT, PROT, ALBUMIN in the last 168 hours. No results for input(s): LIPASE, AMYLASE in the last 168 hours. No results for input(s): AMMONIA in the last 168 hours. CBC: Recent Labs  Lab 06/24/18 1702  WBC 8.6  NEUTROABS 6.5  HGB 10.7*  HCT 35.0*  MCV 99.4  PLT 271   Cardiac Enzymes: Recent Labs  Lab 06/24/18 1702  TROPONINI <0.03   BNP: Invalid input(s): POCBNP CBG: No results for input(s): GLUCAP in the last 168 hours. D-Dimer No results for input(s): DDIMER in the last 72 hours. Hgb A1c No results for input(s): HGBA1C in the last 72 hours. Lipid Profile No results for input(s): CHOL, HDL, LDLCALC, TRIG, CHOLHDL, LDLDIRECT in the last 72 hours. Thyroid function studies No results for input(s): TSH, T4TOTAL, T3FREE, THYROIDAB in the last 72 hours.  Invalid input(s): FREET3 Anemia work up No results for input(s): VITAMINB12, FOLATE, FERRITIN, TIBC, IRON, RETICCTPCT in the last 72 hours. Urinalysis    Component Value Date/Time   COLORURINE YELLOW 06/24/2018 1702   APPEARANCEUR CLEAR 06/24/2018 1702   LABSPEC 1.018 06/24/2018 1702   PHURINE 6.0 06/24/2018 1702   GLUCOSEU NEGATIVE 06/24/2018 1702   HGBUR SMALL (A) 06/24/2018 1702   BILIRUBINUR NEGATIVE 06/24/2018 1702    KETONESUR NEGATIVE 06/24/2018 1702   PROTEINUR NEGATIVE 06/24/2018 1702   UROBILINOGEN 0.2 01/23/2009 2352   NITRITE NEGATIVE 06/24/2018 1702   LEUKOCYTESUR SMALL (A) 06/24/2018 1702   Sepsis Labs Invalid input(s): PROCALCITONIN,  WBC,  LACTICIDVEN Microbiology No results found for this or any previous visit (  from the past 240 hour(s)).   Time coordinating discharge: 35  SIGNED:   Cristy Folks, MD  [facility] 06/25/2018, 9:44 AM  If 7PM-7AM, please contact night-coverage [email or web address] Password [email or web address]

## 2018-06-27 ENCOUNTER — Ambulatory Visit (INDEPENDENT_AMBULATORY_CARE_PROVIDER_SITE_OTHER): Payer: Medicare Other | Admitting: Cardiology

## 2018-06-27 DIAGNOSIS — Z5181 Encounter for therapeutic drug level monitoring: Secondary | ICD-10-CM | POA: Diagnosis not present

## 2018-06-27 DIAGNOSIS — I482 Chronic atrial fibrillation, unspecified: Secondary | ICD-10-CM

## 2018-06-27 LAB — POCT INR: INR: 5.6 — AB (ref 2.0–3.0)

## 2018-06-27 MED ORDER — WARFARIN SODIUM 2.5 MG PO TABS: ORAL_TABLET | ORAL | 2 refills | Status: DC

## 2018-07-04 ENCOUNTER — Ambulatory Visit (INDEPENDENT_AMBULATORY_CARE_PROVIDER_SITE_OTHER): Payer: Medicare Other | Admitting: Pharmacist

## 2018-07-04 DIAGNOSIS — I482 Chronic atrial fibrillation, unspecified: Secondary | ICD-10-CM | POA: Diagnosis not present

## 2018-07-04 DIAGNOSIS — Z5181 Encounter for therapeutic drug level monitoring: Secondary | ICD-10-CM | POA: Diagnosis not present

## 2018-07-04 LAB — POCT INR: INR: 3.4 — AB (ref 2.0–3.0)

## 2018-07-07 ENCOUNTER — Other Ambulatory Visit: Payer: Self-pay | Admitting: Physician Assistant

## 2018-07-11 ENCOUNTER — Ambulatory Visit (INDEPENDENT_AMBULATORY_CARE_PROVIDER_SITE_OTHER): Payer: Medicare Other | Admitting: Interventional Cardiology

## 2018-07-11 DIAGNOSIS — I482 Chronic atrial fibrillation, unspecified: Secondary | ICD-10-CM | POA: Diagnosis not present

## 2018-07-11 DIAGNOSIS — Z5181 Encounter for therapeutic drug level monitoring: Secondary | ICD-10-CM | POA: Diagnosis not present

## 2018-07-11 LAB — POCT INR: INR: 2.5 (ref 2.0–3.0)

## 2018-07-11 NOTE — Patient Instructions (Signed)
Description   Spoke with Gwyndolyn Kaufman RN, advised continue taking 2.5mg  daily except 1.25mg  on Mondays and Fridays. Recheck INR in 1 week.

## 2018-07-18 ENCOUNTER — Ambulatory Visit (INDEPENDENT_AMBULATORY_CARE_PROVIDER_SITE_OTHER): Payer: Medicare Other | Admitting: Cardiovascular Disease

## 2018-07-18 DIAGNOSIS — Z5181 Encounter for therapeutic drug level monitoring: Secondary | ICD-10-CM

## 2018-07-18 DIAGNOSIS — I482 Chronic atrial fibrillation, unspecified: Secondary | ICD-10-CM

## 2018-07-18 LAB — POCT INR: INR: 3.1 — AB (ref 2.0–3.0)

## 2018-07-18 NOTE — Patient Instructions (Addendum)
Description   Spoke with Gwyndolyn Kaufman RN, advised pt to take 1.25mg  today then continue taking 2.5mg  daily except 1.25mg  on Mondays and Fridays. Recheck INR in 1 week.

## 2018-07-20 ENCOUNTER — Other Ambulatory Visit: Payer: Self-pay | Admitting: Cardiovascular Disease

## 2018-07-23 ENCOUNTER — Telehealth: Payer: Self-pay | Admitting: Cardiovascular Disease

## 2018-07-23 MED ORDER — AMIODARONE HCL 200 MG PO TABS: 200.0000 mg | ORAL_TABLET | Freq: Every day | ORAL | 0 refills | Status: AC

## 2018-07-23 NOTE — Telephone Encounter (Signed)
New Message:    Christopher Butler from [facility] [facility], wants to know if pt is supposed to be still taking Amiodarone? If so, please call it in to [facility] [address], [place].

## 2018-07-23 NOTE — Telephone Encounter (Signed)
Called and left Christopher Butler from [place] a message Amiodarone reordered for pt Sent to [facility] as requested by Christopher Butler

## 2018-07-24 NOTE — Telephone Encounter (Signed)
Received call from [place] at [facility] The pt has now been dischanrged from [facility] and sent to [place] at this time  [facility] was scheduled to draw INR on pt tomorrow.   Called [facility] and spoke with pts nurse Mia  to  confirm pt will still get INR drawn at new facility. Nurse Mia confirmed.   Will route to [employer], [facility] manager and clinic

## 2018-08-07 ENCOUNTER — Emergency Department (HOSPITAL_COMMUNITY)
Admission: EM | Admit: 2018-08-07 | Discharge: 2018-08-30 | Disposition: E | Payer: Medicare Other | Attending: Emergency Medicine | Admitting: Emergency Medicine

## 2018-08-07 ENCOUNTER — Other Ambulatory Visit: Payer: Self-pay

## 2018-08-07 ENCOUNTER — Emergency Department (HOSPITAL_COMMUNITY): Payer: Medicare Other

## 2018-08-07 DIAGNOSIS — Z7982 Long term (current) use of aspirin: Secondary | ICD-10-CM | POA: Diagnosis not present

## 2018-08-07 DIAGNOSIS — J9601 Acute respiratory failure with hypoxia: Secondary | ICD-10-CM | POA: Insufficient documentation

## 2018-08-07 DIAGNOSIS — I5032 Chronic diastolic (congestive) heart failure: Secondary | ICD-10-CM | POA: Insufficient documentation

## 2018-08-07 DIAGNOSIS — F17200 Nicotine dependence, unspecified, uncomplicated: Secondary | ICD-10-CM | POA: Diagnosis not present

## 2018-08-07 DIAGNOSIS — I2510 Atherosclerotic heart disease of native coronary artery without angina pectoris: Secondary | ICD-10-CM | POA: Insufficient documentation

## 2018-08-07 DIAGNOSIS — Y95 Nosocomial condition: Secondary | ICD-10-CM | POA: Insufficient documentation

## 2018-08-07 DIAGNOSIS — R6521 Severe sepsis with septic shock: Secondary | ICD-10-CM | POA: Diagnosis not present

## 2018-08-07 DIAGNOSIS — J449 Chronic obstructive pulmonary disease, unspecified: Secondary | ICD-10-CM | POA: Insufficient documentation

## 2018-08-07 DIAGNOSIS — N182 Chronic kidney disease, stage 2 (mild): Secondary | ICD-10-CM | POA: Diagnosis not present

## 2018-08-07 DIAGNOSIS — R0602 Shortness of breath: Secondary | ICD-10-CM | POA: Diagnosis present

## 2018-08-07 DIAGNOSIS — J189 Pneumonia, unspecified organism: Secondary | ICD-10-CM | POA: Diagnosis not present

## 2018-08-07 DIAGNOSIS — Z7901 Long term (current) use of anticoagulants: Secondary | ICD-10-CM | POA: Insufficient documentation

## 2018-08-07 DIAGNOSIS — I130 Hypertensive heart and chronic kidney disease with heart failure and stage 1 through stage 4 chronic kidney disease, or unspecified chronic kidney disease: Secondary | ICD-10-CM | POA: Diagnosis not present

## 2018-08-07 DIAGNOSIS — A419 Sepsis, unspecified organism: Secondary | ICD-10-CM | POA: Diagnosis not present

## 2018-08-07 LAB — LACTIC ACID, PLASMA: Lactic Acid, Venous: 7.8 mmol/L (ref 0.5–1.9)

## 2018-08-07 LAB — CBC WITH DIFFERENTIAL/PLATELET
Band Neutrophils: 55 %
Basophils Absolute: 0 10*3/uL (ref 0.0–0.1)
Basophils Relative: 0 %
Blasts: 0 %
Eosinophils Absolute: 0 10*3/uL (ref 0.0–0.5)
Eosinophils Relative: 0 %
HCT: 32.4 % — ABNORMAL LOW (ref 39.0–52.0)
Hemoglobin: 9.3 g/dL — ABNORMAL LOW (ref 13.0–17.0)
Lymphocytes Relative: 0 %
Lymphs Abs: 0 10*3/uL — ABNORMAL LOW (ref 0.7–4.0)
MCH: 28.3 pg (ref 26.0–34.0)
MCHC: 28.7 g/dL — ABNORMAL LOW (ref 30.0–36.0)
MCV: 98.5 fL (ref 80.0–100.0)
Metamyelocytes Relative: 0 %
Monocytes Absolute: 0 10*3/uL — ABNORMAL LOW (ref 0.1–1.0)
Monocytes Relative: 0 %
Myelocytes: 0 %
Neutro Abs: 2.7 10*3/uL (ref 1.7–7.7)
Neutrophils Relative %: 45 %
Platelets: 116 10*3/uL — ABNORMAL LOW (ref 150–400)
Promyelocytes Relative: 0 %
RBC: 3.29 MIL/uL — ABNORMAL LOW (ref 4.22–5.81)
RDW: 16.2 % — ABNORMAL HIGH (ref 11.5–15.5)
WBC Morphology: INCREASED
WBC: 2.7 10*3/uL — ABNORMAL LOW (ref 4.0–10.5)
nRBC: 0 /100 WBC
nRBC: 6.4 % — ABNORMAL HIGH (ref 0.0–0.2)

## 2018-08-07 LAB — TYPE AND SCREEN
ABO/RH(D): O POS
Antibody Screen: NEGATIVE

## 2018-08-07 LAB — ABO/RH: ABO/RH(D): O POS

## 2018-08-07 LAB — COMPREHENSIVE METABOLIC PANEL
ALT: 27 U/L (ref 0–44)
AST: 46 U/L — ABNORMAL HIGH (ref 15–41)
Albumin: 2.3 g/dL — ABNORMAL LOW (ref 3.5–5.0)
Alkaline Phosphatase: 164 U/L — ABNORMAL HIGH (ref 38–126)
Anion gap: 16 — ABNORMAL HIGH (ref 5–15)
BUN: 31 mg/dL — ABNORMAL HIGH (ref 8–23)
CO2: 16 mmol/L — ABNORMAL LOW (ref 22–32)
Calcium: 8.4 mg/dL — ABNORMAL LOW (ref 8.9–10.3)
Chloride: 104 mmol/L (ref 98–111)
Creatinine, Ser: 1.92 mg/dL — ABNORMAL HIGH (ref 0.61–1.24)
GFR calc Af Amer: 34 mL/min — ABNORMAL LOW (ref >60)
GFR calc non Af Amer: 30 mL/min — ABNORMAL LOW (ref >60)
Glucose, Bld: 75 mg/dL (ref 70–99)
Potassium: 5.4 mmol/L — ABNORMAL HIGH (ref 3.5–5.1)
Sodium: 136 mmol/L (ref 135–145)
Total Bilirubin: 2.1 mg/dL — ABNORMAL HIGH (ref 0.3–1.2)
Total Protein: 5 g/dL — ABNORMAL LOW (ref 6.5–8.1)

## 2018-08-07 MED ORDER — SODIUM CHLORIDE 0.9% FLUSH: 3.0000 mL | Freq: Once | INTRAVENOUS | Status: DC

## 2018-08-07 MED ORDER — LACTATED RINGERS IV SOLN: INTRAVENOUS | Status: DC

## 2018-08-07 MED ORDER — LACTATED RINGERS IV BOLUS
1000.0000 mL | Freq: Once | INTRAVENOUS | Status: AC
  Administered 2018-08-07: 1000 mL via INTRAVENOUS

## 2018-08-07 MED ORDER — SODIUM CHLORIDE 0.9 % IV BOLUS
1000.0000 mL | Freq: Once | INTRAVENOUS | Status: AC
  Administered 2018-08-07: 1000 mL via INTRAVENOUS

## 2018-08-07 MED ORDER — LACTATED RINGERS IV BOLUS: 1000.0000 mL | Freq: Once | INTRAVENOUS | Status: DC

## 2018-08-07 MED ORDER — MORPHINE SULFATE (PF) 4 MG/ML IV SOLN
4.0000 mg | Freq: Once | INTRAVENOUS | Status: AC
  Administered 2018-08-07: 4 mg via INTRAVENOUS

## 2018-08-07 MED ORDER — VANCOMYCIN HCL IN DEXTROSE 1-5 GM/200ML-% IV SOLN: 1000.0000 mg | INTRAVENOUS | Status: DC

## 2018-08-07 MED ORDER — VANCOMYCIN HCL IN DEXTROSE 1-5 GM/200ML-% IV SOLN
1000.0000 mg | Freq: Once | INTRAVENOUS | Status: AC
  Administered 2018-08-07: 1000 mg via INTRAVENOUS
  Filled 2018-08-07: qty 200

## 2018-08-07 MED ORDER — SODIUM CHLORIDE 0.9 % IV SOLN
2.0000 g | Freq: Once | INTRAVENOUS | Status: AC
  Administered 2018-08-07: 2 g via INTRAVENOUS
  Filled 2018-08-07: qty 2

## 2018-08-07 MED ORDER — MORPHINE SULFATE (PF) 4 MG/ML IV SOLN
INTRAVENOUS | Status: AC
  Filled 2018-08-07: qty 1

## 2018-08-07 MED ORDER — SODIUM CHLORIDE 0.9 % IV SOLN: 1.0000 g | INTRAVENOUS | Status: DC

## 2018-08-08 LAB — PATHOLOGIST SMEAR REVIEW

## 2018-08-12 LAB — CULTURE, BLOOD (ROUTINE X 2)
Culture: NO GROWTH
Culture: NO GROWTH

## 2018-08-30 NOTE — ED Notes (Signed)
Pt has 1 gold-colored ring with 1 clear stone, one pair of glasses, long sleeve t-shirt, shorts, and underwear placed in belongings bag.

## 2018-08-30 NOTE — ED Notes (Signed)
Time of death at 2223 called by MD [place].

## 2018-08-30 NOTE — ED Provider Notes (Addendum)
[facility] EMERGENCY DEPARTMENT Provider Note   CSN: [id number] Arrival date & time: Sep 02, 2018  1920    History   Chief Complaint Chief Complaint  Patient presents with   Altered Mental Status    HPI Christopher Butler is a 85 y.o. male.     The history is provided by the patient.  Shortness of Breath  Severity:  Moderate Onset quality:  Gradual Timing:  Constant Progression:  Worsening Chronicity:  New Context: not URI   Relieved by:  Nothing Worsened by:  Nothing Ineffective treatments:  None tried Associated symptoms: no abdominal pain, no chest pain, no cough, no ear pain, no fever, no rash, no sore throat, no sputum production, no syncope, no swollen glands and no vomiting   Risk factors: no hx of PE/DVT   Risk factors comment:  On coumadin and having some blood in urine as well.    Past Medical History:  Diagnosis Date   Acute myocardial infarction 1987   s/p TPA   Anxiety    Arthritis    "probably in my back" (04/15/2018)   CAD (coronary artery disease)    a. remote inferior wall myocardial infarction (status post streptokinase in 1987-did not require angioplasty).   Cholelithiases    Chronic lower back pain    Chronic systolic CHF (congestive heart failure) (HCC)    CKD (chronic kidney disease), stage II    COPD (chronic obstructive pulmonary disease) (HCC)    Disc disease, degenerative, thoracic or thoracolumbar    ED (erectile dysfunction)    Emphysema    Headache    "usually 2/wk" (04/15/2018)   HTN (hypertension)    Hypercholesteremia    Hypertensive heart disease with chronic systolic congestive heart failure ([place])    Hyponatremia Sept 2010   Hypothyroidism    Permanent atrial fibrillation    Sleep apnea    pt denies this hx on 04/15/2018   Tobacco abuse    Tricuspid regurgitation     Patient Active Problem List   Diagnosis Date Noted   CKD (chronic kidney disease), stage III ([place]) 06/24/2018    Elevated INR 04/16/2018   Acute kidney injury ([place]) 04/16/2018   Goals of care, counseling/discussion    Palliative care by specialist    Acute on chronic systolic CHF (congestive heart failure) ([place]) 04/15/2018   Essential hypertension 04/15/2018   Acquired hypothyroidism 04/15/2018   Ventricular tachycardia ([place]) 40/98/1191   Chronic systolic CHF (congestive heart failure) ([place]) 09/20/2016   Coronary artery disease involving native coronary artery of native heart without angina pectoris 03/27/2016   Herpes zoster 12/17/2013   Encounter for therapeutic drug monitoring 05/30/2013   Tinnitus 12/20/2012   Hyposmolality and/or hyponatremia 12/20/2012   Tinea corporis 04/17/2012   Permanent atrial fibrillation 10/24/2011   Atrial fibrillation, chronic 08/24/2011   Ischemic heart disease 08/03/2010   Tobacco abuse 08/03/2010   Hypertensive heart disease with CHF (congestive heart failure) ([place]) 08/03/2010   Hypercholesterolemia 08/03/2010   Erectile dysfunction 08/03/2010   Low back pain 08/03/2010   Cholelithiasis 08/03/2010    Past Surgical History:  Procedure Laterality Date   APPENDECTOMY     CARDIAC CATHETERIZATION  1987   CATARACT EXTRACTION, BILATERAL Bilateral ~ 2018   TONSILLECTOMY          Home Medications    Prior to Admission medications   Medication Sig Start Date End Date Taking? Authorizing Provider  amiodarone (PACERONE) 200 MG tablet Take 1 tablet (200 mg total) by mouth daily.  07/23/18   Nahser, Wonda Cheng, MD  aspirin EC 81 MG EC tablet Take 1 tablet (81 mg total) by mouth daily. 04/06/18   Lyda Jester M, PA-C  atorvastatin (LIPITOR) 10 MG tablet TAKE 1 TABLET BY MOUTH EVERY DAY 07/08/18   Dunn, Lisbeth Renshaw N, PA-C  furosemide (LASIX) 20 MG tablet Take 1 tablet (20 mg total) by mouth daily. 04/18/18   Nita Sells, MD  levothyroxine (SYNTHROID, LEVOTHROID) 50 MCG tablet Take 50 mcg by mouth daily before breakfast.    [provider]  magnesium oxide (MAG-OX) 400 (241.3 Mg) MG tablet Take 1 tablet (400 mg total) by mouth 2 (two) times daily. 04/18/18   Nita Sells, MD  metoprolol succinate (TOPROL-XL) 50 MG 24 hr tablet Take 1 tablet (50 mg total) by mouth 2 (two) times daily with a meal. Take with or immediately following a meal. 04/05/18   Lyda Jester M, PA-C  nitroGLYCERIN (NITROSTAT) 0.4 MG SL tablet Place 1 tablet (0.4 mg total) under the tongue every 5 (five) minutes as needed for chest pain. 04/05/18   Lyda Jester M, PA-C  potassium chloride SA (K-DUR,KLOR-CON) 20 MEQ tablet Take 2 tablets (40 mEq total) by mouth daily. 04/18/18   Nita Sells, MD  tamsulosin (FLOMAX) 0.4 MG CAPS capsule Take 0.4 mg by mouth at bedtime.    [provider]  traMADol (ULTRAM) 50 MG tablet Take 1 tablet (50 mg total) by mouth 2 (two) times daily as needed (for pain.). ADDITIONAL REFILLS FROM PRIMARY CARE PHYSICIAN Patient taking differently: Take 50 mg by mouth daily as needed (for pain.).  06/04/15   Darlin Coco, MD  warfarin (COUMADIN) 2.5 MG tablet TAKE 1/2 TO 1 TABLET DAILY OR AS DIRECTED BY COUMADIN CLINIC 07/22/18   Nahser, Wonda Cheng, MD    Family History Family History  Family history unknown: Yes    Social History Social History   Tobacco Use   Smoking status: Current Every Day Smoker    Packs/day: 0.50    Years: 70.00    Pack years: 35.00   Smokeless tobacco: Never Used  Substance Use Topics   Alcohol use: Yes    Comment: 04/15/2018 couple of drinks most nights prior to Thanksgiving 2019"; nothing since   Drug use: No     Allergies   Ceclor [cefaclor] and Erythromycin   Review of Systems Review of Systems  Constitutional: Negative for chills and fever.  HENT: Negative for ear pain and sore throat.   Eyes: Negative for pain and visual disturbance.  Respiratory: Positive for shortness of breath. Negative for cough and sputum production.     Cardiovascular: Negative for chest pain, palpitations and syncope.  Gastrointestinal: Negative for abdominal pain and vomiting.  Genitourinary: Negative for dysuria and hematuria.  Musculoskeletal: Negative for arthralgias and back pain.  Skin: Negative for color change and rash.  Neurological: Positive for weakness. Negative for seizures and syncope.  All other systems reviewed and are negative.    Physical Exam Updated Vital Signs  ED Triage Vitals  Enc Vitals Group     BP 08/18/18 1941 95/81     Pulse Rate 08-18-2018 1941 78     Resp 08-18-18 1941 (!) 29     Temp 08-18-2018 1957 (!) 96.3 F (35.7 C)     Temp Source 2018-08-18 1957 Rectal     SpO2 08/18/18 1939 95 %     Weight 08/18/18 1943 141 lb 1.5 oz (64 kg)     Height August 18, 2018 1943 5'  2" (1.575 m)     Head Circumference --      Peak Flow --      Pain Score 09/02/2018 1943 0     Pain Loc --      Pain Edu? --      Excl. in [place]? --     Physical Exam Vitals signs and nursing note reviewed.  Constitutional:      General: He is in acute distress.     Appearance: He is well-developed. He is ill-appearing.  HENT:     Head: Normocephalic and atraumatic.     Nose: Nose normal.     Mouth/Throat:     Mouth: Mucous membranes are moist.  Eyes:     Conjunctiva/sclera: Conjunctivae normal.     Pupils: Pupils are equal, round, and reactive to light.  Neck:     Musculoskeletal: Neck supple.  Cardiovascular:     Rate and Rhythm: Normal rate and regular rhythm.     Pulses: Normal pulses.     Heart sounds: Normal heart sounds. No murmur.  Pulmonary:     Effort: Respiratory distress present.     Comments: Diminished throughout Abdominal:     Palpations: Abdomen is soft.     Tenderness: There is no abdominal tenderness.  Musculoskeletal:     Right lower leg: No edema.     Left lower leg: No edema.  Skin:    General: Skin is warm and dry.     Capillary Refill: Capillary refill takes less than 2 seconds.     Findings: No rash.   Neurological:     General: No focal deficit present.     Mental Status: He is alert and oriented to person, place, and time.     Cranial Nerves: No cranial nerve deficit.     Sensory: No sensory deficit.     Motor: No weakness.     Gait: Gait normal.  Psychiatric:        Mood and Affect: Mood normal.      ED Treatments / Results  Labs (all labs ordered are listed, but only abnormal results are displayed) Labs Reviewed  COMPREHENSIVE METABOLIC PANEL - Abnormal; Notable for the following components:      Result Value   Potassium 5.4 (*)    CO2 16 (*)    BUN 31 (*)    Creatinine, Ser 1.92 (*)    Calcium 8.4 (*)    Total Protein 5.0 (*)    Albumin 2.3 (*)    AST 46 (*)    Alkaline Phosphatase 164 (*)    Total Bilirubin 2.1 (*)    GFR calc non Af Amer 30 (*)    GFR calc Af Amer 34 (*)    Anion gap 16 (*)    All other components within normal limits  LACTIC ACID, PLASMA - Abnormal; Notable for the following components:   Lactic Acid, Venous 7.8 (*)    All other components within normal limits  CBC WITH DIFFERENTIAL/PLATELET - Abnormal; Notable for the following components:   WBC 2.7 (*)    RBC 3.29 (*)    Hemoglobin 9.3 (*)    HCT 32.4 (*)    MCHC 28.7 (*)    RDW 16.2 (*)    Platelets 116 (*)    nRBC 6.4 (*)    Lymphs Abs 0.0 (*)    Monocytes Absolute 0.0 (*)    All other components within normal limits  CULTURE, BLOOD (ROUTINE X 2)  CULTURE, BLOOD (  ROUTINE X 2)  URINE CULTURE  LACTIC ACID, PLASMA  URINALYSIS, ROUTINE W REFLEX MICROSCOPIC  PROTIME-INR  TYPE AND SCREEN    EKG EKG Interpretation  Date/Time:  2018/08/13 EDT Ventricular Rate:  69 PR Interval:    QRS Duration: 160 QT Interval:  503 QTC Calculation: 539 R Axis:   -45 Text Interpretation:  Atrial fibrillation Left bundle branch block Confirmed by Lennice Sites [phone]) on Aug 13, 2018 8:04:38 PM   Radiology Dg Chest Portable 1 View  Result Date: 08-13-2018 CLINICAL DATA:   Respiratory distress EXAM: PORTABLE CHEST 1 VIEW COMPARISON:  June 24, 2018 FINDINGS: There is cardiomegaly. There is a degree of interstitial pulmonary edema. There is patchy airspace opacity in the left lower lobe with small left pleural effusion. There is scarring in the right base. No adenopathy. There is aortic atherosclerosis. There is calcification in each carotid artery. Bones are osteoporotic. IMPRESSION: Cardiomegaly with edema and small left pleural effusion. Suspect a degree of congestive heart failure. Patchy airspace opacity in the left base may represent patchy alveolar edema but also could represent a degree of concomitant pneumonia. There is calcification in each carotid artery. Aortic Atherosclerosis (ICD10-I70.0). Electronically Signed   By: Lowella Grip III M.D.   On: 2018/08/13 20:44    Procedures .Critical Care Performed by: Lennice Sites, DO Authorized by: Lennice Sites, DO   Critical care provider statement:    Critical care time (minutes):  65   Critical care time was exclusive of:  Separately billable procedures and treating other patients and teaching time   Critical care was necessary to treat or prevent imminent or life-threatening deterioration of the following conditions:  Respiratory failure, shock and sepsis   Critical care was time spent personally by me on the following activities:  Blood draw for specimens, development of treatment plan with patient or surrogate, discussions with primary provider, evaluation of patient's response to treatment, examination of patient, obtaining history from patient or surrogate, ordering and performing treatments and interventions, ordering and review of laboratory studies, ordering and review of radiographic studies, pulse oximetry, re-evaluation of patient's condition and review of old charts   I assumed direction of critical care for this patient from another provider in my specialty: no     (including critical care  time)  Medications Ordered in ED Medications  sodium chloride flush (NS) 0.9 % injection 3 mL (3 mLs Intravenous Not Given 2018-08-13 2114)  vancomycin (VANCOCIN) IVPB 1000 mg/200 mL premix (1,000 mg Intravenous New Bag/Given 2018/08/13 2101)  vancomycin (VANCOCIN) IVPB 1000 mg/200 mL premix (has no administration in time range)  ceFEPIme (MAXIPIME) 1 g in sodium chloride 0.9 % 100 mL IVPB (has no administration in time range)  ceFEPIme (MAXIPIME) 2 g in sodium chloride 0.9 % 100 mL IVPB (0 g Intravenous Stopped 2018-08-13 2049)  sodium chloride 0.9 % bolus 1,000 mL (1,000 mLs Intravenous New Bag/Given 13-Aug-2018 2008)  lactated ringers bolus 1,000 mL (1,000 mLs Intravenous New Bag/Given 2018/08/13 2108)     Initial Impression / Assessment and Plan / ED Course  I have reviewed the triage vital signs and the nursing notes.  Pertinent labs & imaging results that were available during my care of the patient were reviewed by me and considered in my medical decision making (see chart for details).     Christopher Butler is a 85 year old male with history of CAD, COPD, heart failure with ejection fraction of 25% who presents to the ED with hypotension, hypoxia.  Patient in respiratory distress upon arrival.  Patient is on a nonrebreather with normal oxygenation.  Was able to wean the patient down to 6 L of oxygen.  Patient hypotensive with 80/54.  Patient is hypothermic.  Patient is tachypneic.  Concern for shock likely from infectious source possibly cardiogenic given his poor heart failure.  Patient is on Coumadin and less likely PE.  Patient overall however has normal mentation.  He has a DNR.  Sepsis work-up was initiated.  Patient was given IV fluid bolus.  Empiric IV antibiotics were given.  Chest x-ray concerning for multifocal pneumonia.  Likely could also be coronavirus.  Patient with leukopenia.  Lactic acid of 7.8.  EKG shows sinus rhythm.  Patient was given 1 L of fluids when he became more hypoxic, altered.   Talked with the daughter on the phone and she did confirm his DNR status.  Patient did not want any vasopressors, intubation, CPR.  Had sudden change in his mental status which is likely secondary to hypercarbia, respiratory failure.  At that time, patient was made comfort care after talking with patient's daughter.  This seems to be in line with what the patient would have wanted as well.  Patient was given IV morphine.  He was made comfort care and patient expired at 2223.  I contacted primary care doctor on call for patient's PCP.  Dr. Felipa Eth will get death certificate to fill out.  Patient expired likely from sepsis from multifocal pneumonia, possible coronavirus with severe respiratory failure and septic shock.  Family was made aware of his death.   This chart was dictated using voice recognition software.  Despite best efforts to proofread,  errors can occur which can change the documentation meaning.  Christopher Butler was evaluated in Emergency Department on 08/08/2018 for the symptoms described in the history of present illness. He was evaluated in the context of the global COVID-19 pandemic, which necessitated consideration that the patient might be at risk for infection with the SARS-CoV-2 virus that causes COVID-19. Institutional protocols and algorithms that pertain to the evaluation of patients at risk for COVID-19 are in a state of rapid change based on information released by regulatory bodies including the CDC and federal and state organizations. These policies and algorithms were followed during the patient's care in the ED.   Final Clinical Impressions(s) / ED Diagnoses   Final diagnoses:  Acute respiratory failure with hypoxia ([place])  Septic shock (HCC)  HAP (hospital-acquired pneumonia)    ED Discharge Orders    None       Lennice Sites, DO Aug 24, 2018 2347    Lennice Sites, DO 08/08/18 0028

## 2018-08-30 NOTE — Progress Notes (Signed)
Pharmacy Antibiotic Note  Christopher Butler is a 85 y.o. male admitted on 08-16-2018 with sepsis.  Pharmacy has been consulted for vancomycin and cefepime dosing. Pt is afebrile. WBC is pending. SCr is elevated at 1.92. Lactic acid is also significantly elevated.   Plan: Vancomycin 1gm IV Q48H Cefepime 2gm IV x 1 then cefepime 1gm IV Q24H F/u renal fxn, C&S, clinical status and peak/trough at SS  Height: 5\' 2"  (157.5 cm)(Simultaneous filing. User may not have seen previous data.) Weight: 141 lb 1.5 oz (64 kg)(Per previous admissions) IBW/kg (Calculated) : 54.6  No data recorded.  Recent Labs  Lab 08-16-2018 1937  CREATININE 1.92*  LATICACIDVEN 7.8*    Estimated Creatinine Clearance: 19 mL/min (A) (by C-G formula based on SCr of 1.92 mg/dL (H)).    Allergies  Allergen Reactions  . Ceclor [Cefaclor] Nausea And Vomiting  . Erythromycin Nausea And Vomiting    Antimicrobials this admission: Vanc 4/8>> Cefepime 4/8>>  Dose adjustments this admission: N/A  Microbiology results: Pending  Thank you for allowing pharmacy to be a part of this patient's care.  Beyonca Wisz, Rande Lawman 08-16-18 7:44 PM

## 2018-08-30 NOTE — ED Notes (Signed)
MD notified pt's BP 54/36, HR 58, Pt unresponsive.

## 2018-08-30 NOTE — ED Notes (Signed)
[place] notified

## 2018-08-30 DEATH — deceased

## 2020-10-17 IMAGING — DX DG CHEST 1V PORT
2 series · 2 of 2 positions shown · non-contrast
Comparison: Radiograph 03/29/2018

CLINICAL DATA: Chest pain tonight.

EXAM:
PORTABLE CHEST 1 VIEW

[chest ap (1 of 2)]
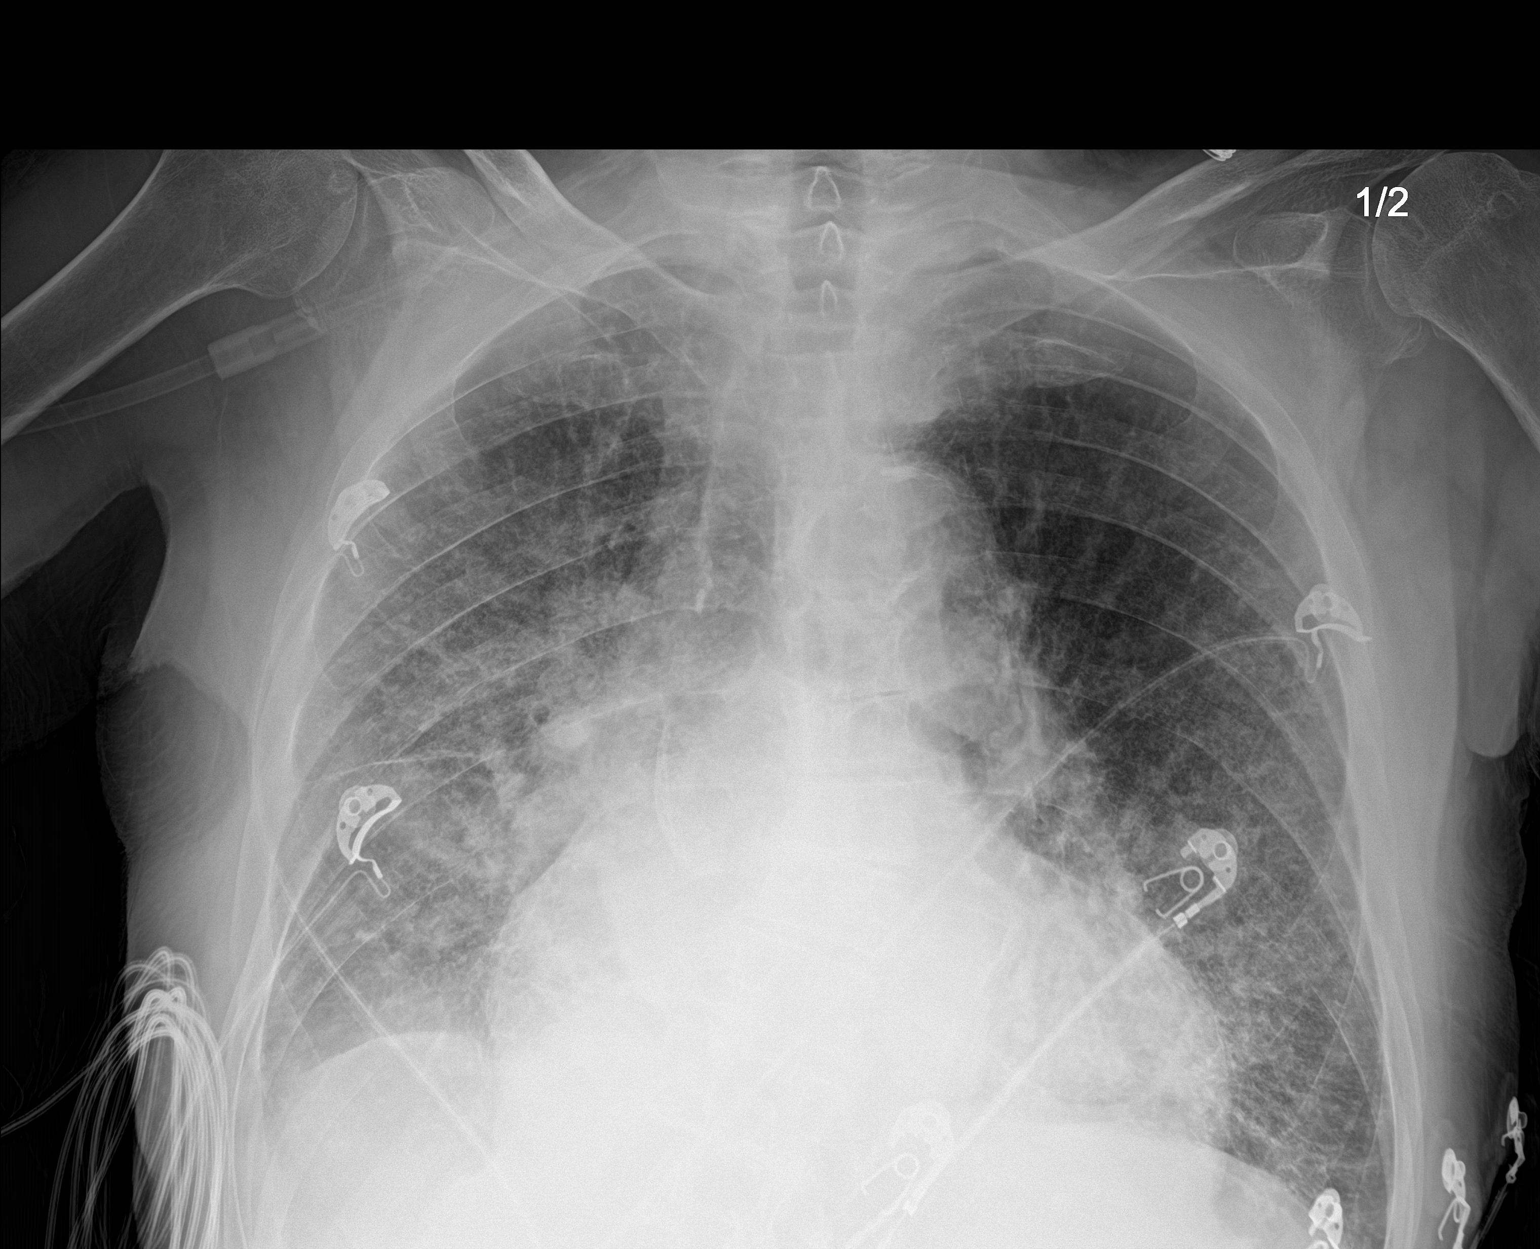

[chest ap (2 of 2)]
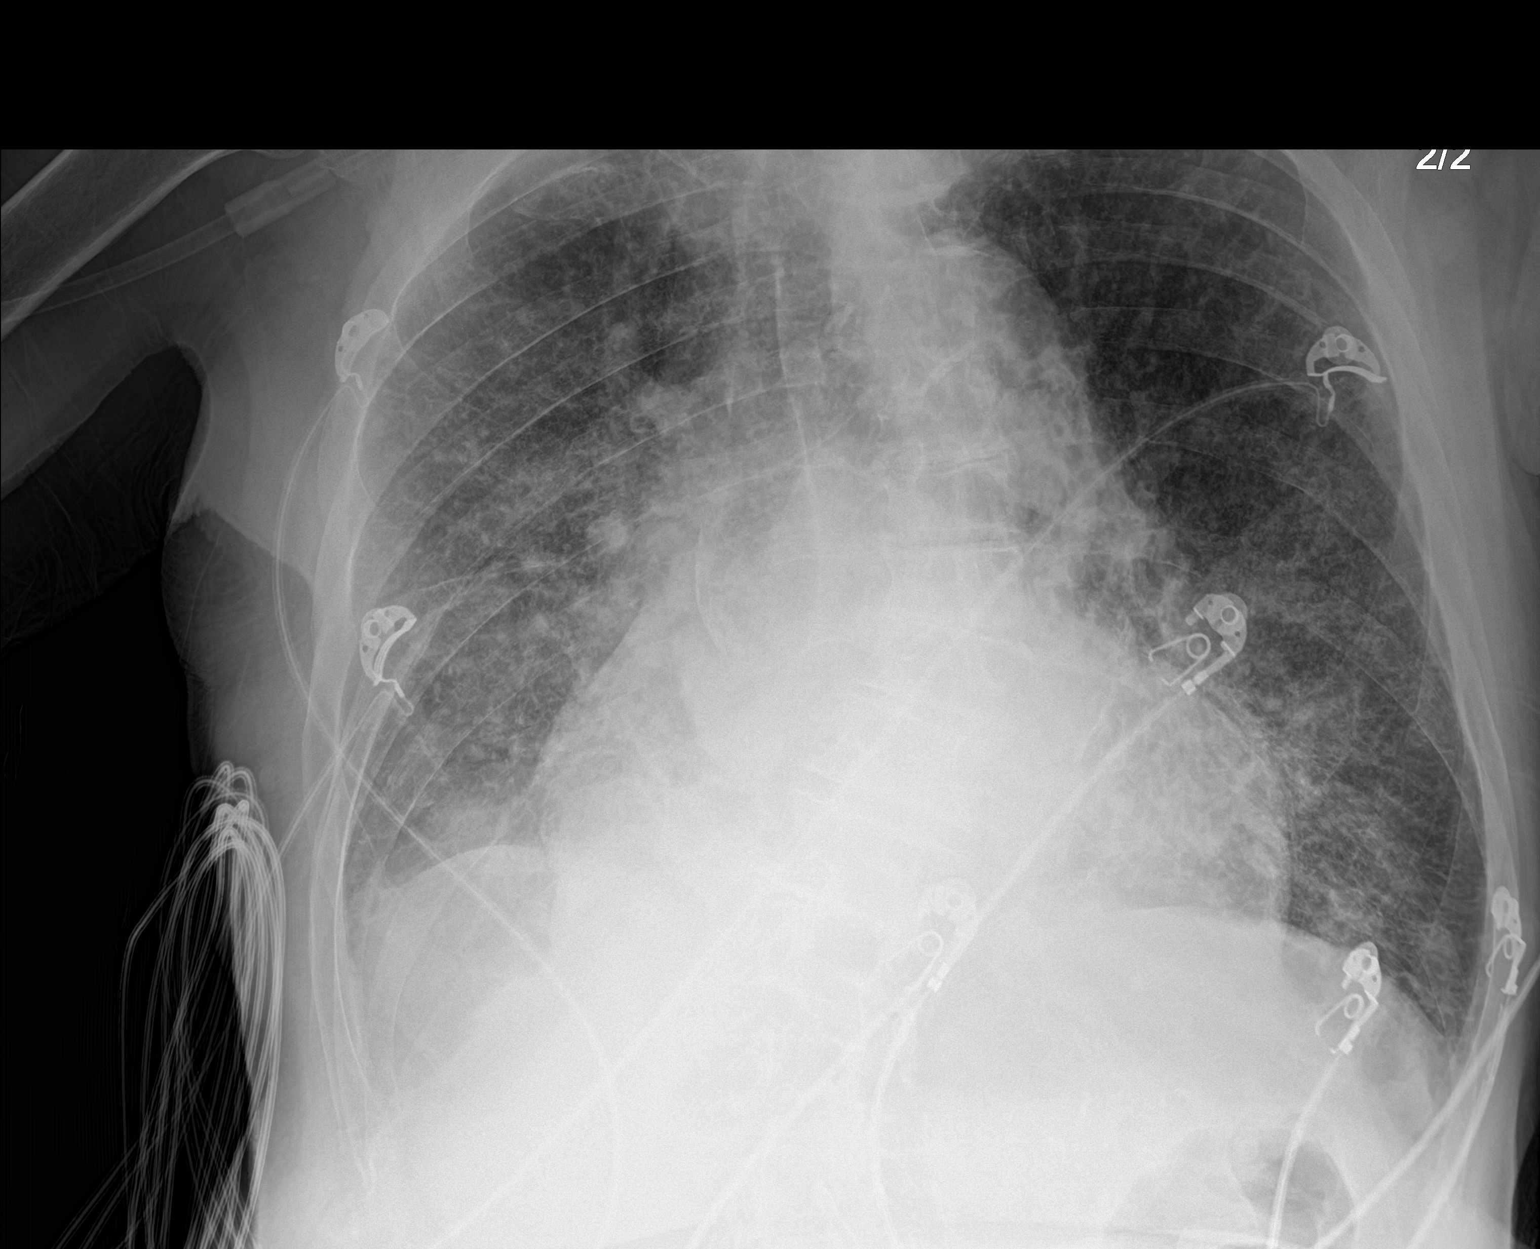

[2 of 2 positions shown; findings below may reference images not displayed]

FINDINGS: Unchanged cardiomegaly with aortic atherosclerosis and tortuosity.
Progression in reticular opacities consistent with pulmonary edema.
Blunting of the costophrenic angles appears similar to prior exam.
No new airspace disease or pneumothorax. Unchanged osseous
structures with significant scoliotic curvature.
IMPRESSION: Progressive pulmonary edema since last month. Unchanged cardiomegaly
and aortic atherosclerosis. Aortic Atherosclerosis ([id number]-QT8.8).
